# Patient Record
Sex: Female | Born: 1965
Health system: Southern US, Community
[De-identification: ages and names within clinical notes are randomized; demographics above are authoritative.]

## PROBLEM LIST (undated history)

## (undated) DIAGNOSIS — I10 Essential (primary) hypertension: Secondary | ICD-10-CM

## (undated) DIAGNOSIS — R011 Cardiac murmur, unspecified: Secondary | ICD-10-CM

## (undated) DIAGNOSIS — I499 Cardiac arrhythmia, unspecified: Secondary | ICD-10-CM

## (undated) HISTORY — PX: FACIAL RECONSTRUCTION SURGERY: SHX631

## (undated) HISTORY — DX: Essential (primary) hypertension: I10

## (undated) HISTORY — DX: Cardiac arrhythmia, unspecified: I49.9

## (undated) HISTORY — PX: CHOLECYSTECTOMY: SHX55

## (undated) HISTORY — PX: APPENDECTOMY: SHX54

## (undated) HISTORY — DX: Cardiac murmur, unspecified: R01.1

## (undated) HISTORY — PX: TONSILLECTOMY: SUR1361

---

## 1998-07-19 ENCOUNTER — Ambulatory Visit (HOSPITAL_BASED_OUTPATIENT_CLINIC_OR_DEPARTMENT_OTHER): Admission: RE | Admit: 1998-07-19 | Discharge: 1998-07-19 | Payer: Self-pay | Admitting: Otolaryngology

## 1998-11-04 ENCOUNTER — Emergency Department (HOSPITAL_COMMUNITY): Admission: EM | Admit: 1998-11-04 | Discharge: 1998-11-04 | Payer: Self-pay | Admitting: Emergency Medicine

## 1998-11-24 ENCOUNTER — Ambulatory Visit (HOSPITAL_COMMUNITY): Admission: RE | Admit: 1998-11-24 | Discharge: 1998-11-24 | Payer: Self-pay | Admitting: Internal Medicine

## 1998-11-24 ENCOUNTER — Encounter: Payer: Self-pay | Admitting: Internal Medicine

## 1999-01-12 ENCOUNTER — Ambulatory Visit (HOSPITAL_BASED_OUTPATIENT_CLINIC_OR_DEPARTMENT_OTHER): Admission: RE | Admit: 1999-01-12 | Discharge: 1999-01-12 | Payer: Self-pay | Admitting: Otolaryngology

## 2001-01-16 ENCOUNTER — Encounter: Payer: Self-pay | Admitting: Emergency Medicine

## 2001-01-16 ENCOUNTER — Emergency Department (HOSPITAL_COMMUNITY): Admission: EM | Admit: 2001-01-16 | Discharge: 2001-01-17 | Payer: Self-pay | Admitting: Emergency Medicine

## 2002-11-17 ENCOUNTER — Other Ambulatory Visit: Admission: RE | Admit: 2002-11-17 | Discharge: 2002-11-17 | Payer: Self-pay | Admitting: Internal Medicine

## 2009-10-29 ENCOUNTER — Encounter: Payer: Self-pay | Admitting: Internal Medicine

## 2009-10-29 LAB — CONVERTED CEMR LAB: Pap Smear: NORMAL

## 2010-02-08 LAB — HM PAP SMEAR: HM Pap smear: NORMAL

## 2010-12-19 ENCOUNTER — Other Ambulatory Visit: Payer: Self-pay | Admitting: Internal Medicine

## 2010-12-19 ENCOUNTER — Encounter: Payer: Self-pay | Admitting: Internal Medicine

## 2010-12-19 ENCOUNTER — Ambulatory Visit (INDEPENDENT_AMBULATORY_CARE_PROVIDER_SITE_OTHER)
Admission: RE | Admit: 2010-12-19 | Discharge: 2010-12-19 | Disposition: A | Discharge status: Home / Self Care | Payer: BC Managed Care – PPO | Source: Ambulatory Visit | Attending: Internal Medicine | Admitting: Internal Medicine

## 2010-12-19 ENCOUNTER — Ambulatory Visit (INDEPENDENT_AMBULATORY_CARE_PROVIDER_SITE_OTHER): Payer: BC Managed Care – PPO | Admitting: Internal Medicine

## 2010-12-19 ENCOUNTER — Other Ambulatory Visit: Payer: BC Managed Care – PPO

## 2010-12-19 ENCOUNTER — Encounter (INDEPENDENT_AMBULATORY_CARE_PROVIDER_SITE_OTHER): Payer: Self-pay | Admitting: *Deleted

## 2010-12-19 DIAGNOSIS — R0609 Other forms of dyspnea: Secondary | ICD-10-CM | POA: Insufficient documentation

## 2010-12-19 DIAGNOSIS — J309 Allergic rhinitis, unspecified: Secondary | ICD-10-CM

## 2010-12-19 DIAGNOSIS — G4730 Sleep apnea, unspecified: Secondary | ICD-10-CM

## 2010-12-19 DIAGNOSIS — R0989 Other specified symptoms and signs involving the circulatory and respiratory systems: Secondary | ICD-10-CM

## 2010-12-19 DIAGNOSIS — R740 Nonspecific elevation of levels of transaminase and lactic acid dehydrogenase [LDH]: Secondary | ICD-10-CM

## 2010-12-19 DIAGNOSIS — I10 Essential (primary) hypertension: Secondary | ICD-10-CM

## 2010-12-19 LAB — CBC WITH DIFFERENTIAL/PLATELET
Basophils Absolute: 0.1 10*3/uL (ref 0.0–0.1)
Basophils Relative: 1.1 % (ref 0.0–3.0)
Eosinophils Absolute: 0.2 10*3/uL (ref 0.0–0.7)
Eosinophils Relative: 2.5 % (ref 0.0–5.0)
HCT: 41.7 % (ref 36.0–46.0)
Hemoglobin: 14.4 g/dL (ref 12.0–15.0)
Lymphocytes Relative: 27.1 % (ref 12.0–46.0)
Lymphs Abs: 2.1 10*3/uL (ref 0.7–4.0)
MCHC: 34.4 g/dL (ref 30.0–36.0)
MCV: 86.3 fl (ref 78.0–100.0)
Monocytes Absolute: 0.5 10*3/uL (ref 0.1–1.0)
Monocytes Relative: 7 % (ref 3.0–12.0)
Neutro Abs: 4.7 10*3/uL (ref 1.4–7.7)
Neutrophils Relative %: 62.3 % (ref 43.0–77.0)
Platelets: 411 10*3/uL — ABNORMAL HIGH (ref 150.0–400.0)
RBC: 4.83 Mil/uL (ref 3.87–5.11)
RDW: 13.5 % (ref 11.5–14.6)
WBC: 7.6 10*3/uL (ref 4.5–10.5)

## 2010-12-19 LAB — BRAIN NATRIURETIC PEPTIDE: Pro B Natriuretic peptide (BNP): 17.2 pg/mL (ref 0.0–100.0)

## 2010-12-19 LAB — URINALYSIS, ROUTINE W REFLEX MICROSCOPIC
Ketones, ur: NEGATIVE
Leukocytes, UA: NEGATIVE
Nitrite: NEGATIVE
Specific Gravity, Urine: <=1.005 (ref 1.000–1.030)
pH: 7 (ref 5.0–8.0)

## 2010-12-19 LAB — CARDIAC PANEL
CK-MB: 1.5 ng/mL (ref 0.3–4.0)
Total CK: 73 U/L (ref 7–177)

## 2010-12-19 LAB — BASIC METABOLIC PANEL
BUN: 7 mg/dL (ref 6–23)
CO2: 26 mEq/L (ref 19–32)
Calcium: 9 mg/dL (ref 8.4–10.5)
Chloride: 107 mEq/L (ref 96–112)
Creatinine, Ser: 0.8 mg/dL (ref 0.4–1.2)
GFR: 87.15 mL/min (ref >60.00)
Glucose, Bld: 102 mg/dL — ABNORMAL HIGH (ref 70–99)
Potassium: 4 mEq/L (ref 3.5–5.1)
Sodium: 140 mEq/L (ref 135–145)

## 2010-12-19 LAB — HEPATIC FUNCTION PANEL
ALT: 15 U/L (ref 0–35)
AST: 19 U/L (ref 0–37)
Albumin: 3.7 g/dL (ref 3.5–5.2)
Alkaline Phosphatase: 82 U/L (ref 39–117)
Bilirubin, Direct: 0.2 mg/dL (ref 0.0–0.3)
Total Bilirubin: 0.4 mg/dL (ref 0.3–1.2)
Total Protein: 6.8 g/dL (ref 6.0–8.3)

## 2010-12-19 LAB — TSH: TSH: 5.53 u[IU]/mL — ABNORMAL HIGH (ref 0.35–5.50)

## 2010-12-19 LAB — LIPID PANEL
Cholesterol: 171 mg/dL (ref 0–200)
HDL: 40.6 mg/dL (ref >39.00)
LDL Cholesterol: 101 mg/dL — ABNORMAL HIGH (ref 0–99)
Total CHOL/HDL Ratio: 4
Triglycerides: 147 mg/dL (ref 0.0–149.0)
VLDL: 29.4 mg/dL (ref 0.0–40.0)

## 2010-12-20 ENCOUNTER — Encounter: Payer: Self-pay | Admitting: Internal Medicine

## 2010-12-26 NOTE — Letter (Signed)
Summary: Lipid Letter  [facility]  [address] [place], [place] [address]   Phone: [phone]  Fax: [phone]    12/20/2010  Sarah Watts [address] [place], [place]  [address]  Dear Sarah Watts:  We have carefully reviewed your last lipid profile from 12/19/2010 and the results are noted below with a summary of recommendations for lipid management.    Cholesterol:       171     Goal: <200   HDL "good" Cholesterol:   57.84     Goal: >50   LDL "bad" Cholesterol:   101     Goal: <130   Triglycerides:       147.0     Goal: <150    your thyroid level is slightly low, your platelet count is a little high but the other labs look good. the chest xray was normal. please follow-up soon.    TLC Diet (Therapeutic Lifestyle Change): Saturated Fats & Transfatty acids should be kept < 7% of total calories ***Reduce Saturated Fats Polyunstaurated Fat can be up to 10% of total calories Monounsaturated Fat Fat can be up to 20% of total calories Total Fat should be no greater than 25-35% of total calories Carbohydrates should be 50-60% of total calories Protein should be approximately 15% of total calories Fiber should be at least 20-30 grams a day ***Increased fiber may help lower LDL Total Cholesterol should be < 200mg /day Consider adding plant stanol/sterols to diet (example: Benacol spread) ***A higher intake of unsaturated fat may reduce Triglycerides and Increase HDL    Adjunctive Measures (may lower LIPIDS and reduce risk of Heart Attack) include: Aerobic Exercise (20-30 minutes 3-4 times a week) Limit Alcohol Consumption Weight Reduction Aspirin 75-81 mg a day by mouth (if not allergic or contraindicated) Dietary Fiber 20-30 grams a day by mouth     Current Medications: 1)    Flonase 50 Mcg/act Susp (Fluticasone propionate) .... 2 squirts to each nostril once daily 2)    Claritin 10 Mg Tabs (Loratadine) .... Take 1 tab once daily 3)    Tribenzor  40-5-12.5 Mg Tabs (Olmesartan-amlodipine-hctz) .... One by mouth once daily for high blood pressure  If you have any questions, please call. We appreciate being able to work with you.   Sincerely,    [facility]

## 2010-12-26 NOTE — Assessment & Plan Note (Signed)
Summary: New   Vital Signs:  Patient profile:   45 year old female Menstrual status:  regular LMP:     12/16/2010 Height:      59 inches (149.86 cm) Weight:      168.50 pounds (76.59 kg) BMI:     34.16 O2 Sat:      97 % on Room air Temp:     98.1 degrees F (36.72 degrees C) oral Pulse rate:   87 / minute Pulse rhythm:   regular Resp:     16 per minute BP supine:   180 / 92  (right arm) BP sitting:   178 / 98  (left arm) Cuff size:   regular  Vitals Entered By: Rock Nephew CMA (December 19, 2010 9:15 AM)  Nutrition Counseling: Patient's BMI is greater than 25 and therefore counseled on weight management options.  O2 Flow:  Room air CC: NP..SOB w/exhertion..Needs labs failed to get after gallbladder Sx/sls, cma, Hypertension Management, Preventive Care Is Patient Diabetic? No LMP (date): 12/16/2010     Menstrual Status regular Enter LMP: 12/16/2010 Last PAP Result normal   Primary Care Provider:  Yetta Barre  CC:  NP..SOB w/exhertion..Needs labs failed to get after gallbladder Sx/sls, cma, Hypertension Management, and Preventive Care.  History of Present Illness: New to me she tells me that she has had some DOE for more than one year.  Her BP has not been well controlled.  She was told that she had abnormal liver enzymes one year ago - no old records are available today.  Hypertension History:      She complains of dyspnea with exertion and orthopnea, but denies headache, chest pain, palpitations, PND, peripheral edema, visual symptoms, neurologic problems, syncope, and side effects from treatment.  She notes no problems with any antihypertensive medication side effects.        Positive major cardiovascular risk factors include hypertension.  Negative major cardiovascular risk factors include female age less than 54 years old, no history of diabetes or hyperlipidemia, negative family history for ischemic heart disease, and non-tobacco-user status.        Further assessment  for target organ damage reveals no history of ASHD, cardiac end-organ damage (CHF/LVH), stroke/TIA, peripheral vascular disease, renal insufficiency, or hypertensive retinopathy.     Preventive Screening-Counseling & Management  Alcohol-Tobacco     Alcohol drinks/day: 0     Alcohol Counseling: not indicated; patient does not drink     Smoking Status: quit     Smoking Cessation Counseling: yes     Smoke Cessation Stage: quit     Packs/Day: 0.5     Year Started: 1982     Year Quit: 2011     Pack years: 15     Tobacco Counseling: to remain off tobacco products  Caffeine-Diet-Exercise     Does Patient Exercise: no  Hep-HIV-STD-Contraception     Hepatitis Risk: no risk noted     HIV Risk: no risk noted     STD Risk: no risk noted      Sexual History:  currently monogamous and same sex encounters.        Drug Use:  no.        Blood Transfusions:  no.    Clinical Review Panels:  Prevention   Last Mammogram:  normal (07/29/2010)   Last Pap Smear:  normal (10/29/2009)   Medications Prior to Update: 1)  None  Current Medications (verified): 1)  Flonase 50 Mcg/act Susp (Fluticasone Propionate) .... 2 Squirts To  Each Nostril Once Daily 2)  Claritin 10 Mg Tabs (Loratadine) .... Take 1 Tab Once Daily 3)  Tribenzor 40-5-12.5 Mg Tabs (Olmesartan-Amlodipine-Hctz) .... One By Mouth Once Daily For High Blood Pressure  Allergies (verified): 1)  ! 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  The patient denies anorexia, fever, weight loss, chest pain, syncope, peripheral edema, prolonged cough, headaches, hemoptysis, abdominal pain, melena, hematochezia, severe indigestion/heartburn, hematuria, muscle weakness, suspicious skin lesions, difficulty walking, depression, unusual weight change, abnormal bleeding, and enlarged lymph nodes.   Resp:  Complains of excessive snoring, hypersomnolence, morning headaches, and shortness of breath; denies chest discomfort, chest pain with inspiration, cough, coughing up blood, pleuritic, sputum productive, and wheezing. GI:  Denies abdominal pain, bloody stools, change in bowel habits, constipation, diarrhea, indigestion, loss of appetite, nausea, vomiting, vomiting blood, and yellowish skin color.  Physical Exam  General:  alert, well-developed, well-nourished, well-hydrated, appropriate dress, normal appearance, healthy-appearing, cooperative to examination, good hygiene, and overweight-appearing.   Head:  normocephalic, atraumatic, no abnormalities observed, and no abnormalities palpated.   Eyes:  vision grossly intact, pupils equal, pupils round, and a-v nicking.   Ears:  R ear normal and L ear normal.   Nose:  External nasal examination shows no deformity or inflammation. Nasal mucosa are pink and moist without lesions or exudates. Mouth:  Oral mucosa and oropharynx without lesions or exudates.  Teeth in good repair. Neck:  supple, full ROM, no masses, no thyromegaly, no thyroid nodules or tenderness, no JVD, normal carotid upstroke, no carotid bruits, no cervical lymphadenopathy, and no neck tenderness.   Lungs:  normal respiratory  effort, no intercostal retractions, no accessory muscle use, normal breath sounds, no dullness, no fremitus, no crackles, and no wheezes.   Heart:  normal rate, regular rhythm, no murmur, no gallop, no rub, and no JVD.   Abdomen:  soft, non-tender, normal bowel sounds, no distention, no masses, no guarding, no rigidity, no rebound tenderness, no abdominal hernia, no inguinal hernia, no hepatomegaly, no splenomegaly, and abdominal scar(s).   Msk:  No deformity or scoliosis noted of thoracic or lumbar spine.   Pulses:  R and L carotid,radial,femoral,dorsalis pedis and posterior tibial pulses are full and equal bilaterally Extremities:  No clubbing, cyanosis, edema, or deformity noted with normal full range of motion of all joints.   Neurologic:  No cranial nerve deficits noted. Station and gait are normal. Plantar reflexes are down-going bilaterally. DTRs are symmetrical throughout. Sensory, motor and coordinative functions appear intact. Skin:  Intact without suspicious lesions or rashes Cervical Nodes:  No lymphadenopathy noted Axillary Nodes:  No palpable lymphadenopathy Inguinal Nodes:  No significant adenopathy Psych:  Cognition and judgment appear intact. Alert and cooperative with normal attention span and concentration.  No apparent delusions, illusions, hallucinations Additional Exam:  Her EKG shows mild LAE and some PAC's but no Q waves, LVH, or abnormal ST/T waves.   Impression & Recommendations:  Problem # 1:  SLEEP APNEA (ICD-780.57) Assessment New  Orders: Sleep Disorder Referral (Sleep Disorder) EKG w/ Interpretation (93000)  Problem # 2:  HYPERTENSION (ICD-401.9) Assessment: Deteriorated  The following medications were removed from the medication list:    Amlodipine Besylate 10 Mg Tabs (Amlodipine besylate) .Marland Kitchen... Take 1 tab once daily Her updated medication list for this problem includes:    Tribenzor 40-5-12.5 Mg Tabs (Olmesartan-amlodipine-hctz) ..... One by mouth once  daily for high blood pressure  Orders: Venipuncture (78295) TLB-Lipid Panel (80061-LIPID) TLB-BMP (Basic Metabolic Panel-BMET) (80048-METABOL) TLB-CBC Platelet - w/Differential (85025-CBCD) TLB-Hepatic/Liver Function Pnl (80076-HEPATIC) TLB-TSH (Thyroid Stimulating Hormone) (84443-TSH) TLB-BNP (B-Natriuretic Peptide) (83880-BNPR) TLB-Cardiac Panel (62130_86578-IONG) TLB-Udip w/ Micro (81001-URINE) EKG w/ Interpretation (93000)  BP today: 178/98  10 Yr Risk Heart Disease: Not enough information  Problem # 3:  DYSPNEA ON EXERTION (ICD-786.09) Assessment: New will look for lung pathology, organic disease, CHF, etc Her updated medication list for this problem includes:    Tribenzor 40-5-12.5 Mg Tabs (Olmesartan-amlodipine-hctz) ..... One by mouth once daily for high blood pressure  Orders: Venipuncture (29528) TLB-Lipid Panel (80061-LIPID) TLB-BMP (Basic Metabolic Panel-BMET) (80048-METABOL) TLB-CBC Platelet - w/Differential (85025-CBCD) TLB-Hepatic/Liver Function Pnl (80076-HEPATIC) TLB-TSH (Thyroid Stimulating Hormone) (84443-TSH) TLB-BNP (B-Natriuretic Peptide) (83880-BNPR) TLB-Cardiac Panel (41324_40102-VOZD) TLB-Udip w/ Micro (81001-URINE) T-2 View CXR (71020TC) EKG w/ Interpretation (93000)  Problem # 4:  TRANSAMINASES, SERUM, ELEVATED (ICD-790.4) Assessment: Unchanged  Orders: Venipuncture (66440) TLB-Lipid Panel (80061-LIPID) TLB-BMP (Basic Metabolic Panel-BMET) (80048-METABOL) TLB-CBC Platelet - w/Differential (85025-CBCD) TLB-Hepatic/Liver Function Pnl (80076-HEPATIC) TLB-TSH (Thyroid Stimulating Hormone) (84443-TSH) TLB-BNP (B-Natriuretic Peptide) (83880-BNPR) TLB-Cardiac Panel (34742_59563-OVFI) TLB-Udip w/ Micro (81001-URINE) T-Hepatitis C Antibody (43329-51884)  Complete Medication List: 1)  Flonase 50 Mcg/act Susp (Fluticasone propionate) .... 2 squirts to each nostril once daily 2)  Claritin 10 Mg Tabs (Loratadine) .... Take 1 tab once daily 3)   Tribenzor 40-5-12.5 Mg Tabs (Olmesartan-amlodipine-hctz) .... One by mouth once daily for high blood pressure  Hypertension Assessment/Plan:      The patient's hypertensive risk group is category A: No risk factors and no target organ damage.  Today's blood pressure is 178/98.  Her blood pressure goal is < 140/90.  PAP Screening:    Last PAP smear:  10/29/2009    Reviewed PAP smear recommendations:  patient refuses understanding risks of delayed diagnosis  Mammogram Screening:    Last Mammogram:  07/29/2010  Osteoporosis Risk Assessment:  Risk Factors for Fracture or Low Bone Density:   Race (White or Asian):     yes   Smoking status:       quit  Patient Instructions: 1)  Please schedule a follow-up appointment in 1 month. 2)  It is important that you exercise regularly at least 20 minutes 5 times a week. If you develop chest pain, have severe difficulty breathing, or feel very tired , stop exercising immediately and seek medical attention. 3)  You need to lose weight. Consider a lower calorie diet and regular exercise.  4)  Check your Blood Pressure regularly. If it is above 140/90: you should make an appointment. Prescriptions: TRIBENZOR 40-5-12.5 MG TABS (OLMESARTAN-AMLODIPINE-HCTZ) One by mouth once daily for high blood pressure  #56 x 0   Entered and Authorized by:   Etta Grandchild MD   Signed by:   Etta Grandchild MD on 12/19/2010   Method  used:   Samples Given   RxID:   [id number]    Orders Added: 1)  Venipuncture [36415] 2)  TLB-Lipid Panel [80061-LIPID] 3)  TLB-BMP (Basic Metabolic Panel-BMET) [80048-METABOL] 4)  TLB-CBC Platelet - w/Differential [85025-CBCD] 5)  TLB-Hepatic/Liver Function Pnl [80076-HEPATIC] 6)  TLB-TSH (Thyroid Stimulating Hormone) [84443-TSH] 7)  TLB-BNP (B-Natriuretic Peptide) [83880-BNPR] 8)  TLB-Cardiac Panel [82550_82553-CARD] 9)  TLB-Udip w/ Micro [81001-URINE] 10)  T-Hepatitis C Antibody [86578-46962] 11)  Sleep Disorder Referral  [Sleep Disorder] 12)  T-2 View CXR [71020TC] 13)  EKG w/ Interpretation [93000] 14)  New Patient Level V [99205]     Preventive Care Screening  Mammogram:    Date:  07/29/2010    Results:  normal   Pap Smear:    Date:  10/29/2009    Results:  normal

## 2010-12-26 NOTE — Letter (Signed)
Summary: [facility]  [address] [place], [place] [address]   Phone: [phone]  Fax: [phone]    Today's Date: December 19, 2010  Name of Patient: Sarah Watts  The above named patient had a medical visit today at:   9 am .  Please take this into consideration when reviewing the time away from work/school.    Special Instructions:  [ X ] None  [  ] To be off the remainder of today, returning to the normal work / school schedule tomorrow.  [  ] To be off until the next scheduled appointment on ______________________.  [  ] Other ________________________________________________________________ ________________________________________________________________________   Sincerely yours,   Sanda Linger MD

## 2011-01-09 ENCOUNTER — Institutional Professional Consult (permissible substitution) (INDEPENDENT_AMBULATORY_CARE_PROVIDER_SITE_OTHER): Payer: BC Managed Care – PPO | Admitting: Pulmonary Disease

## 2011-01-09 ENCOUNTER — Encounter: Payer: Self-pay | Admitting: Pulmonary Disease

## 2011-01-09 DIAGNOSIS — G4733 Obstructive sleep apnea (adult) (pediatric): Secondary | ICD-10-CM | POA: Insufficient documentation

## 2011-01-18 ENCOUNTER — Encounter: Payer: Self-pay | Admitting: Internal Medicine

## 2011-01-18 ENCOUNTER — Other Ambulatory Visit: Payer: Self-pay | Admitting: *Deleted

## 2011-01-18 ENCOUNTER — Other Ambulatory Visit (INDEPENDENT_AMBULATORY_CARE_PROVIDER_SITE_OTHER): Payer: BC Managed Care – PPO

## 2011-01-18 ENCOUNTER — Ambulatory Visit (INDEPENDENT_AMBULATORY_CARE_PROVIDER_SITE_OTHER): Payer: BC Managed Care – PPO | Admitting: Internal Medicine

## 2011-01-18 VITALS — BP 148/90 | HR 96 | Temp 98.0°F | Resp 14 | Ht 59.0 in | Wt 166.0 lb

## 2011-01-18 DIAGNOSIS — J209 Acute bronchitis, unspecified: Secondary | ICD-10-CM | POA: Insufficient documentation

## 2011-01-18 DIAGNOSIS — E039 Hypothyroidism, unspecified: Secondary | ICD-10-CM

## 2011-01-18 DIAGNOSIS — I10 Essential (primary) hypertension: Secondary | ICD-10-CM

## 2011-01-18 LAB — TSH: TSH: 4.17 u[IU]/mL (ref 0.35–5.50)

## 2011-01-18 MED ORDER — MOXIFLOXACIN HCL 400 MG PO TABS: 400.0000 mg | ORAL_TABLET | Freq: Every day | ORAL | Status: AC

## 2011-01-18 MED ORDER — HYDROCOD POLST-CPM POLST ER 10-8 MG PO CP12: 1.0000 | ORAL_CAPSULE | Freq: Two times a day (BID) | ORAL | Status: DC | PRN

## 2011-01-18 MED ORDER — PROMETHAZINE-DM 6.25-15 MG/5ML PO SYRP: 5.0000 mL | ORAL_SOLUTION | Freq: Four times a day (QID) | ORAL | Status: DC | PRN

## 2011-01-18 NOTE — Telephone Encounter (Signed)
She is allergic to codeine

## 2011-01-18 NOTE — Patient Instructions (Signed)
Bronchitis Bronchitis is the body's way of reacting to injury and/or infection (inflammation) of the bronchi. Bronchi are the air tubes that extend from the windpipe into the lungs. If the inflammation becomes severe, it may cause shortness of breath.  CAUSES Inflammation may be caused by:  A virus.   Germs (bacteria).   Dust.   Allergens.   Pollutants and many other irritants.  The cells lining the bronchial tree are covered with tiny hairs (cilia). These constantly beat upward, away from the lungs, toward the mouth. This keeps the lungs free of pollutants. When these cells become too irritated and are unable to do their job, mucus begins to develop. This causes the characteristic cough of bronchitis. The cough clears the lungs when the cilia are unable to do their job. Without either of these protective mechanisms, the mucus would settle in the lungs. Then you would develop pneumonia. Smoking is a common cause of bronchitis and can contribute to pneumonia. Stopping this habit is the single most important thing you can do to help yourself. TREATMENT  Your caregiver may prescribe an antibiotic if the cough is caused by bacteria. Also, medicines that open up your airways make it easier to breathe. Your caregiver may also recommend or prescribe an expectorant. It will loosen the mucus to be coughed up. Only take over-the-counter or prescription medicines for pain, discomfort, or fever as directed by your caregiver.   Removing whatever causes the problem (smoking, for example) is critical to preventing the problem from getting worse.   Cough suppressants may be prescribed for relief of cough symptoms.   Inhaled medicines may be prescribed to help with symptoms now and to help prevent problems from returning.   For those with recurrent (chronic) bronchitis, there may be a need for steroid medicines.  SEEK IMMEDIATE MEDICAL CARE IF:  During treatment, you develop more pus-like mucus  (purulent sputum).   You or your child has an oral temperature above 100.5, not controlled by medicine.   Your baby is older than 3 months with a rectal temperature of 102 F (38.9 C) or higher.   Your baby is 3 months old or younger with a rectal temperature of 100.4 F (38 C) or higher.   You become progressively more ill.   You have increased difficulty breathing, wheezing, or shortness of breath.  It is necessary to seek immediate medical care if you are elderly or sick from any other disease. MAKE SURE YOU:  Understand these instructions.   Will watch your condition.   Will get help right away if you are not doing well or get worse.  Document Released: 10/15/2005 Document Re-Released: 01/09/2010 [employer] Patient Information 2011 [employer]. 

## 2011-01-18 NOTE — Telephone Encounter (Signed)
Cough med given today is too expensive. Pharm suggested cheritussin AC #4oz as alt. OK?

## 2011-01-18 NOTE — Progress Notes (Deleted)
  Subjective:    Patient ID: Sarah Watts, female    DOB: 18-Jul-1966, 45 y.o.   MRN: [id number]  HPI    Review of Systems     Objective:   Physical Exam        Assessment & Plan:

## 2011-01-18 NOTE — Progress Notes (Signed)
Subjective:     Sarah Watts is a 45 y.o. female who presents for evaluation of symptoms of a URI. Symptoms include congestion, cough described as productive, no  fever, post nasal drip, productive cough with  yellow colored sputum and purulent nasal discharge. Onset of symptoms was 7 days ago, and has been unchanged since that time. Treatment to date: decongestants.  The following portions of the patient's history were reviewed and updated as appropriate: allergies, current medications, past family history, past medical history, past social history, past surgical history and problem list.  Review of Systems Constitutional: negative for anorexia, chills, fatigue, fevers, malaise, night sweats, sweats and weight loss Respiratory: negative for dyspnea on exertion, hemoptysis, pleurisy/chest pain, stridor and wheezing  Past Medical History  Diagnosis Date  . Allergic rhinitis   . HTN (hypertension)   . Heart murmur   . Irregular heartbeat    Past Surgical History  Procedure Date  . Appendectomy   . Cholecystectomy   . Tonsillectomy   . Cesarean section   . Facial reconstruction surgery     Left, due to birth defect    does not have a smoking history on file. She does not have any smokeless tobacco history on file. She reports that she does not drink alcohol or use illicit drugs. family history includes Diabetes in her other; Hypertension in her other; and Prostate cancer in her other. Allergies  Allergen Reactions  . Codeine     REACTION: tachycardia  . Shellfish Allergy    Objective:    BP 148/90  Pulse 96  Temp(Src) 98 F (36.7 C) (Oral)  Resp 14  Ht 4\' 11"  (1.499 m)  Wt 166 lb (75.297 kg)  BMI 33.53 kg/m2  SpO2 98%  General Appearance:    Alert, cooperative, no distress, appears stated age  Head:    Normocephalic, without obvious abnormality, atraumatic  Eyes:    PERRL, conjunctiva/corneas clear, EOM's intact, fundi    benign, both eyes  Ears:    Normal TM's and  external ear canals, both ears  Nose:   Nares normal, septum midline, mucosa normal, no drainage    or sinus tenderness  Throat:   Lips, mucosa, and tongue normal; teeth and gums normal  Neck:   Supple, symmetrical, trachea midline, no adenopathy;    thyroid:  no enlargement/tenderness/nodules; no carotid   bruit or JVD  Back:     Symmetric, no curvature, ROM normal, no CVA tenderness  Lungs:     Clear to auscultation bilaterally, respirations unlabored  Chest Wall:    No tenderness or deformity   Heart:    Regular rate and rhythm, S1 and S2 normal, no murmur, rub   or gallop  Breast Exam:    No tenderness, masses, or nipple abnormality  Abdomen:     Soft, non-tender, bowel sounds active all four quadrants,    no masses, no organomegaly        Extremities:   Extremities normal, atraumatic, no cyanosis or edema  Pulses:   2+ and symmetric all extremities  Skin:   Skin color, texture, turgor normal, no rashes or lesions  Lymph nodes:   Cervical, supraclavicular, and axillary nodes normal  Neurologic:   CNII-XII intact, normal strength, sensation and reflexes    throughout    Lab Results  Component Value Date   TSH 5.53* 12/19/2010    Assessment:    bronchitis and viral upper respiratory illness  Her recent labs showed a slightly elevated TSH  so I will repeat that today  Plan:    Discussed diagnosis and treatment of URI. Suggested symptomatic OTC remedies. Nasal saline spray for congestion. Follow up as needed.  Started on avelox and tussicaps

## 2011-01-25 NOTE — Assessment & Plan Note (Signed)
Summary: consult for possible osa    Visit Type:  Initial Consult Copy to:  Sanda Linger Primary Provider/Referring Provider:  Sanda Linger  CC:  Sleep Consult.  Pt c/o snoring while sleeping and difficulty staying asleep. Marland Kitchen  History of Present Illness: The pt is a 44y/o female who I have been asked to see for possible osa.  Her history is significant for the following: +loud snoring, abnormal breathing pattern during sleep, restless sleep, nonrestorative sleep +sleep pressure while at work on breaks or working at [employer].  dozes with tv/movies. +sleepiness with long distance driving. weight up 20 pounds last 2 yrs, epworth score 7.   sleeps  1am to 10am  Medications Prior to Update: 1)  Flonase 50 Mcg/act Susp (Fluticasone Propionate) .... 2 Squirts To Each Nostril Once Daily 2)  Claritin 10 Mg Tabs (Loratadine) .... Take 1 Tab Once Daily 3)  Tribenzor 40-5-12.5 Mg Tabs (Olmesartan-Amlodipine-Hctz) .... One By Mouth Once Daily For High Blood Pressure  Allergies (verified): 1)  ! Codeine 2)  ! * Shellfish  Past History:  Past Medical History: Reviewed history from 12/19/2010 and no changes required. Allergic rhinitis Hypertension Heart murmur + irregular heartbeat  Past Surgical History: Appendectomy  1985 Cholecystectomy Feb 2011 Tonsillectomy  2003 Caesarean section  1985 left facial surgery for birth defect  2003  Family History: Reviewed history from 12/19/2010 and no changes required. Family History Diabetes 1st degree relative Family History Hypertension Family History of Prostate CA 1st degree relative <50 allergies: mother, father, sister asthma: sister  Social History: Reviewed history from 12/19/2010 and no changes required. Occupation: Arboriculturist at [employer] pt is single. pt has children. Domestic Partner - female  Elnita Maxwell pt is a former smoker.  started at age 33. 1/2 ppd.  quit Jan 2011. Alcohol use-no Drug use-no Regular exercise-no  Review  of Systems       The patient complains of shortness of breath with activity, irregular heartbeats, weight change, tooth/dental problems, nasal congestion/difficulty breathing through nose, and joint stiffness or pain.  The patient denies shortness of breath at rest, productive cough, non-productive cough, coughing up blood, chest pain, acid heartburn, indigestion, loss of appetite, abdominal pain, difficulty swallowing, sore throat, headaches, sneezing, itching, ear ache, anxiety, depression, hand/feet swelling, rash, change in color of mucus, and fever.    Vital Signs:  Patient profile:   45 year old female Menstrual status:  regular Height:      59 inches Weight:      168 pounds BMI:     34.05 O2 Sat:      98 % on Room air Temp:     98.3 degrees F oral Pulse rate:   82 / minute BP sitting:   122 / 68  (left arm) Cuff size:   regular  Vitals Entered By: Arman Filter LPN (January 09, 2011 10:46 AM)  O2 Flow:  Room air CC: Sleep Consult.  Pt c/o snoring while sleeping and difficulty staying asleep.  Comments Medications reviewed with patient Arman Filter LPN  January 09, 2011 10:50 AM    Physical Exam  General:  ow female in nad  Eyes:  PERRLA and EOMI.   Nose:  mild septal deviation to left with mild narrowing Mouth:  normal palate and uvula, no exudates Neck:  no jvd, tmg, LN Lungs:  clear to auscultation  Heart:  rrr, no mrg  Abdomen:  soft and nontender, bs+ Extremities:  no edema or cyanosis  pulses intact distally  Neurologic:  alert and oriented, moves all 4    Impression & Recommendations:  Problem # 1:  OBSTRUCTIVE SLEEP APNEA (ICD-327.23) the pt's history is very suggestive of clinically significant osa.  I have recommended a sleep study for evaluation, and she is agreeable.  Will set up for home sleep testing, and arrange f/u when results are available.    Other Orders: Consultation Level IV (29528) Misc. Referral (Misc. Ref)  Patient Instructions: 1)  will  schedule for home sleep testing to evaluate for sleep apnea 2)  work on weight loss 3)  will arrange followup once results are available.

## 2011-02-07 ENCOUNTER — Ambulatory Visit (HOSPITAL_BASED_OUTPATIENT_CLINIC_OR_DEPARTMENT_OTHER): Payer: BC Managed Care – PPO | Attending: Pulmonary Disease

## 2011-02-07 DIAGNOSIS — G4710 Hypersomnia, unspecified: Secondary | ICD-10-CM | POA: Insufficient documentation

## 2011-02-07 DIAGNOSIS — G4730 Sleep apnea, unspecified: Secondary | ICD-10-CM | POA: Insufficient documentation

## 2011-02-07 DIAGNOSIS — R0609 Other forms of dyspnea: Secondary | ICD-10-CM | POA: Insufficient documentation

## 2011-02-07 DIAGNOSIS — R0989 Other specified symptoms and signs involving the circulatory and respiratory systems: Secondary | ICD-10-CM | POA: Insufficient documentation

## 2011-02-12 ENCOUNTER — Other Ambulatory Visit: Payer: Self-pay | Admitting: *Deleted

## 2011-02-12 MED ORDER — OLMESARTAN-AMLODIPINE-HCTZ 40-5-12.5 MG PO TABS: 1.0000 | ORAL_TABLET | Freq: Every day | ORAL | Status: DC

## 2011-02-13 DIAGNOSIS — G4710 Hypersomnia, unspecified: Secondary | ICD-10-CM

## 2011-02-13 DIAGNOSIS — R0989 Other specified symptoms and signs involving the circulatory and respiratory systems: Secondary | ICD-10-CM

## 2011-02-13 DIAGNOSIS — R0609 Other forms of dyspnea: Secondary | ICD-10-CM

## 2011-02-13 DIAGNOSIS — G4730 Sleep apnea, unspecified: Secondary | ICD-10-CM

## 2011-02-14 ENCOUNTER — Telehealth: Payer: Self-pay | Admitting: Pulmonary Disease

## 2011-02-14 ENCOUNTER — Encounter: Payer: Self-pay | Admitting: Pulmonary Disease

## 2011-02-14 NOTE — Telephone Encounter (Signed)
[id number] 

## 2011-02-14 NOTE — Procedures (Signed)
[name], Sarah Watts              ACCOUNT NO.:  [id number]  MEDICAL RECORD NO.:  [id number]          PATIENT TYPE:  OUT  LOCATION:  SLEEP CENTER                 FACILITY:  [facility]  PHYSICIAN:  Barbaraann Share, MD,FCCPDATE OF BIRTH:  23-Oct-1965  DATE OF STUDY:  02/07/2011                           NOCTURNAL POLYSOMNOGRAM  REFERRING PHYSICIAN:  Gloria Lambertson M Cyle Kenyon  INDICATION FOR STUDY:  Hypersomnia with sleep apnea.  EPWORTH SLEEPINESS SCORE:  5.  MEDICATIONS:  SLEEP ARCHITECTURE:  The patient had a total sleep time of 198 minutes with very little slow wave sleep and only 32 minutes of REM.  Sleep onset latency was prolonged at 106 minutes, and REM onset was prolonged at 199 minutes.  Sleep efficiency was poor at 50%.  RESPIRATORY DATA:  The patient was found to have 9 apneas and no obstructive hypopneas for an apnea-hypopnea index of only 3 events per hour.  However, she was found to have large numbers of respiratory effort related arousals which resulted in significant disruption of sleep.  This gave her a respiratory disturbance index of 40 events per hour.  The events were worse in the supine position and there was moderate to loud snoring noted throughout.  OXYGEN DATA:  There was O2 desaturation as low as 93% with the patient's obstructive events.  CARDIAC DATA:  No clinically significant arrhythmias were noted.  MOVEMENT-PARASOMNIA:  The patient had no significant leg jerks or other abnormal behaviors seen.  IMPRESSIONS-RECOMMENDATIONS:  Small numbers of classic obstructive events which do not meet the AHI criteria for the obstructive sleep apnea syndrome.  However, the patient had large numbers of respiratory effort related arousals with significant sleep disruption and a respiratory disturbance index of 40 events per hour.  There was O2 desaturation as low as 93%.  Treatment for this degree of sleep disordered breathing can include a trial of weight loss alone,  upper airway surgery, dental appliance, and also CPAP.  Clinical correlation is suggested.     Barbaraann Share, MD,FCCP Diplomate, American Board of Sleep Medicine Electronically Signed    [facility]/MED[facility]  D:  02/13/2011 20:35:07  T:  02/14/2011 06:31:20  Job:  [id number]

## 2011-02-15 ENCOUNTER — Encounter: Payer: Self-pay | Admitting: Pulmonary Disease

## 2011-02-15 ENCOUNTER — Ambulatory Visit (INDEPENDENT_AMBULATORY_CARE_PROVIDER_SITE_OTHER): Payer: BC Managed Care – PPO | Admitting: Pulmonary Disease

## 2011-02-15 VITALS — BP 138/72 | HR 77 | Temp 97.9°F | Ht 60.0 in | Wt 168.0 lb

## 2011-02-15 DIAGNOSIS — G4733 Obstructive sleep apnea (adult) (pediatric): Secondary | ICD-10-CM

## 2011-02-15 NOTE — Assessment & Plan Note (Signed)
The pt has definite sleep disordered breathing by her sleep study that represents the upper airway resistance syndrome.  She is symptomatic, and would benefit from treatment to help with daytime symptoms.  I have discussed treatment options with her, including a trial of weight loss alone vs a trial of cpap or dental appliance while working on weight loss. She would like to try cpap if insurance approves.

## 2011-02-15 NOTE — Progress Notes (Signed)
  Subjective:    Patient ID: Sarah Watts, female    DOB: [birth year], 45 y.o.   MRN: [id number]  HPI The pt comes in today for f/u of her recent sleep study.  She was found to have an AHI 3/hr, but very large numbers of RERA's with definite sleep disruption for RDI 40/hr.  I have reviewed the study with her in detail, and answered all of her questions.    Review of Systems  Constitutional: Negative for fever and unexpected weight change.  HENT: Positive for congestion. Negative for ear pain, nosebleeds, sore throat, rhinorrhea, sneezing, trouble swallowing, dental problem, postnasal drip and sinus pressure.   Eyes: Negative for redness and itching.  Respiratory: Negative for cough, chest tightness, shortness of breath and wheezing.   Cardiovascular: Negative for palpitations and leg swelling.  Gastrointestinal: Negative for nausea and vomiting.  Genitourinary: Negative for dysuria.  Musculoskeletal: Negative for joint swelling.  Skin: Negative for rash.  Neurological: Negative for headaches.  Hematological: Does not bruise/bleed easily.  Psychiatric/Behavioral: Negative for dysphoric mood. The patient is not nervous/anxious.        Objective:   Physical Exam Obese female in nad LE without edema, no cyanosis  Appears mildly sleepy, oriented, moves all 4        Assessment & Plan:

## 2011-02-15 NOTE — Patient Instructions (Signed)
Will see if insurance will approve cpap trial for your sleep disordered breathing Work on weight loss If you go on the cpap, I need to see you 5 weeks after starting.  You will need to call and make apptm once getting cpap

## 2011-02-15 NOTE — Telephone Encounter (Signed)
Pt was seen today to discuss sleep study results.

## 2011-02-23 ENCOUNTER — Encounter: Payer: Self-pay | Admitting: Pulmonary Disease

## 2011-02-26 ENCOUNTER — Encounter: Payer: Self-pay | Admitting: Pulmonary Disease

## 2011-03-12 ENCOUNTER — Encounter: Payer: Self-pay | Admitting: Pulmonary Disease

## 2011-05-14 ENCOUNTER — Encounter: Payer: Self-pay | Admitting: Internal Medicine

## 2011-05-14 DIAGNOSIS — Z0000 Encounter for general adult medical examination without abnormal findings: Secondary | ICD-10-CM | POA: Insufficient documentation

## 2011-05-15 ENCOUNTER — Ambulatory Visit (INDEPENDENT_AMBULATORY_CARE_PROVIDER_SITE_OTHER): Payer: BC Managed Care – PPO | Admitting: Internal Medicine

## 2011-05-15 ENCOUNTER — Encounter: Payer: Self-pay | Admitting: Internal Medicine

## 2011-05-15 VITALS — BP 150/82 | HR 88 | Temp 98.5°F | Ht 60.0 in | Wt 168.4 lb

## 2011-05-15 DIAGNOSIS — J309 Allergic rhinitis, unspecified: Secondary | ICD-10-CM

## 2011-05-15 DIAGNOSIS — L03211 Cellulitis of face: Secondary | ICD-10-CM

## 2011-05-15 DIAGNOSIS — I10 Essential (primary) hypertension: Secondary | ICD-10-CM

## 2011-05-15 DIAGNOSIS — L0201 Cutaneous abscess of face: Secondary | ICD-10-CM

## 2011-05-15 MED ORDER — FLUTICASONE PROPIONATE 50 MCG/ACT NA SUSP: 2.0000 | Freq: Every day | NASAL | Status: DC

## 2011-05-15 MED ORDER — DOXYCYCLINE HYCLATE 100 MG PO TABS: 100.0000 mg | ORAL_TABLET | Freq: Two times a day (BID) | ORAL | Status: AC

## 2011-05-15 NOTE — Progress Notes (Signed)
Subjective:    Patient ID: Sarah Watts, female    DOB: Nov 27, 1965, 45 y.o.   MRN: [id number]  HPI  Here with acute onset 2 days left facial pain, swelling and tender with slight pus like d/c this am and some discomfort at the left angle of the jaw but no swelling;  ? Some low grade temp, but no high fever, chills, ST, cough, HA and Pt denies chest pain, increased sob or doe, wheezing, orthopnea, PND, increased LE swelling, palpitations, dizziness or syncope.  Pt denies new neurological symptoms such as new headache, or facial or extremity weakness or numbness   Pt denies polydipsia, polyuria.  Does have several wks ongoing nasal allergy symptoms with clear congestion, itch and sneeze, without fever, pain, ST, cough or wheezing, and imrpoved on current meds.  Overall good compliance with treatment, and good medicine tolerability. Past Medical History  Diagnosis Date  . Allergic rhinitis   . HTN (hypertension)   . Heart murmur   . Irregular heartbeat    Past Surgical History  Procedure Date  . Appendectomy   . Cholecystectomy   . Tonsillectomy   . Cesarean section   . Facial reconstruction surgery     Left, due to birth defect    reports that she quit smoking about 15 months ago. She does not have any smokeless tobacco history on file. She reports that she does not drink alcohol or use illicit drugs. family history includes Allergies in her father, mother, and sister; Asthma in her sister; Diabetes in her other; Hypertension in her other; and Prostate cancer in her other. Allergies  Allergen Reactions  . Codeine     REACTION: tachycardia  . Shellfish Allergy    Current Outpatient Prescriptions on File Prior to Visit  Medication Sig Dispense Refill  . fluticasone (FLONASE) 50 MCG/ACT nasal spray 2 sprays by Nasal route daily.        Marland Kitchen loratadine (CLARITIN) 10 MG tablet Take 10 mg by mouth daily.        . Olmesartan-Amlodipine-HCTZ (TRIBENZOR) 40-5-12.5 MG TABS Take 1 tablet by  mouth daily.  90 tablet  1  . promethazine-dextromethorphan (PROMETHAZINE-DM) 6.25-15 MG/5ML syrup Take 5 mLs by mouth 4 (four) times daily as needed for cough.  118 mL  1    Review of Systems Review of Systems  Constitutional: Negative for diaphoresis and unexpected weight change.  HENT: Negative for drooling and tinnitus.   Eyes: Negative for photophobia and visual disturbance.  Respiratory: Negative for choking and stridor.   Gastrointestinal: Negative for vomiting and blood in stool.      Objective:   Physical Exam BP 150/82  Pulse 88  Temp(Src) 98.5 F (36.9 C) (Oral)  Ht 5' (1.524 m)  Wt 168 lb 6 oz (76.374 kg)  BMI 32.88 kg/m2  SpO2 98%  LMP 04/30/2011 Physical Exam  VS noted, nontoxic, not ill appearing Constitutional: Pt appears well-developed and well-nourished.  HENT: Head: Normocephalic.  Right Ear: External ear normal.  Left Ear: External ear normal.  Bilat tm's clear, pharynx benign, no gum swelling or tender, sinus nontender Left facial parotid area with 1/2 cm area mild tender, swelling without drainage, red streaks No significant submandib LA or at angle of jaw Eyes: Conjunctivae and EOM are normal. Pupils are equal, round, and reactive to light.  Neck: Normal range of motion. Neck supple.  Cardiovascular: Normal rate and regular rhythm.   Pulmonary/Chest: Effort normal and breath sounds normal.  Neurological: Pt is alert. No  cranial nerve deficit.  Skin: Skin is warm. No erythema.  Psychiatric: Pt behavior is normal. Thought content normal.         Assessment & Plan:

## 2011-05-15 NOTE — Patient Instructions (Signed)
Take all new medications as prescribed Continue all other medications as before  

## 2011-05-15 NOTE — Assessment & Plan Note (Signed)
Left parotid area, mild without further drainage or fluctuance, small - for doxy course,  to f/u any worsening symptoms or concerns

## 2011-05-15 NOTE — Assessment & Plan Note (Signed)
stable overall by hx and exam, most recent data reviewed with pt per pt request, and pt to continue medical treatment as before  Lab Results  Component Value Date   WBC 7.6 12/19/2010   HGB 14.4 12/19/2010   HCT 41.7 12/19/2010   PLT 411.0* 12/19/2010   CHOL 171 12/19/2010   TRIG 147.0 12/19/2010   HDL 40.60 12/19/2010   ALT 15 12/19/2010   AST 19 12/19/2010   NA 140 12/19/2010   K 4.0 12/19/2010   CL 107 12/19/2010   CREATININE 0.8 12/19/2010   BUN 7 12/19/2010   CO2 26 12/19/2010   TSH 4.17 01/18/2011

## 2011-05-15 NOTE — Assessment & Plan Note (Signed)
Mild incr overall likely due to current illness,  to f/u any worsening symptoms or concerns, Continue all other medications as before  BP Readings from Last 3 Encounters:  05/15/11 150/82  02/15/11 138/72  01/18/11 148/90

## 2011-06-21 ENCOUNTER — Other Ambulatory Visit: Payer: Self-pay | Admitting: Internal Medicine

## 2012-01-30 ENCOUNTER — Encounter: Payer: Self-pay | Admitting: Internal Medicine

## 2012-01-30 ENCOUNTER — Ambulatory Visit (INDEPENDENT_AMBULATORY_CARE_PROVIDER_SITE_OTHER): Payer: BC Managed Care – PPO | Admitting: Internal Medicine

## 2012-01-30 ENCOUNTER — Ambulatory Visit (INDEPENDENT_AMBULATORY_CARE_PROVIDER_SITE_OTHER)
Admission: RE | Admit: 2012-01-30 | Discharge: 2012-01-30 | Disposition: A | Discharge status: Home / Self Care | Payer: BC Managed Care – PPO | Source: Ambulatory Visit | Attending: Internal Medicine | Admitting: Internal Medicine

## 2012-01-30 VITALS — BP 138/78 | HR 80 | Temp 98.0°F | Resp 16 | Wt 175.5 lb

## 2012-01-30 DIAGNOSIS — M25569 Pain in unspecified knee: Secondary | ICD-10-CM

## 2012-01-30 DIAGNOSIS — M1710 Unilateral primary osteoarthritis, unspecified knee: Secondary | ICD-10-CM

## 2012-01-30 DIAGNOSIS — M25561 Pain in right knee: Secondary | ICD-10-CM

## 2012-01-30 MED ORDER — NAPROXEN-ESOMEPRAZOLE 500-20 MG PO TBEC: 1.0000 | DELAYED_RELEASE_TABLET | Freq: Two times a day (BID) | ORAL | Status: DC

## 2012-01-30 NOTE — Assessment & Plan Note (Signed)
She got an injection today, I have asked her to start PT, xray today to look for spurs/djd, etc try nsaids as well

## 2012-01-30 NOTE — Patient Instructions (Signed)
Degenerative Arthritis  You have osteoarthritis. This is the wear and tear arthritis that comes with aging. It is also called degenerative arthritis. This is common in people past middle age. It is caused by stress on the joints. The large weight bearing joints of the lower extremities are most often affected. The knees, hips, back, neck, and hands can become painful, swollen, and stiff. This is the most common type of arthritis. It comes on with age, carrying too much weight, or from an injury.  Treatment includes resting the sore joint until the pain and swelling improve. Crutches or a walker may be needed for severe flares. Only take over-the-counter or prescription medicines for pain, discomfort, or fever as directed by your caregiver. Local heat therapy may improve motion. Cortisone shots into the joint are sometimes used to reduce pain and swelling during flares.  Osteoarthritis is usually not crippling and progresses slowly. There are things you can do to decrease pain:  · Avoid high impact activities.  · Exercise regularly.  · Low impact exercises such as walking, biking and swimming help to keep the muscles strong and keep normal joint function.  · Stretching helps to keep your range of motion.  · Lose weight if you are overweight. This reduces joint stress.  In severe cases when you have pain at rest or increasing disability, joint surgery may be helpful. See your caregiver for follow-up treatment as recommended.   SEEK IMMEDIATE MEDICAL CARE IF:   · You have severe joint pain.  · Marked swelling and redness in your joint develops.  · You develop a high fever.  Document Released: 10/15/2005 Document Revised: 10/04/2011 Document Reviewed: 03/17/2007  Exit[employer]® Patient Information ©2012 [employer].

## 2012-01-30 NOTE — Assessment & Plan Note (Signed)
I think she has a small effusion from early DJD, today I have asked her to get an xray done and will start her on some nsaids

## 2012-01-30 NOTE — Progress Notes (Signed)
  Subjective:    Patient ID: Sarah Watts, female    DOB: 1966/09/01, 46 y.o.   MRN: [id number]  Arthritis Presents for initial visit. The disease course has been worsening. The condition has lasted for 3 weeks. She complains of pain, stiffness and joint swelling. She reports no joint warmth. Affected locations include the right knee. Her pain is at a severity of 4/10. Associated symptoms include pain while resting. Pertinent negatives include no diarrhea, dry eyes, dry mouth, dysuria, fatigue, fever, pain at night, rash, Raynaud's syndrome, uveitis or weight loss. Her past medical history is significant for osteoarthritis. Her family medical history includes family history of osteoarthritis. Past treatments include nothing. Factors aggravating her arthritis include activity, climbing stairs and descending stairs.      Review of Systems  Constitutional: Negative.  Negative for fever, weight loss and fatigue.  HENT: Negative.   Eyes: Negative.   Respiratory: Negative.   Cardiovascular: Negative.   Gastrointestinal: Negative.  Negative for diarrhea.  Genitourinary: Negative.  Negative for dysuria.  Musculoskeletal: Positive for joint swelling, arthralgias (right knee), arthritis and stiffness. Negative for myalgias, back pain and gait problem.  Skin: Negative for rash.  Neurological: Negative.   Hematological: Negative.  Negative for adenopathy. Does not bruise/bleed easily.  Psychiatric/Behavioral: Negative.        Objective:   Physical Exam  Musculoskeletal:       Right knee: She exhibits swelling and effusion. She exhibits normal range of motion, no ecchymosis, no deformity, no laceration, no erythema, normal alignment, no LCL laxity, normal patellar mobility, no bony tenderness and no MCL laxity. tenderness found. Medial joint line tenderness noted. No lateral joint line, no MCL, no LCL and no patellar tendon tenderness noted.       Left knee: Normal. She exhibits normal range of  motion, no swelling, no effusion, no ecchymosis, no deformity, no laceration, no erythema, normal alignment, no LCL laxity, normal patellar mobility, no bony tenderness, normal meniscus and no MCL laxity. no tenderness found. No lateral joint line, no MCL, no LCL and no patellar tendon tenderness noted.       Right knee was prepped and draped in sterile fashion, local anesthesia was obtained with 2% lido with epi - icc was used, then using a 25 gauge 1.5 inch needle the joint space was injected with 1/2 cc of depomedrol (40mg ) and 1/2 cc of plain 2% lidocaine, she tolerated the injection well with no complications.      Lab Results  Component Value Date   WBC 7.6 12/19/2010   HGB 14.4 12/19/2010   HCT 41.7 12/19/2010   PLT 411.0* 12/19/2010   GLUCOSE 102* 12/19/2010   CHOL 171 12/19/2010   TRIG 147.0 12/19/2010   HDL 40.60 12/19/2010   LDLCALC 101* 12/19/2010   ALT 15 12/19/2010   AST 19 12/19/2010   NA 140 12/19/2010   K 4.0 12/19/2010   CL 107 12/19/2010   CREATININE 0.8 12/19/2010   BUN 7 12/19/2010   CO2 26 12/19/2010   TSH 4.17 01/18/2011      Assessment & Plan:

## 2012-03-03 ENCOUNTER — Ambulatory Visit (INDEPENDENT_AMBULATORY_CARE_PROVIDER_SITE_OTHER): Payer: BC Managed Care – PPO | Admitting: Internal Medicine

## 2012-03-03 ENCOUNTER — Encounter: Payer: Self-pay | Admitting: Internal Medicine

## 2012-03-03 VITALS — BP 142/78 | HR 84 | Temp 98.3°F | Resp 16 | Wt 173.5 lb

## 2012-03-03 DIAGNOSIS — I10 Essential (primary) hypertension: Secondary | ICD-10-CM

## 2012-03-03 DIAGNOSIS — M25561 Pain in right knee: Secondary | ICD-10-CM

## 2012-03-03 DIAGNOSIS — M25569 Pain in unspecified knee: Secondary | ICD-10-CM

## 2012-03-03 NOTE — Patient Instructions (Signed)
Degenerative Arthritis  You have osteoarthritis. This is the wear and tear arthritis that comes with aging. It is also called degenerative arthritis. This is common in people past middle age. It is caused by stress on the joints. The large weight bearing joints of the lower extremities are most often affected. The knees, hips, back, neck, and hands can become painful, swollen, and stiff. This is the most common type of arthritis. It comes on with age, carrying too much weight, or from an injury.  Treatment includes resting the sore joint until the pain and swelling improve. Crutches or a walker may be needed for severe flares. Only take over-the-counter or prescription medicines for pain, discomfort, or fever as directed by your caregiver. Local heat therapy may improve motion. Cortisone shots into the joint are sometimes used to reduce pain and swelling during flares.  Osteoarthritis is usually not crippling and progresses slowly. There are things you can do to decrease pain:  · Avoid high impact activities.  · Exercise regularly.  · Low impact exercises such as walking, biking and swimming help to keep the muscles strong and keep normal joint function.  · Stretching helps to keep your range of motion.  · Lose weight if you are overweight. This reduces joint stress.  In severe cases when you have pain at rest or increasing disability, joint surgery may be helpful. See your caregiver for follow-up treatment as recommended.   SEEK IMMEDIATE MEDICAL CARE IF:   · You have severe joint pain.  · Marked swelling and redness in your joint develops.  · You develop a high fever.  Document Released: 10/15/2005 Document Revised: 10/04/2011 Document Reviewed: 03/17/2007  Exit[employer]® Patient Information ©2012 [employer].

## 2012-03-04 ENCOUNTER — Encounter: Payer: Self-pay | Admitting: Internal Medicine

## 2012-03-05 NOTE — Assessment & Plan Note (Signed)
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

## 2012-03-05 NOTE — Progress Notes (Signed)
  Subjective:    Patient ID: Sarah Watts, female    DOB: Apr 24, 1966, 46 y.o.   MRN: [id number]  Knee Pain  The incident occurred more than 1 week ago. The incident occurred at work. There was no injury mechanism. The pain is present in the right knee. The quality of the pain is described as aching. The pain is at a severity of 1/10. The pain is mild. The pain has been intermittent since onset. Pertinent negatives include no inability to bear weight, loss of motion, loss of sensation, muscle weakness, numbness or tingling. The symptoms are aggravated by weight bearing. She has tried nothing for the symptoms.      Review of Systems  Constitutional: Negative.   HENT: Negative.   Eyes: Negative.   Respiratory: Negative.   Cardiovascular: Negative.   Gastrointestinal: Negative.   Genitourinary: Negative.   Musculoskeletal: Positive for arthralgias (right knee).  Skin: Negative.   Neurological: Negative.  Negative for tingling and numbness.  Hematological: Negative.   Psychiatric/Behavioral: Negative.        Objective:   Physical Exam  Musculoskeletal:       Right knee: She exhibits normal range of motion, no swelling, no effusion, no ecchymosis, no deformity, no laceration, no erythema, normal alignment, no LCL laxity, normal patellar mobility, no bony tenderness, normal meniscus and no MCL laxity. tenderness found. Medial joint line tenderness noted. No lateral joint line, no MCL, no LCL and no patellar tendon tenderness noted.      Dg Knee Complete 4 Views Right  01/30/2012  *RADIOLOGY REPORT*  Clinical Data: Pain and swelling  RIGHT KNEE - COMPLETE 4+ VIEW  Comparison: None.  Findings: There is a knee joint effusion.  No joint space narrowing.  No osteophytes.  No fracture or other focal lesion.  IMPRESSION: Joint effusion.  No acute or focal finding.  Original Report Authenticated By: Thomasenia Sales, M.D.     Assessment & Plan:

## 2012-03-05 NOTE — Assessment & Plan Note (Signed)
Her BP is well controlled 

## 2012-03-10 DIAGNOSIS — Z0279 Encounter for issue of other medical certificate: Secondary | ICD-10-CM

## 2012-03-28 ENCOUNTER — Encounter: Payer: Self-pay | Admitting: Internal Medicine

## 2012-06-23 ENCOUNTER — Other Ambulatory Visit: Payer: Self-pay | Admitting: Internal Medicine

## 2012-08-30 ENCOUNTER — Other Ambulatory Visit: Payer: Self-pay | Admitting: Emergency Medicine

## 2012-08-31 NOTE — Telephone Encounter (Signed)
Patients chart is at the nurses station in the pa pool pile.  [facility] [id number]

## 2012-10-15 ENCOUNTER — Other Ambulatory Visit: Payer: Self-pay | Admitting: Emergency Medicine

## 2012-10-15 NOTE — Telephone Encounter (Signed)
Please pull paper chart.  

## 2012-10-16 ENCOUNTER — Other Ambulatory Visit: Payer: Self-pay

## 2012-10-16 MED ORDER — FLUTICASONE PROPIONATE 50 MCG/ACT NA SUSP: 2.0000 | Freq: Every day | NASAL | Status: DC

## 2012-10-16 NOTE — Telephone Encounter (Signed)
DOS [id number] is at nurse's station in [place] provider pool.

## 2012-10-26 ENCOUNTER — Other Ambulatory Visit: Payer: Self-pay | Admitting: Radiology

## 2013-01-26 ENCOUNTER — Other Ambulatory Visit: Payer: Self-pay | Admitting: Internal Medicine

## 2013-01-29 ENCOUNTER — Other Ambulatory Visit: Payer: Self-pay | Admitting: Internal Medicine

## 2013-01-30 ENCOUNTER — Other Ambulatory Visit: Payer: Self-pay | Admitting: Internal Medicine

## 2013-01-30 ENCOUNTER — Telehealth: Payer: Self-pay | Admitting: Internal Medicine

## 2013-01-30 NOTE — Telephone Encounter (Signed)
Patient is completely out of Tribenzor.  She is leaving for [facility] tonight.  She would like something called in to [facility] on [address] in [place] today.  Please give her a call when it has been done.

## 2013-01-30 NOTE — Telephone Encounter (Signed)
Left message for patient to call back and schedule appointment.

## 2013-01-30 NOTE — Telephone Encounter (Signed)
Per 01/29/13 note in epic, MD declined refills stating pt needs appt.

## 2013-02-05 NOTE — Telephone Encounter (Signed)
Patient scheduled 02/13/13 with Dr. Yetta Barre

## 2013-02-13 ENCOUNTER — Other Ambulatory Visit (INDEPENDENT_AMBULATORY_CARE_PROVIDER_SITE_OTHER): Payer: BC Managed Care – PPO

## 2013-02-13 ENCOUNTER — Ambulatory Visit (INDEPENDENT_AMBULATORY_CARE_PROVIDER_SITE_OTHER)
Admission: RE | Admit: 2013-02-13 | Discharge: 2013-02-13 | Disposition: A | Discharge status: Home / Self Care | Payer: BC Managed Care – PPO | Source: Ambulatory Visit | Attending: Internal Medicine | Admitting: Internal Medicine

## 2013-02-13 ENCOUNTER — Ambulatory Visit (INDEPENDENT_AMBULATORY_CARE_PROVIDER_SITE_OTHER): Payer: BC Managed Care – PPO | Admitting: Internal Medicine

## 2013-02-13 ENCOUNTER — Encounter: Payer: Self-pay | Admitting: Internal Medicine

## 2013-02-13 VITALS — BP 132/90 | HR 76 | Temp 98.1°F | Resp 16 | Ht 60.0 in | Wt 167.1 lb

## 2013-02-13 DIAGNOSIS — Z0000 Encounter for general adult medical examination without abnormal findings: Secondary | ICD-10-CM

## 2013-02-13 DIAGNOSIS — M542 Cervicalgia: Secondary | ICD-10-CM

## 2013-02-13 DIAGNOSIS — M4802 Spinal stenosis, cervical region: Secondary | ICD-10-CM | POA: Insufficient documentation

## 2013-02-13 DIAGNOSIS — Z1231 Encounter for screening mammogram for malignant neoplasm of breast: Secondary | ICD-10-CM | POA: Insufficient documentation

## 2013-02-13 DIAGNOSIS — E039 Hypothyroidism, unspecified: Secondary | ICD-10-CM

## 2013-02-13 DIAGNOSIS — I10 Essential (primary) hypertension: Secondary | ICD-10-CM

## 2013-02-13 DIAGNOSIS — Z23 Encounter for immunization: Secondary | ICD-10-CM

## 2013-02-13 LAB — URINALYSIS, ROUTINE W REFLEX MICROSCOPIC
Ketones, ur: NEGATIVE
Specific Gravity, Urine: 1.01 (ref 1.000–1.030)
Total Protein, Urine: NEGATIVE
Urine Glucose: NEGATIVE
Urobilinogen, UA: 0.2 (ref 0.0–1.0)

## 2013-02-13 LAB — COMPREHENSIVE METABOLIC PANEL
ALT: 18 U/L (ref 0–35)
AST: 20 U/L (ref 0–37)
Albumin: 3.9 g/dL (ref 3.5–5.2)
Alkaline Phosphatase: 71 U/L (ref 39–117)
BUN: 12 mg/dL (ref 6–23)
Calcium: 9.1 mg/dL (ref 8.4–10.5)
Chloride: 101 mEq/L (ref 96–112)
Potassium: 3.6 mEq/L (ref 3.5–5.1)
Sodium: 137 mEq/L (ref 135–145)

## 2013-02-13 LAB — CBC WITH DIFFERENTIAL/PLATELET
Basophils Absolute: 0 10*3/uL (ref 0.0–0.1)
Eosinophils Absolute: 0.3 10*3/uL (ref 0.0–0.7)
Lymphocytes Relative: 15.8 % (ref 12.0–46.0)
MCHC: 34.3 g/dL (ref 30.0–36.0)
MCV: 85.2 fl (ref 78.0–100.0)
Monocytes Absolute: 0.5 10*3/uL (ref 0.1–1.0)
Neutrophils Relative %: 77.1 % — ABNORMAL HIGH (ref 43.0–77.0)
Platelets: 381 10*3/uL (ref 150.0–400.0)
RBC: 4.67 Mil/uL (ref 3.87–5.11)
RDW: 13.4 % (ref 11.5–14.6)

## 2013-02-13 LAB — LIPID PANEL
Cholesterol: 178 mg/dL (ref 0–200)
HDL: 36.6 mg/dL — ABNORMAL LOW (ref >39.00)
LDL Cholesterol: 104 mg/dL — ABNORMAL HIGH (ref 0–99)
Triglycerides: 189 mg/dL — ABNORMAL HIGH (ref 0.0–149.0)
VLDL: 37.8 mg/dL (ref 0.0–40.0)

## 2013-02-13 MED ORDER — OLMESARTAN-AMLODIPINE-HCTZ 40-5-12.5 MG PO TABS: 1.0000 | ORAL_TABLET | Freq: Every day | ORAL | Status: DC

## 2013-02-13 MED ORDER — TETANUS-DIPHTH-ACELL PERTUSSIS 5-2.5-18.5 LF-MCG/0.5 IM SUSP: 0.5000 mL | Freq: Once | INTRAMUSCULAR | Status: DC

## 2013-02-13 NOTE — Patient Instructions (Signed)
Preventive Care for Adults, Female A healthy lifestyle and preventive care can promote health and wellness. Preventive health guidelines for women include the following key practices.  A routine yearly physical is a good way to check with your caregiver about your health and preventive screening. It is a chance to share any concerns and updates on your health, and to receive a thorough exam.  Visit your dentist for a routine exam and preventive care every 6 months. Brush your teeth twice a day and floss once a day. Good oral hygiene prevents tooth decay and gum disease.  The frequency of eye exams is based on your age, health, family medical history, use of contact lenses, and other factors. Follow your caregiver's recommendations for frequency of eye exams.  Eat a healthy diet. Foods like vegetables, fruits, whole grains, low-fat dairy products, and lean protein foods contain the nutrients you need without too many calories. Decrease your intake of foods high in solid fats, added sugars, and salt. Eat the right amount of calories for you.Get information about a proper diet from your caregiver, if necessary.  Regular physical exercise is one of the most important things you can do for your health. Most adults should get at least 150 minutes of moderate-intensity exercise (any activity that increases your heart rate and causes you to sweat) each week. In addition, most adults need muscle-strengthening exercises on 2 or more days a week.  Maintain a healthy weight. The body mass index (BMI) is a screening tool to identify possible weight problems. It provides an estimate of body fat based on height and weight. Your caregiver can help determine your BMI, and can help you achieve or maintain a healthy weight.For adults 20 years and older:  A BMI below 18.5 is considered underweight.  A BMI of 18.5 to 24.9 is normal.  A BMI of 25 to 29.9 is considered overweight.  A BMI of 30 and above is  considered obese.  Maintain normal blood lipids and cholesterol levels by exercising and minimizing your intake of saturated fat. Eat a balanced diet with plenty of fruit and vegetables. Blood tests for lipids and cholesterol should begin at age 20 and be repeated every 5 years. If your lipid or cholesterol levels are high, you are over 50, or you are at high risk for heart disease, you may need your cholesterol levels checked more frequently.Ongoing high lipid and cholesterol levels should be treated with medicines if diet and exercise are not effective.  If you smoke, find out from your caregiver how to quit. If you do not use tobacco, do not start.  If you are pregnant, do not drink alcohol. If you are breastfeeding, be very cautious about drinking alcohol. If you are not pregnant and choose to drink alcohol, do not exceed 1 drink per day. One drink is considered to be 12 ounces (355 mL) of beer, 5 ounces (148 mL) of wine, or 1.5 ounces (44 mL) of liquor.  Avoid use of street drugs. Do not share needles with anyone. Ask for help if you need support or instructions about stopping the use of drugs.  High blood pressure causes heart disease and increases the risk of stroke. Your blood pressure should be checked at least every 1 to 2 years. Ongoing high blood pressure should be treated with medicines if weight loss and exercise are not effective.  If you are 55 to 48 years old, ask your caregiver if you should take aspirin to prevent strokes.  Diabetes   screening involves taking a blood sample to check your fasting blood sugar level. This should be done once every 3 years, after age 45, if you are within normal weight and without risk factors for diabetes. Testing should be considered at a younger age or be carried out more frequently if you are overweight and have at least 1 risk factor for diabetes.  Breast cancer screening is essential preventive care for women. You should practice "breast  self-awareness." This means understanding the normal appearance and feel of your breasts and may include breast self-examination. Any changes detected, no matter how small, should be reported to a caregiver. Women in their 20s and 30s should have a clinical breast exam (CBE) by a caregiver as part of a regular health exam every 1 to 3 years. After age 40, women should have a CBE every year. Starting at age 40, women should consider having a mammography (breast X-ray test) every year. Women who have a family history of breast cancer should talk to their caregiver about genetic screening. Women at a high risk of breast cancer should talk to their caregivers about having magnetic resonance imaging (MRI) and a mammography every year.  The Pap test is a screening test for cervical cancer. A Pap test can show cell changes on the cervix that might become cervical cancer if left untreated. A Pap test is a procedure in which cells are obtained and examined from the lower end of the uterus (cervix).  Women should have a Pap test starting at age 21.  Between ages 21 and 29, Pap tests should be repeated every 2 years.  Beginning at age 30, you should have a Pap test every 3 years as long as the past 3 Pap tests have been normal.  Some women have medical problems that increase the chance of getting cervical cancer. Talk to your caregiver about these problems. It is especially important to talk to your caregiver if a new problem develops soon after your last Pap test. In these cases, your caregiver may recommend more frequent screening and Pap tests.  The above recommendations are the same for women who have or have not gotten the vaccine for human papillomavirus (HPV).  If you had a hysterectomy for a problem that was not cancer or a condition that could lead to cancer, then you no longer need Pap tests. Even if you no longer need a Pap test, a regular exam is a good idea to make sure no other problems are  starting.  If you are between ages 65 and 70, and you have had normal Pap tests going back 10 years, you no longer need Pap tests. Even if you no longer need a Pap test, a regular exam is a good idea to make sure no other problems are starting.  If you have had past treatment for cervical cancer or a condition that could lead to cancer, you need Pap tests and screening for cancer for at least 20 years after your treatment.  If Pap tests have been discontinued, risk factors (such as a new sexual partner) need to be reassessed to determine if screening should be resumed.  The HPV test is an additional test that may be used for cervical cancer screening. The HPV test looks for the virus that can cause the cell changes on the cervix. The cells collected during the Pap test can be tested for HPV. The HPV test could be used to screen women aged 30 years and older, and should   be used in women of any age who have unclear Pap test results. After the age of 30, women should have HPV testing at the same frequency as a Pap test.  Colorectal cancer can be detected and often prevented. Most routine colorectal cancer screening begins at the age of 50 and continues through age 75. However, your caregiver may recommend screening at an earlier age if you have risk factors for colon cancer. On a yearly basis, your caregiver may provide home test kits to check for hidden blood in the stool. Use of a small camera at the end of a tube, to directly examine the colon (sigmoidoscopy or colonoscopy), can detect the earliest forms of colorectal cancer. Talk to your caregiver about this at age 50, when routine screening begins. Direct examination of the colon should be repeated every 5 to 10 years through age 75, unless early forms of pre-cancerous polyps or small growths are found.  Hepatitis C blood testing is recommended for all people born from 1945 through 1965 and any individual with known risks for hepatitis C.  Practice  safe sex. Use condoms and avoid high-risk sexual practices to reduce the spread of sexually transmitted infections (STIs). STIs include gonorrhea, chlamydia, syphilis, trichomonas, herpes, HPV, and human immunodeficiency virus (HIV). Herpes, HIV, and HPV are viral illnesses that have no cure. They can result in disability, cancer, and death. Sexually active women aged 25 and younger should be checked for chlamydia. Older women with new or multiple partners should also be tested for chlamydia. Testing for other STIs is recommended if you are sexually active and at increased risk.  Osteoporosis is a disease in which the bones lose minerals and strength with aging. This can result in serious bone fractures. The risk of osteoporosis can be identified using a bone density scan. Women ages 65 and over and women at risk for fractures or osteoporosis should discuss screening with their caregivers. Ask your caregiver whether you should take a calcium supplement or vitamin D to reduce the rate of osteoporosis.  Menopause can be associated with physical symptoms and risks. Hormone replacement therapy is available to decrease symptoms and risks. You should talk to your caregiver about whether hormone replacement therapy is right for you.  Use sunscreen with sun protection factor (SPF) of 30 or more. Apply sunscreen liberally and repeatedly throughout the day. You should seek shade when your shadow is shorter than you. Protect yourself by wearing long sleeves, pants, a wide-brimmed hat, and sunglasses year round, whenever you are outdoors.  Once a month, do a whole body skin exam, using a mirror to look at the skin on your back. Notify your caregiver of new moles, moles that have irregular borders, moles that are larger than a pencil eraser, or moles that have changed in shape or color.  Stay current with required immunizations.  Influenza. You need a dose every fall (or winter). The composition of the flu vaccine  changes each year, so being vaccinated once is not enough.  Pneumococcal polysaccharide. You need 1 to 2 doses if you smoke cigarettes or if you have certain chronic medical conditions. You need 1 dose at age 65 (or older) if you have never been vaccinated.  Tetanus, diphtheria, pertussis (Tdap, Td). Get 1 dose of Tdap vaccine if you are younger than age 65, are over 65 and have contact with an infant, are a healthcare worker, are pregnant, or simply want to be protected from whooping cough. After that, you need a Td   booster dose every 10 years. Consult your caregiver if you have not had at least 3 tetanus and diphtheria-containing shots sometime in your life or have a deep or dirty wound.  HPV. You need this vaccine if you are a woman age 26 or younger. The vaccine is given in 3 doses over 6 months.  Measles, mumps, rubella (MMR). You need at least 1 dose of MMR if you were born in 1957 or later. You may also need a second dose.  Meningococcal. If you are age 19 to 21 and a first-year college student living in a residence hall, or have one of several medical conditions, you need to get vaccinated against meningococcal disease. You may also need additional booster doses.  Zoster (shingles). If you are age 60 or older, you should get this vaccine.  Varicella (chickenpox). If you have never had chickenpox or you were vaccinated but received only 1 dose, talk to your caregiver to find out if you need this vaccine.  Hepatitis A. You need this vaccine if you have a specific risk factor for hepatitis A virus infection or you simply wish to be protected from this disease. The vaccine is usually given as 2 doses, 6 to 18 months apart.  Hepatitis B. You need this vaccine if you have a specific risk factor for hepatitis B virus infection or you simply wish to be protected from this disease. The vaccine is given in 3 doses, usually over 6 months. Preventive Services / Frequency Ages 19 to 39  Blood  pressure check.** / Every 1 to 2 years.  Lipid and cholesterol check.** / Every 5 years beginning at age 20.  Clinical breast exam.** / Every 3 years for women in their 20s and 30s.  Pap test.** / Every 2 years from ages 21 through 29. Every 3 years starting at age 30 through age 65 or 70 with a history of 3 consecutive normal Pap tests.  HPV screening.** / Every 3 years from ages 30 through ages 65 to 70 with a history of 3 consecutive normal Pap tests.  Hepatitis C blood test.** / For any individual with known risks for hepatitis C.  Skin self-exam. / Monthly.  Influenza immunization.** / Every year.  Pneumococcal polysaccharide immunization.** / 1 to 2 doses if you smoke cigarettes or if you have certain chronic medical conditions.  Tetanus, diphtheria, pertussis (Tdap, Td) immunization. / A one-time dose of Tdap vaccine. After that, you need a Td booster dose every 10 years.  HPV immunization. / 3 doses over 6 months, if you are 26 and younger.  Measles, mumps, rubella (MMR) immunization. / You need at least 1 dose of MMR if you were born in 1957 or later. You may also need a second dose.  Meningococcal immunization. / 1 dose if you are age 19 to 21 and a first-year college student living in a residence hall, or have one of several medical conditions, you need to get vaccinated against meningococcal disease. You may also need additional booster doses.  Varicella immunization.** / Consult your caregiver.  Hepatitis A immunization.** / Consult your caregiver. 2 doses, 6 to 18 months apart.  Hepatitis B immunization.** / Consult your caregiver. 3 doses usually over 6 months. Ages 40 to 64  Blood pressure check.** / Every 1 to 2 years.  Lipid and cholesterol check.** / Every 5 years beginning at age 20.  Clinical breast exam.** / Every year after age 40.  Mammogram.** / Every year beginning at age 40   and continuing for as long as you are in good health. Consult with your  caregiver.  Pap test.** / Every 3 years starting at age 30 through age 65 or 70 with a history of 3 consecutive normal Pap tests.  HPV screening.** / Every 3 years from ages 30 through ages 65 to 70 with a history of 3 consecutive normal Pap tests.  Fecal occult blood test (FOBT) of stool. / Every year beginning at age 50 and continuing until age 75. You may not need to do this test if you get a colonoscopy every 10 years.  Flexible sigmoidoscopy or colonoscopy.** / Every 5 years for a flexible sigmoidoscopy or every 10 years for a colonoscopy beginning at age 50 and continuing until age 75.  Hepatitis C blood test.** / For all people born from 1945 through 1965 and any individual with known risks for hepatitis C.  Skin self-exam. / Monthly.  Influenza immunization.** / Every year.  Pneumococcal polysaccharide immunization.** / 1 to 2 doses if you smoke cigarettes or if you have certain chronic medical conditions.  Tetanus, diphtheria, pertussis (Tdap, Td) immunization.** / A one-time dose of Tdap vaccine. After that, you need a Td booster dose every 10 years.  Measles, mumps, rubella (MMR) immunization. / You need at least 1 dose of MMR if you were born in 1957 or later. You may also need a second dose.  Varicella immunization.** / Consult your caregiver.  Meningococcal immunization.** / Consult your caregiver.  Hepatitis A immunization.** / Consult your caregiver. 2 doses, 6 to 18 months apart.  Hepatitis B immunization.** / Consult your caregiver. 3 doses, usually over 6 months. Ages 65 and over  Blood pressure check.** / Every 1 to 2 years.  Lipid and cholesterol check.** / Every 5 years beginning at age 20.  Clinical breast exam.** / Every year after age 40.  Mammogram.** / Every year beginning at age 40 and continuing for as long as you are in good health. Consult with your caregiver.  Pap test.** / Every 3 years starting at age 30 through age 65 or 70 with a 3  consecutive normal Pap tests. Testing can be stopped between 65 and 70 with 3 consecutive normal Pap tests and no abnormal Pap or HPV tests in the past 10 years.  HPV screening.** / Every 3 years from ages 30 through ages 65 or 70 with a history of 3 consecutive normal Pap tests. Testing can be stopped between 65 and 70 with 3 consecutive normal Pap tests and no abnormal Pap or HPV tests in the past 10 years.  Fecal occult blood test (FOBT) of stool. / Every year beginning at age 50 and continuing until age 75. You may not need to do this test if you get a colonoscopy every 10 years.  Flexible sigmoidoscopy or colonoscopy.** / Every 5 years for a flexible sigmoidoscopy or every 10 years for a colonoscopy beginning at age 50 and continuing until age 75.  Hepatitis C blood test.** / For all people born from 1945 through 1965 and any individual with known risks for hepatitis C.  Osteoporosis screening.** / A one-time screening for women ages 65 and over and women at risk for fractures or osteoporosis.  Skin self-exam. / Monthly.  Influenza immunization.** / Every year.  Pneumococcal polysaccharide immunization.** / 1 dose at age 65 (or older) if you have never been vaccinated.  Tetanus, diphtheria, pertussis (Tdap, Td) immunization. / A one-time dose of Tdap vaccine if you are over   65 and have contact with an infant, are a healthcare worker, or simply want to be protected from whooping cough. After that, you need a Td booster dose every 10 years.  Varicella immunization.** / Consult your caregiver.  Meningococcal immunization.** / Consult your caregiver.  Hepatitis A immunization.** / Consult your caregiver. 2 doses, 6 to 18 months apart.  Hepatitis B immunization.** / Check with your caregiver. 3 doses, usually over 6 months. ** Family history and personal history of risk and conditions may change your caregiver's recommendations. Document Released: 12/11/2001 Document Revised: 01/07/2012  Document Reviewed: 03/12/2011 [employer] Patient Information 2013 [employer].  

## 2013-02-13 NOTE — Progress Notes (Signed)
Subjective:    Patient ID: Sarah Watts, female    DOB: 19-Sep-1966, 47 y.o.   MRN: [id number]  Neck Pain  This is a recurrent problem. The current episode started more than 1 month ago. The problem occurs intermittently. The problem has been unchanged. The pain is associated with nothing. The pain is present in the left side. The quality of the pain is described as aching. The pain is at a severity of 2/10. The pain is mild. The symptoms are aggravated by position. The pain is same all the time. Pertinent negatives include no chest pain, fever, headaches, leg pain, numbness, pain with swallowing, paresis, photophobia, syncope, tingling, trouble swallowing, visual change, weakness or weight loss. She has tried NSAIDs for the symptoms. The treatment provided moderate relief.      Review of Systems  Constitutional: Negative.  Negative for fever, chills, weight loss, diaphoresis, activity change, appetite change, fatigue and unexpected weight change.  HENT: Positive for neck pain. Negative for trouble swallowing.   Eyes: Negative.  Negative for photophobia.  Respiratory: Negative.  Negative for apnea, cough, choking, chest tightness, shortness of breath, wheezing and stridor.   Cardiovascular: Negative.  Negative for chest pain, leg swelling and syncope.  Gastrointestinal: Negative.  Negative for nausea, vomiting, abdominal pain, diarrhea and constipation.  Genitourinary: Negative.   Musculoskeletal: Negative for myalgias, back pain, joint swelling and gait problem.  Skin: Negative for color change, pallor, rash and wound.  Allergic/Immunologic: Negative.   Neurological: Negative.  Negative for dizziness, tingling, weakness, light-headedness, numbness and headaches.  Hematological: Negative.  Negative for adenopathy. Does not bruise/bleed easily.  Psychiatric/Behavioral: Negative.        Objective:   Physical Exam  Vitals reviewed. Constitutional: She is oriented to person, place, and  time. She appears well-developed and well-nourished. No distress.  HENT:  Head: Normocephalic and atraumatic.  Mouth/Throat: Oropharynx is clear and moist. No oropharyngeal exudate.  Eyes: Conjunctivae are normal. Right eye exhibits no discharge. Left eye exhibits no discharge. No scleral icterus.  Neck: Trachea normal, normal range of motion and full passive range of motion without pain. Neck supple. No JVD present. No spinous process tenderness and no muscular tenderness present. No rigidity. No tracheal deviation, no edema, no erythema and normal range of motion present. No mass and no thyromegaly present.  Cardiovascular: Normal rate, regular rhythm, normal heart sounds and intact distal pulses.  Exam reveals no gallop and no friction rub.   No murmur heard. Pulmonary/Chest: Effort normal and breath sounds normal. No stridor. No respiratory distress. She has no wheezes. She has no rales. Chest wall is not dull to percussion. She exhibits no mass, no tenderness, no bony tenderness, no laceration, no crepitus, no edema, no deformity, no swelling and no retraction. Right breast exhibits no inverted nipple, no mass, no nipple discharge, no skin change and no tenderness. Left breast exhibits no inverted nipple, no mass, no nipple discharge, no skin change and no tenderness. Breasts are symmetrical.  Abdominal: Soft. Bowel sounds are normal. She exhibits no distension and no mass. There is no tenderness. There is no rebound and no guarding.  Genitourinary:  Deferred due to currently menstruating  Musculoskeletal: Normal range of motion. She exhibits no edema and no tenderness.       Cervical back: Normal. She exhibits normal range of motion, no tenderness, no bony tenderness, no swelling, no edema, no deformity, no laceration, no pain, no spasm and normal pulse.  Lymphadenopathy:    She has no  cervical adenopathy.  Neurological: She is alert and oriented to person, place, and time. She has normal  reflexes. She displays normal reflexes. No cranial nerve deficit. She exhibits normal muscle tone. Coordination normal.  Skin: Skin is warm and dry. No rash noted. She is not diaphoretic. No erythema. No pallor.  Psychiatric: She has a normal mood and affect. Her behavior is normal. Judgment and thought content normal.     Lab Results  Component Value Date   WBC 11.4* 02/13/2013   HGB 13.6 02/13/2013   HCT 39.8 02/13/2013   PLT 381.0 02/13/2013   GLUCOSE 113* 02/13/2013   CHOL 178 02/13/2013   TRIG 189.0* 02/13/2013   HDL 36.60* 02/13/2013   LDLCALC 104* 02/13/2013   ALT 18 02/13/2013   AST 20 02/13/2013   NA 137 02/13/2013   K 3.6 02/13/2013   CL 101 02/13/2013   CREATININE 0.8 02/13/2013   BUN 12 02/13/2013   CO2 28 02/13/2013   TSH 3.87 02/13/2013       Assessment & Plan:

## 2013-02-15 NOTE — Assessment & Plan Note (Signed)
Her plain film is abnormal so I have ordered an MRI

## 2013-02-15 NOTE — Assessment & Plan Note (Signed)
MRI has been ordered

## 2013-02-15 NOTE — Assessment & Plan Note (Signed)
Her BP is up a little as she has been out of tribenzor for about one week She will restart I will check her lytes and renal function today

## 2013-02-15 NOTE — Assessment & Plan Note (Signed)
I will recheck her TSH today 

## 2013-02-15 NOTE — Assessment & Plan Note (Signed)
She will RTC soon for a PAP Other exams were done today Vaccines were reviewed and updated Labs ordered She was referred for a mammogram

## 2013-02-18 ENCOUNTER — Ambulatory Visit (HOSPITAL_COMMUNITY)
Admission: RE | Admit: 2013-02-18 | Discharge: 2013-02-18 | Disposition: A | Discharge status: Home / Self Care | Payer: BC Managed Care – PPO | Source: Ambulatory Visit | Attending: Internal Medicine | Admitting: Internal Medicine

## 2013-02-18 DIAGNOSIS — Z1231 Encounter for screening mammogram for malignant neoplasm of breast: Secondary | ICD-10-CM | POA: Insufficient documentation

## 2013-02-19 ENCOUNTER — Inpatient Hospital Stay: Admission: RE | Admit: 2013-02-19 | Payer: BC Managed Care – PPO | Source: Ambulatory Visit

## 2013-02-19 LAB — HM MAMMOGRAPHY: HM Mammogram: NORMAL

## 2013-03-03 ENCOUNTER — Encounter: Payer: Self-pay | Admitting: Internal Medicine

## 2013-03-03 ENCOUNTER — Ambulatory Visit (INDEPENDENT_AMBULATORY_CARE_PROVIDER_SITE_OTHER): Payer: BC Managed Care – PPO | Admitting: Internal Medicine

## 2013-03-03 ENCOUNTER — Other Ambulatory Visit (INDEPENDENT_AMBULATORY_CARE_PROVIDER_SITE_OTHER): Payer: BC Managed Care – PPO

## 2013-03-03 VITALS — BP 130/88 | HR 78 | Temp 97.8°F | Resp 16 | Ht 60.0 in | Wt 166.8 lb

## 2013-03-03 DIAGNOSIS — R7309 Other abnormal glucose: Secondary | ICD-10-CM

## 2013-03-03 DIAGNOSIS — R7303 Prediabetes: Secondary | ICD-10-CM | POA: Insufficient documentation

## 2013-03-03 DIAGNOSIS — I10 Essential (primary) hypertension: Secondary | ICD-10-CM

## 2013-03-03 DIAGNOSIS — M4802 Spinal stenosis, cervical region: Secondary | ICD-10-CM

## 2013-03-03 LAB — BASIC METABOLIC PANEL
CO2: 27 mEq/L (ref 19–32)
Calcium: 9 mg/dL (ref 8.4–10.5)
Creatinine, Ser: 0.7 mg/dL (ref 0.4–1.2)
GFR: 91.88 mL/min (ref >60.00)
Sodium: 136 mEq/L (ref 135–145)

## 2013-03-03 LAB — HEMOGLOBIN A1C: Hgb A1c MFr Bld: 6 % (ref 4.6–6.5)

## 2013-03-03 MED ORDER — OLMESARTAN-AMLODIPINE-HCTZ 40-5-12.5 MG PO TABS: 1.0000 | ORAL_TABLET | Freq: Every day | ORAL | Status: DC

## 2013-03-03 NOTE — Assessment & Plan Note (Signed)
She can't afford to get the MRI done

## 2013-03-03 NOTE — Progress Notes (Signed)
  Subjective:    Patient ID: Sarah Watts, female    DOB: 1966-05-30, 47 y.o.   MRN: [id number]  HPI  She returns for f/up on recent labs that showed mild fasting hyperglycemia.  Review of Systems  Constitutional: Negative.  Negative for fever, chills, diaphoresis, activity change, appetite change, fatigue and unexpected weight change.  HENT: Negative.   Eyes: Negative.   Respiratory: Negative.  Negative for apnea, cough, choking, shortness of breath, wheezing and stridor.   Cardiovascular: Negative.  Negative for chest pain, palpitations and leg swelling.  Gastrointestinal: Negative.  Negative for nausea, vomiting, diarrhea, constipation and anal bleeding.  Endocrine: Positive for polydipsia and polyuria. Negative for cold intolerance, heat intolerance and polyphagia.  Musculoskeletal: Negative.  Negative for myalgias, back pain, joint swelling and gait problem.  Skin: Negative.   Allergic/Immunologic: Negative.   Neurological: Negative.  Negative for dizziness, tremors, weakness and light-headedness.  Hematological: Negative.  Negative for adenopathy. Does not bruise/bleed easily.  Psychiatric/Behavioral: Negative.        Objective:   Physical Exam  Vitals reviewed. Constitutional: She is oriented to person, place, and time. She appears well-developed and well-nourished. No distress.  HENT:  Head: Normocephalic and atraumatic.  Mouth/Throat: Oropharynx is clear and moist. No oropharyngeal exudate.  Eyes: Conjunctivae are normal. Right eye exhibits no discharge. Left eye exhibits no discharge. No scleral icterus.  Neck: Normal range of motion. Neck supple. No JVD present. No tracheal deviation present. No thyromegaly present.  Cardiovascular: Normal rate, regular rhythm, normal heart sounds and intact distal pulses.  Exam reveals no gallop and no friction rub.   No murmur heard. Pulmonary/Chest: Effort normal and breath sounds normal. No stridor. No respiratory distress. She  has no wheezes. She has no rales. She exhibits no tenderness.  Abdominal: Soft. Bowel sounds are normal. She exhibits no distension and no mass. There is no tenderness. There is no rebound and no guarding.  Musculoskeletal: Normal range of motion. She exhibits no edema and no tenderness.  Lymphadenopathy:    She has no cervical adenopathy.  Neurological: She is oriented to person, place, and time.  Skin: Skin is warm and dry. No rash noted. She is not diaphoretic. No erythema. No pallor.  Psychiatric: She has a normal mood and affect. Her behavior is normal. Judgment and thought content normal.     Lab Results  Component Value Date   WBC 11.4* 02/13/2013   HGB 13.6 02/13/2013   HCT 39.8 02/13/2013   PLT 381.0 02/13/2013   GLUCOSE 113* 02/13/2013   CHOL 178 02/13/2013   TRIG 189.0* 02/13/2013   HDL 36.60* 02/13/2013   LDLCALC 104* 02/13/2013   ALT 18 02/13/2013   AST 20 02/13/2013   NA 137 02/13/2013   K 3.6 02/13/2013   CL 101 02/13/2013   CREATININE 0.8 02/13/2013   BUN 12 02/13/2013   CO2 28 02/13/2013   TSH 3.87 02/13/2013        Assessment & Plan:

## 2013-03-03 NOTE — Assessment & Plan Note (Signed)
Her BP is well controlled 

## 2013-03-03 NOTE — Assessment & Plan Note (Signed)
I will check her A1C to see if she has developed DM2 

## 2013-05-12 ENCOUNTER — Ambulatory Visit (INDEPENDENT_AMBULATORY_CARE_PROVIDER_SITE_OTHER): Payer: BC Managed Care – PPO | Admitting: Internal Medicine

## 2013-05-12 ENCOUNTER — Other Ambulatory Visit: Payer: BC Managed Care – PPO

## 2013-05-12 ENCOUNTER — Other Ambulatory Visit (HOSPITAL_COMMUNITY)
Admission: RE | Admit: 2013-05-12 | Discharge: 2013-05-12 | Disposition: A | Discharge status: Home / Self Care | Payer: BC Managed Care – PPO | Source: Ambulatory Visit | Attending: Internal Medicine | Admitting: Internal Medicine

## 2013-05-12 ENCOUNTER — Encounter: Payer: Self-pay | Admitting: Internal Medicine

## 2013-05-12 VITALS — BP 138/86 | HR 77 | Temp 97.6°F | Resp 16 | Wt 166.0 lb

## 2013-05-12 DIAGNOSIS — Z113 Encounter for screening for infections with a predominantly sexual mode of transmission: Secondary | ICD-10-CM | POA: Insufficient documentation

## 2013-05-12 DIAGNOSIS — N898 Other specified noninflammatory disorders of vagina: Secondary | ICD-10-CM

## 2013-05-12 DIAGNOSIS — Z01419 Encounter for gynecological examination (general) (routine) without abnormal findings: Secondary | ICD-10-CM | POA: Insufficient documentation

## 2013-05-12 DIAGNOSIS — I10 Essential (primary) hypertension: Secondary | ICD-10-CM

## 2013-05-12 LAB — WET PREP, GENITAL

## 2013-05-12 MED ORDER — METRONIDAZOLE 250 MG PO TABS: 250.0000 mg | ORAL_TABLET | Freq: Three times a day (TID) | ORAL | Status: DC

## 2013-05-12 NOTE — Assessment & Plan Note (Signed)
Her BP is well controlled 

## 2013-05-12 NOTE — Progress Notes (Signed)
Subjective:    Patient ID: Sarah Watts, female    DOB: 23-Sep-1966, 47 y.o.   MRN: [id number]  Vaginal Discharge The patient's primary symptoms include a genital odor and a vaginal discharge. The patient's pertinent negatives include no genital itching, genital lesions, genital rash, missed menses, pelvic pain or vaginal bleeding. This is a recurrent (for 2 months) problem. The current episode started more than 1 month ago. The problem has been unchanged. The patient is experiencing no pain. The problem affects both sides. She is not pregnant. Pertinent negatives include no abdominal pain, anorexia, back pain, chills, constipation, diarrhea, discolored urine, dysuria, fever, flank pain, frequency, headaches, hematuria, joint pain, joint swelling, nausea, painful intercourse, rash, sore throat, urgency or vomiting. The vaginal discharge was malodorous, scant, thin and white. There has been no bleeding. She has not been passing clots. She has not been passing tissue. Nothing aggravates the symptoms. She has tried nothing for the symptoms. The treatment provided no relief. She is sexually active. No, her partner does not have an STD. She uses nothing for contraception. Her menstrual history has been regular.      Review of Systems  Constitutional: Negative.  Negative for fever, chills, diaphoresis, activity change, appetite change, fatigue and unexpected weight change.  HENT: Negative for sore throat and facial swelling.   Eyes: Negative.   Respiratory: Negative.  Negative for cough, chest tightness, shortness of breath, wheezing and stridor.   Cardiovascular: Negative.  Negative for chest pain, palpitations and leg swelling.  Gastrointestinal: Negative.  Negative for nausea, vomiting, abdominal pain, diarrhea, constipation and anorexia.  Endocrine: Negative.   Genitourinary: Positive for vaginal discharge. Negative for dysuria, urgency, frequency, hematuria, flank pain, decreased urine volume,  vaginal bleeding, enuresis, difficulty urinating, genital sores, vaginal pain, menstrual problem, pelvic pain, dyspareunia and missed menses.  Musculoskeletal: Negative.  Negative for back pain and joint pain.  Skin: Negative.  Negative for rash.  Allergic/Immunologic: Negative.   Neurological: Negative.  Negative for dizziness and headaches.  Hematological: Negative.   Psychiatric/Behavioral: Negative.        Objective:   Physical Exam  Vitals reviewed. Constitutional: She is oriented to person, place, and time. She appears well-developed and well-nourished. No distress.  HENT:  Head: Normocephalic and atraumatic.  Mouth/Throat: Oropharynx is clear and moist. No oropharyngeal exudate.  Eyes: Conjunctivae are normal. Right eye exhibits no discharge. Left eye exhibits no discharge. No scleral icterus.  Neck: Normal range of motion. Neck supple. No JVD present. No tracheal deviation present. No thyromegaly present.  Cardiovascular: Normal rate, regular rhythm, normal heart sounds and intact distal pulses.  Exam reveals no gallop and no friction rub.   No murmur heard. Pulmonary/Chest: Effort normal and breath sounds normal. No stridor. No respiratory distress. She has no wheezes. She has no rales. She exhibits no tenderness.  Abdominal: Soft. Bowel sounds are normal. She exhibits no distension and no mass. There is no tenderness. There is no rebound and no guarding. Hernia confirmed negative in the right inguinal area and confirmed negative in the left inguinal area.  Genitourinary: No breast swelling, tenderness, discharge or bleeding. Pelvic exam was performed with patient supine. No labial fusion. There is no rash, tenderness, lesion or injury on the right labia. There is no rash, tenderness, lesion or injury on the left labia. No erythema, tenderness or bleeding around the vagina. No foreign body around the vagina. No signs of injury around the vagina. No vaginal discharge found.  There is  a faint  foul odor but no bleeding or discharge  Musculoskeletal: Normal range of motion. She exhibits no edema and no tenderness.  Lymphadenopathy:    She has no cervical adenopathy.       Right: No inguinal adenopathy present.       Left: No inguinal adenopathy present.  Neurological: She is oriented to person, place, and time.  Skin: Skin is warm and dry. No rash noted. She is not diaphoretic. No erythema. No pallor.  Psychiatric: She has a normal mood and affect. Her behavior is normal. Judgment and thought content normal.     Lab Results  Component Value Date   WBC 11.4* 02/13/2013   HGB 13.6 02/13/2013   HCT 39.8 02/13/2013   PLT 381.0 02/13/2013   GLUCOSE 111* 03/03/2013   CHOL 178 02/13/2013   TRIG 189.0* 02/13/2013   HDL 36.60* 02/13/2013   LDLCALC 104* 02/13/2013   ALT 18 02/13/2013   AST 20 02/13/2013   NA 136 03/03/2013   K 3.5 03/03/2013   CL 102 03/03/2013   CREATININE 0.7 03/03/2013   BUN 10 03/03/2013   CO2 27 03/03/2013   TSH 3.87 02/13/2013   HGBA1C 6.0 03/03/2013       Assessment & Plan:

## 2013-05-12 NOTE — Assessment & Plan Note (Signed)
I think she has BV so I have prescribed flagyl Will also check her PAP, wet prep, GC/chlamydia

## 2013-05-12 NOTE — Patient Instructions (Signed)
Bacterial Vaginosis Bacterial vaginosis (BV) is a vaginal infection where the normal balance of bacteria in the vagina is disrupted. The normal balance is then replaced by an overgrowth of certain bacteria. There are several different kinds of bacteria that can cause BV. BV is the most common vaginal infection in women of childbearing age. CAUSES   The cause of BV is not fully understood. BV develops when there is an increase or imbalance of harmful bacteria.  Some activities or behaviors can upset the normal balance of bacteria in the vagina and put women at increased risk including:  Having a new sex partner or multiple sex partners.  Douching.  Using an intrauterine device (IUD) for contraception.  It is not clear what role sexual activity plays in the development of BV. However, women that have never had sexual intercourse are rarely infected with BV. Women do not get BV from toilet seats, bedding, swimming pools or from touching objects around them.  SYMPTOMS   Grey vaginal discharge.  A fish-like odor with discharge, especially after sexual intercourse.  Itching or burning of the vagina and vulva.  Burning or pain with urination.  Some women have no signs or symptoms at all. DIAGNOSIS  Your caregiver must examine the vagina for signs of BV. Your caregiver will perform lab tests and look at the sample of vaginal fluid through a microscope. They will look for bacteria and abnormal cells (clue cells), a pH test higher than 4.5, and a positive amine test all associated with BV.  RISKS AND COMPLICATIONS   Pelvic inflammatory disease (PID).  Infections following gynecology surgery.  Developing HIV.  Developing herpes virus. TREATMENT  Sometimes BV will clear up without treatment. However, all women with symptoms of BV should be treated to avoid complications, especially if gynecology surgery is planned. Female partners generally do not need to be treated. However, BV may spread  between female sex partners so treatment is helpful in preventing a recurrence of BV.   BV may be treated with antibiotics. The antibiotics come in either pill or vaginal cream forms. Either can be used with nonpregnant or pregnant women, but the recommended dosages differ. These antibiotics are not harmful to the baby.  BV can recur after treatment. If this happens, a second round of antibiotics will often be prescribed.  Treatment is important for pregnant women. If not treated, BV can cause a premature delivery, especially for a pregnant woman who had a premature birth in the past. All pregnant women who have symptoms of BV should be checked and treated.  For chronic reoccurrence of BV, treatment with a type of prescribed gel vaginally twice a week is helpful. HOME CARE INSTRUCTIONS   Finish all medication as directed by your caregiver.  Do not have sex until treatment is completed.  Tell your sexual partner that you have a vaginal infection. They should see their caregiver and be treated if they have problems, such as a mild rash or itching.  Practice safe sex. Use condoms. Only have 1 sex partner. PREVENTION  Basic prevention steps can help reduce the risk of upsetting the natural balance of bacteria in the vagina and developing BV:  Do not have sexual intercourse (be abstinent).  Do not douche.  Use all of the medicine prescribed for treatment of BV, even if the signs and symptoms go away.  Tell your sex partner if you have BV. That way, they can be treated, if needed, to prevent reoccurrence. SEEK MEDICAL CARE IF:     Your symptoms are not improving after 3 days of treatment.  You have increased discharge, pain, or fever. MAKE SURE YOU:   Understand these instructions.  Will watch your condition.  Will get help right away if you are not doing well or get worse. FOR MORE INFORMATION  Division of STD Prevention (DSTDP), Centers for Disease Control and Prevention:  www.cdc.gov/std American [employer] Association (ASHA): www.ashastd.org  Document Released: 10/15/2005 Document Revised: 01/07/2012 Document Reviewed: 04/07/2009 [employer] Patient Information 2014 [employer].  

## 2013-05-15 ENCOUNTER — Encounter: Payer: Self-pay | Admitting: Internal Medicine

## 2013-05-15 LAB — HM PAP SMEAR: HM Pap smear: NORMAL

## 2013-06-17 ENCOUNTER — Encounter: Payer: Self-pay | Admitting: Internal Medicine

## 2013-06-17 ENCOUNTER — Ambulatory Visit (INDEPENDENT_AMBULATORY_CARE_PROVIDER_SITE_OTHER)
Admission: RE | Admit: 2013-06-17 | Discharge: 2013-06-17 | Disposition: A | Discharge status: Home / Self Care | Payer: BC Managed Care – PPO | Source: Ambulatory Visit | Attending: Internal Medicine | Admitting: Internal Medicine

## 2013-06-17 ENCOUNTER — Ambulatory Visit (INDEPENDENT_AMBULATORY_CARE_PROVIDER_SITE_OTHER): Payer: BC Managed Care – PPO | Admitting: Internal Medicine

## 2013-06-17 VITALS — BP 142/88 | HR 76 | Temp 97.0°F | Resp 16 | Ht 60.0 in | Wt 169.4 lb

## 2013-06-17 DIAGNOSIS — Z23 Encounter for immunization: Secondary | ICD-10-CM

## 2013-06-17 DIAGNOSIS — J209 Acute bronchitis, unspecified: Secondary | ICD-10-CM

## 2013-06-17 DIAGNOSIS — R051 Acute cough: Secondary | ICD-10-CM | POA: Insufficient documentation

## 2013-06-17 DIAGNOSIS — R05 Cough: Secondary | ICD-10-CM

## 2013-06-17 MED ORDER — AZITHROMYCIN 500 MG PO TABS: 500.0000 mg | ORAL_TABLET | Freq: Every day | ORAL | Status: DC

## 2013-06-17 MED ORDER — PROMETHAZINE-DM 6.25-15 MG/5ML PO SYRP: 5.0000 mL | ORAL_SOLUTION | Freq: Four times a day (QID) | ORAL | Status: DC | PRN

## 2013-06-17 NOTE — Progress Notes (Signed)
  Subjective:    Patient ID: Sarah Watts, female    DOB: 07-07-1966, 47 y.o.   MRN: [id number]  Cough This is a recurrent problem. The current episode started in the past 7 days. The problem has been gradually worsening. The problem occurs every few hours. The cough is productive of sputum. Associated symptoms include chills, a fever, shortness of breath and wheezing. Pertinent negatives include no chest pain, ear congestion, ear pain, headaches, heartburn, hemoptysis, myalgias, nasal congestion, postnasal drip, rash, rhinorrhea, sore throat, sweats or weight loss. Nothing aggravates the symptoms. Risk factors for lung disease include smoking/tobacco exposure. She has tried nothing for the symptoms. The treatment provided no relief. Her past medical history is significant for COPD.      Review of Systems  Constitutional: Positive for fever and chills. Negative for weight loss, diaphoresis, activity change, appetite change, fatigue and unexpected weight change.  HENT: Negative.  Negative for ear pain, sore throat, rhinorrhea and postnasal drip.   Eyes: Negative.   Respiratory: Positive for cough, shortness of breath and wheezing. Negative for apnea, hemoptysis, choking, chest tightness and stridor.   Cardiovascular: Negative.  Negative for chest pain, palpitations and leg swelling.  Gastrointestinal: Negative.  Negative for heartburn.  Endocrine: Negative.   Genitourinary: Negative.   Musculoskeletal: Negative.  Negative for myalgias.  Skin: Negative.  Negative for rash.  Allergic/Immunologic: Negative.   Neurological: Negative.  Negative for dizziness and headaches.  Hematological: Negative.  Negative for adenopathy. Does not bruise/bleed easily.  Psychiatric/Behavioral: Negative.        Objective:   Physical Exam  Vitals reviewed. Constitutional: She appears well-developed and well-nourished.  Non-toxic appearance. She does not have a sickly appearance. She does not appear ill. No  distress.  HENT:  Head: Normocephalic and atraumatic.  Mouth/Throat: Oropharynx is clear and moist. No oropharyngeal exudate.  Eyes: Conjunctivae are normal. Right eye exhibits no discharge. Left eye exhibits no discharge. No scleral icterus.  Neck: Normal range of motion. Neck supple. No JVD present. No tracheal deviation present. No thyromegaly present.  Cardiovascular: Normal rate, regular rhythm, normal heart sounds and intact distal pulses.  Exam reveals no gallop and no friction rub.   No murmur heard. Pulmonary/Chest: Effort normal and breath sounds normal. No accessory muscle usage or stridor. Not tachypneic. No respiratory distress. She has no decreased breath sounds. She has no wheezes. She has no rhonchi. She has no rales. She exhibits no tenderness.  Abdominal: Soft. Bowel sounds are normal. She exhibits no distension and no mass. There is no tenderness. There is no rebound and no guarding.  Musculoskeletal: Normal range of motion. She exhibits no edema and no tenderness.  Lymphadenopathy:    She has no cervical adenopathy.  Skin: Skin is warm and dry. No rash noted. She is not diaphoretic. No erythema. No pallor.  Psychiatric: She has a normal mood and affect. Her behavior is normal. Judgment and thought content normal.          Assessment & Plan:

## 2013-06-17 NOTE — Assessment & Plan Note (Signed)
Will treat the infection with Zpak and will control the cough with phenergan-dm 

## 2013-06-17 NOTE — Patient Instructions (Signed)

## 2013-06-17 NOTE — Assessment & Plan Note (Signed)
I will check her CXR today I have asked her to see pulm to consider having PFT's done to see if she has developed smoking related lung disease

## 2013-07-10 ENCOUNTER — Other Ambulatory Visit: Payer: Self-pay

## 2013-07-10 DIAGNOSIS — I10 Essential (primary) hypertension: Secondary | ICD-10-CM

## 2013-07-10 MED ORDER — OLMESARTAN-AMLODIPINE-HCTZ 40-5-12.5 MG PO TABS: 1.0000 | ORAL_TABLET | Freq: Every day | ORAL | Status: DC

## 2013-07-13 ENCOUNTER — Ambulatory Visit (INDEPENDENT_AMBULATORY_CARE_PROVIDER_SITE_OTHER): Payer: BC Managed Care – PPO | Admitting: Internal Medicine

## 2013-07-13 ENCOUNTER — Encounter: Payer: Self-pay | Admitting: Internal Medicine

## 2013-07-13 VITALS — BP 160/100 | HR 84 | Ht 59.5 in | Wt 173.8 lb

## 2013-07-13 DIAGNOSIS — R05 Cough: Secondary | ICD-10-CM

## 2013-07-13 DIAGNOSIS — R0602 Shortness of breath: Secondary | ICD-10-CM | POA: Insufficient documentation

## 2013-07-13 NOTE — Patient Instructions (Addendum)
#  CLINICAL DATA: Smoker with cough, shortness of breath and chest  pain.  EXAM:  CHEST 2 VIEW  COMPARISON: 12/19/2010 radiographs.  FINDINGS:  The heart size and mediastinal contours are normal. The lungs are  clear. There is no pleural effusion or pneumothorax. No acute  osseous findings are identified. Mild prominence of the retrosternal  clear space on the lateral view is unchanged.  IMPRESSION:  Stable examination. No acute cardiopulmonary process.  Electronically Signed  By: Roxy Horseman  On: 06/17/2013 11:05

## 2013-07-13 NOTE — Assessment & Plan Note (Signed)
Etiology unclear but we will work this in tandem with chronic cough evaluation. We'll do pulmonary function test and then decide about further imaging versus cardio pulmonary stress test

## 2013-07-13 NOTE — Progress Notes (Signed)
Subjective:    Patient ID: Sarah Watts, female    DOB: [birth year], 47 y.o.   MRN: [id number] IOV 07/13/2013   HPI IOV 07/13/2013  47 year old female referred for chronic cough and shortness of breath  Cough: Insidious onset. Present for few to several years. Progressive for the last year. Severity is moderate to severe. Mostly dry cough but occasionally has white phlegm with it. No clear cut aggravating or relieving factors for cough. Cough is associated with sinus drainage, and she takes a nasal steroid for this. She denies acid reflux disease. Cough is also associated with constant clearing of the throat, feeling of tickle in the throat and gagging. There is a laryngeal quality to the cough. RSI cough score is 36 and refelcts LPR Cough. Kouffman cough differentiator: 4 of GERD and 5 for neurogeniuc/airway cough  Dyspnea: Insidious onset. Present for the last 1 year. Moderate in severity. Stable since onset. 2 years ago she could walk to floor at [facility] without any problems but currently she gets dyspneic for the same activity. This is relieved by rest and is associated dry cough. She's not had any medications for the same.  Is no associated orthopnea, paroxysmal nocturnal dyspnea or edema or hemoptysis  Dyspnea and cough relevant hx   General   Body mass index is 34.53 kg/(m^2).: She has gained several pounds in the last few years but she does not know much :   Exposure  - Smokes on and off cigarettes for the last 7 years   GI history  - Denies acid reflux disease or medication for the same  Hypertension  - Not on ACE inhibitor  Pulmonaryh hx  - denies any known pulmonary diagnosis  LABS  Walk test 185 feet x 3 laps on ROOM AIR: DID NOT DESATURATE  CXR 06/17/13 IMPRESSION:  Stable examination. No acute cardiopulmonary process.       Dr Gretta Cool Reflux Symptom Index (> 13-15 suggestive of LPR cough) 07/13/2013   Hoarseness of problem with voice 2  Clearing  Of  Throat 5  Excess throat mucus or feeling of post nasal drip 5  Difficulty swallowing food, liquid or tablets 3  Cough after eating or lying down 3  Breathing difficulties or choking episodes 5  Troublesome or annoying cough 5  Sensation of something sticking in throat or lump in throat 5  Heartburn, chest pain, indigestion, or stomach acid coming up 3  TOTAL 36     Kouffman Reflux v Neurogenic Cough Differentiator Reflux 07/13/2013   Do you awaken from a sound sleep coughing violently?                            With trouble breathing? Yes  Do you have choking episodes when you cannot  Get enough air, gasping for air ?              Yes  Do you usually cough when you lie down into  The bed, or when you just lie down to rest ?                          Yes  Do you usually cough after meals or eating?         Yes  Do you cough when (or after) you bend over?    no  GERD SCORE  4  Kouffman Reflux v Neurogenic Cough Differentiator Neurogenic  Do you  more-or-less cough all day long? y  Does change of temperature make you cough? y  Does laughing or chuckling cause you to cough? y  Do fumes (perfume, automobile fumes, burned  Toast, etc.,) cause you to cough ?      y  Does speaking, singing, or talking on the phone cause you to cough   ?               y  Neurogenic/Airway score 5     Past Medical History  Diagnosis Date  . Allergic rhinitis   . HTN (hypertension)   . Heart murmur   . Irregular heartbeat      Family History  Problem Relation Age of Onset  . Diabetes Other     1st degree relative  . Hypertension Other   . Prostate cancer Other     1st degree relative <50  . Allergies Mother   . Breast cancer Mother   . Allergies Father   . Allergies Sister   . Asthma Sister   . Colon cancer Neg Hx   . Heart disease Father      History   Social History  . Marital Status: Divorced    Spouse Name: N/A    Number of Children: Y  . Years of Education: N/A    Occupational History  . Produce clerk [employer]   Social History Main Topics  . Smoking status: Former Smoker -- 0.50 packs/day for 7 years    Types: Cigarettes    Quit date: 06/29/2013  . Smokeless tobacco: Never Used  . Alcohol Use: No  . Drug Use: No  . Sexual Activity: Yes     Comment: female partner   Other Topics Concern  . 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 Codeine     REACTION: tachycardia  . Shellfish Allergy      Outpatient Prescriptions Prior to Visit  Medication Sig Dispense Refill  . fluticasone (FLONASE) 50 MCG/ACT nasal spray Place 2 sprays into the nose daily.  16 g  5  . loratadine (CLARITIN) 10 MG tablet Take 10 mg by mouth daily.        . Olmesartan-Amlodipine-HCTZ (TRIBENZOR) 40-5-12.5 MG TABS Take 1 tablet by mouth daily.  90 tablet  3  . azithromycin (ZITHROMAX) 500 MG tablet Take 1 tablet (500 mg total) by mouth daily.  3 tablet  0  . promethazine-dextromethorphan (PROMETHAZINE-DM) 6.25-15 MG/5ML syrup Take 5 mL by mouth 4 (four) times daily as needed for cough.  118 mL  0   No facility-administered medications prior to visit.        Review of Systems  Constitutional: Negative for fever and unexpected weight change.  HENT: Positive for congestion. Negative for ear pain, nosebleeds, sore throat, rhinorrhea, sneezing, trouble swallowing, dental problem, postnasal drip and sinus pressure.   Eyes: Negative for redness and itching.  Respiratory: Positive for cough and shortness of breath. Negative for chest tightness and wheezing.   Cardiovascular: Negative for palpitations and leg swelling.  Gastrointestinal: Negative for nausea and vomiting.  Genitourinary: Negative for dysuria.  Musculoskeletal: Negative for joint swelling.  Skin: Negative for rash.  Neurological: Negative for headaches.  Hematological: Does not bruise/bleed easily.   Psychiatric/Behavioral: Negative for dysphoric mood. The patient is not nervous/anxious.        Objective:   Physical Exam  Vitals reviewed. Constitutional: She is oriented to person, place, and time. She appears well-developed and well-nourished. No distress.  HENT:  Head: Normocephalic and atraumatic.  Right Ear: External ear normal.  Left Ear: External ear normal.  Mouth/Throat: Oropharynx is clear and moist. No oropharyngeal exudate.  Eyes: Conjunctivae and EOM are normal. Pupils are equal, round, and reactive to light. Right eye exhibits no discharge. Left eye exhibits no discharge. No scleral icterus.  Neck: Normal range of motion. Neck supple. No JVD present. No tracheal deviation present. No thyromegaly present.  Cardiovascular: Normal rate, regular rhythm, normal heart sounds and intact distal pulses.  Exam reveals no gallop and no friction rub.   No murmur heard. Pulmonary/Chest: Effort normal and breath sounds normal. No respiratory distress. She has no wheezes. She has no rales. She exhibits no tenderness.  Abdominal: Soft. Bowel sounds are normal. She exhibits no distension and no mass. There is no tenderness. There is no rebound and no guarding.  Musculoskeletal: Normal range of motion. She exhibits no edema and no tenderness.  Lymphadenopathy:    She has no cervical adenopathy.  Neurological: She is alert and oriented to person, place, and time. She has normal reflexes. No cranial nerve deficit. She exhibits normal muscle tone. Coordination normal.  Skin: Skin is warm and dry. No rash noted. She is not diaphoretic. No erythema. No pallor.  Psychiatric: She has a normal mood and affect. Her behavior is normal. Judgment and thought content normal.          Assessment & Plan:

## 2013-07-13 NOTE — Assessment & Plan Note (Signed)
She is also features to suggest that her chronic cough is from irritable larynx syndrome otherwise called laryngopharyngeal reflux syndrome. Driving this are mainly sinus, possible silent acid reflux, possible cough variant asthma  And we'll first address the sinus issue and optimize her treatment start empiric treatment for acid reflux disease with proton pump inhibitor See how the above work out over the next month Return for followup in one month with pulmonary function test

## 2013-08-10 ENCOUNTER — Other Ambulatory Visit: Payer: Self-pay

## 2013-08-10 MED ORDER — FLUTICASONE PROPIONATE 50 MCG/ACT NA SUSP: 2.0000 | Freq: Every day | NASAL | Status: DC

## 2013-08-19 ENCOUNTER — Ambulatory Visit (INDEPENDENT_AMBULATORY_CARE_PROVIDER_SITE_OTHER): Payer: BC Managed Care – PPO | Admitting: Adult Health

## 2013-08-19 ENCOUNTER — Encounter: Payer: Self-pay | Admitting: Adult Health

## 2013-08-19 ENCOUNTER — Ambulatory Visit (INDEPENDENT_AMBULATORY_CARE_PROVIDER_SITE_OTHER): Payer: BC Managed Care – PPO | Admitting: Internal Medicine

## 2013-08-19 VITALS — BP 144/88 | HR 90 | Temp 98.8°F | Ht 60.0 in | Wt 176.0 lb

## 2013-08-19 DIAGNOSIS — R05 Cough: Secondary | ICD-10-CM

## 2013-08-19 DIAGNOSIS — R0602 Shortness of breath: Secondary | ICD-10-CM

## 2013-08-19 LAB — PULMONARY FUNCTION TEST

## 2013-08-19 NOTE — Patient Instructions (Signed)
Great Job on stopping smoking , we are very proud of you.  Continue on Nexium daily before meal  GERD diet  Saline nasal rinses As needed   Follow up Dr. Marchelle Gearing in 6-8 weeks and As needed

## 2013-08-19 NOTE — Progress Notes (Signed)
PFT done today. 

## 2013-08-19 NOTE — Assessment & Plan Note (Signed)
Improved with trigger control -GERD prevention /PPI PFT normal   Plan  Great Job on stopping smoking , we are very proud of you.  Continue on Nexium daily before meal  GERD diet  Saline nasal rinses As needed   Follow up Dr. Marchelle Gearing in 6-8 weeks and As needed

## 2013-08-19 NOTE — Progress Notes (Signed)
Subjective:    Patient ID: Sarah Watts, female    DOB: 03-25-1966, 47 y.o.   MRN: [id number] IOV 07/13/2013   HPI IOV 07/13/2013  47 year old female referred for chronic cough and shortness of breath  Cough: Insidious onset. Present for few to several years. Progressive for the last year. Severity is moderate to severe. Mostly dry cough but occasionally has white phlegm with it. No clear cut aggravating or relieving factors for cough. Cough is associated with sinus drainage, and she takes a nasal steroid for this. She denies acid reflux disease. Cough is also associated with constant clearing of the throat, feeling of tickle in the throat and gagging. There is a laryngeal quality to the cough. RSI cough score is 36 and refelcts LPR Cough. Kouffman cough differentiator: 4 of GERD and 5 for neurogeniuc/airway cough  Dyspnea: Insidious onset. Present for the last 1 year. Moderate in severity. Stable since onset. 2 years ago she could walk to floor at [facility] without any problems but currently she gets dyspneic for the same activity. This is relieved by rest and is associated dry cough. She's not had any medications for the same.  Is no associated orthopnea, paroxysmal nocturnal dyspnea or edema or hemoptysis  Dyspnea and cough relevant hx   General   Body mass index is 34.53 kg/(m^2).: She has gained several pounds in the last few years but she does not know much :   Exposure  - Smokes on and off cigarettes for the last 7 years   GI history  - Denies acid reflux disease or medication for the same  Hypertension  - Not on ACE inhibitor  Pulmonaryh hx  - denies any known pulmonary diagnosis  LABS  Walk test 185 feet x 3 laps on ROOM AIR: DID NOT DESATURATE  CXR 06/17/13 IMPRESSION:  Stable examination. No acute cardiopulmonary process.      08/19/2013 Follow up OV -Cough /PFT  Pt returns for a follow up for recent pulmonary consult for cough.  She had PFT today that  showed FEV1 2.09 L (84%) , ratio 82, no sign change w/ BD. Mid flows did show some reversibility with BD. DLCO 104%.  Since last ov she feels her  breathing is doing well since last visit.  no new complaints. Cough has almost stopped completely . No wheezing or chest pain.  She was started on PPI , sinus rinses.  She has stopped smoking .  Feels much better.    Review of Systems  Constitutional: Negative for fever and unexpected weight change.  HENT Negative for ear pain, nosebleeds, sore throat, rhinorrhea, sneezing, trouble swallowing, dental problem, postnasal drip and sinus pressure.   Eyes: Negative for redness and itching.  Respiratory:   Negative for chest tightness and wheezing.   Cardiovascular: Negative for palpitations and leg swelling.  Gastrointestinal: Negative for nausea and vomiting.  Genitourinary: Negative for dysuria.  Musculoskeletal: Negative for joint swelling.  Skin: Negative for rash.  Neurological: Negative for headaches.  Hematological: Does not bruise/bleed easily.  Psychiatric/Behavioral: Negative for dysphoric mood. The patient is not nervous/anxious.        Objective:   GEN: A/Ox3; pleasant , NAD, well nourished   HEENT:  [place]/AT,  EACs-clear, TMs-wnl, NOSE-clear, THROAT-clear, no lesions, no postnasal drip or exudate noted.   NECK:  Supple w/ fair ROM; no JVD; normal carotid impulses w/o bruits; no thyromegaly or nodules palpated; no lymphadenopathy.  RESP  Clear  P & A; w/o, wheezes/ rales/ or rhonchi.no  accessory muscle use, no dullness to percussion  CARD:  RRR, no m/r/g  , no peripheral edema, pulses intact, no cyanosis or clubbing.  GI:   Soft & nt; nml bowel sounds; no organomegaly or masses detected.  Musco: Warm bil, no deformities or joint swelling noted.   Neuro: alert, no focal deficits noted.    Skin: Warm, no lesions or rashes       Assessment & Plan:

## 2013-08-19 NOTE — Assessment & Plan Note (Addendum)
Dyspnea improved with smoking cessation and cough control  No airflow obstruction on PFT  Diffusing capacity normal   Plan  Great Job on stopping smoking , we are very proud of you.  Continue on Nexium daily before meal  GERD diet  Saline nasal rinses As needed   Follow up Dr. Marchelle Gearing in 6-8 weeks and As needed

## 2013-10-06 ENCOUNTER — Ambulatory Visit: Payer: BC Managed Care – PPO | Admitting: Internal Medicine

## 2013-10-09 ENCOUNTER — Encounter: Payer: Self-pay | Admitting: Internal Medicine

## 2014-07-26 ENCOUNTER — Other Ambulatory Visit: Payer: Self-pay | Admitting: Internal Medicine

## 2014-07-27 ENCOUNTER — Other Ambulatory Visit: Payer: Self-pay | Admitting: Internal Medicine

## 2014-08-14 ENCOUNTER — Other Ambulatory Visit: Payer: Self-pay | Admitting: Internal Medicine

## 2014-10-29 ENCOUNTER — Other Ambulatory Visit: Payer: Self-pay | Admitting: Internal Medicine

## 2014-11-04 ENCOUNTER — Other Ambulatory Visit: Payer: Self-pay | Admitting: Internal Medicine

## 2014-11-12 ENCOUNTER — Encounter: Payer: Self-pay | Admitting: Internal Medicine

## 2014-11-12 ENCOUNTER — Other Ambulatory Visit (INDEPENDENT_AMBULATORY_CARE_PROVIDER_SITE_OTHER): Payer: BLUE CROSS/BLUE SHIELD

## 2014-11-12 ENCOUNTER — Ambulatory Visit (INDEPENDENT_AMBULATORY_CARE_PROVIDER_SITE_OTHER): Payer: BLUE CROSS/BLUE SHIELD | Admitting: Internal Medicine

## 2014-11-12 VITALS — BP 156/96 | HR 82 | Temp 98.3°F | Resp 16 | Ht 60.0 in | Wt 188.0 lb

## 2014-11-12 DIAGNOSIS — R221 Localized swelling, mass and lump, neck: Secondary | ICD-10-CM

## 2014-11-12 DIAGNOSIS — M5430 Sciatica, unspecified side: Secondary | ICD-10-CM | POA: Insufficient documentation

## 2014-11-12 DIAGNOSIS — Z23 Encounter for immunization: Secondary | ICD-10-CM

## 2014-11-12 DIAGNOSIS — Z0000 Encounter for general adult medical examination without abnormal findings: Secondary | ICD-10-CM

## 2014-11-12 DIAGNOSIS — E038 Other specified hypothyroidism: Secondary | ICD-10-CM

## 2014-11-12 DIAGNOSIS — R739 Hyperglycemia, unspecified: Secondary | ICD-10-CM

## 2014-11-12 DIAGNOSIS — R05 Cough: Secondary | ICD-10-CM

## 2014-11-12 DIAGNOSIS — R053 Chronic cough: Secondary | ICD-10-CM

## 2014-11-12 DIAGNOSIS — M5432 Sciatica, left side: Secondary | ICD-10-CM

## 2014-11-12 DIAGNOSIS — Z1231 Encounter for screening mammogram for malignant neoplasm of breast: Secondary | ICD-10-CM

## 2014-11-12 DIAGNOSIS — I10 Essential (primary) hypertension: Secondary | ICD-10-CM

## 2014-11-12 LAB — LIPID PANEL
CHOL/HDL RATIO: 5
Cholesterol: 188 mg/dL (ref 0–200)
HDL: 37 mg/dL — ABNORMAL LOW (ref >39.00)
LDL CALC: 115 mg/dL — AB (ref 0–99)
NONHDL: 151
Triglycerides: 179 mg/dL — ABNORMAL HIGH (ref 0.0–149.0)
VLDL: 35.8 mg/dL (ref 0.0–40.0)

## 2014-11-12 LAB — CBC WITH DIFFERENTIAL/PLATELET
Basophils Absolute: 0.1 10*3/uL (ref 0.0–0.1)
Basophils Relative: 0.9 % (ref 0.0–3.0)
Eosinophils Absolute: 0.2 10*3/uL (ref 0.0–0.7)
Eosinophils Relative: 2.1 % (ref 0.0–5.0)
HCT: 42.3 % (ref 36.0–46.0)
Hemoglobin: 13.9 g/dL (ref 12.0–15.0)
LYMPHS ABS: 2.3 10*3/uL (ref 0.7–4.0)
LYMPHS PCT: 22.7 % (ref 12.0–46.0)
MCHC: 33 g/dL (ref 30.0–36.0)
MCV: 85.8 fl (ref 78.0–100.0)
MONO ABS: 0.4 10*3/uL (ref 0.1–1.0)
Monocytes Relative: 3.7 % (ref 3.0–12.0)
NEUTROS ABS: 7 10*3/uL (ref 1.4–7.7)
NEUTROS PCT: 70.6 % (ref 43.0–77.0)
Platelets: 243 10*3/uL (ref 150.0–400.0)
RBC: 4.92 Mil/uL (ref 3.87–5.11)
RDW: 13.8 % (ref 11.5–15.5)
WBC: 9.9 10*3/uL (ref 4.0–10.5)

## 2014-11-12 LAB — COMPREHENSIVE METABOLIC PANEL
ALBUMIN: 4.2 g/dL (ref 3.5–5.2)
ALT: 20 U/L (ref 0–35)
AST: 18 U/L (ref 0–37)
Alkaline Phosphatase: 81 U/L (ref 39–117)
BUN: 8 mg/dL (ref 6–23)
CHLORIDE: 103 meq/L (ref 96–112)
CO2: 29 mEq/L (ref 19–32)
Calcium: 9.5 mg/dL (ref 8.4–10.5)
Creatinine, Ser: 0.75 mg/dL (ref 0.40–1.20)
GFR: 87.04 mL/min (ref >60.00)
Glucose, Bld: 110 mg/dL — ABNORMAL HIGH (ref 70–99)
POTASSIUM: 4 meq/L (ref 3.5–5.1)
SODIUM: 138 meq/L (ref 135–145)
Total Bilirubin: 0.6 mg/dL (ref 0.2–1.2)
Total Protein: 7.4 g/dL (ref 6.0–8.3)

## 2014-11-12 LAB — TSH: TSH: 17.39 u[IU]/mL — ABNORMAL HIGH (ref 0.35–4.50)

## 2014-11-12 LAB — HEMOGLOBIN A1C: HEMOGLOBIN A1C: 6.3 % (ref 4.6–6.5)

## 2014-11-12 LAB — HCG, QUANTITATIVE, PREGNANCY: QUANTITATIVE HCG: 0.76 m[IU]/mL

## 2014-11-12 MED ORDER — OLMESARTAN-AMLODIPINE-HCTZ 40-5-12.5 MG PO TABS: 1.0000 | ORAL_TABLET | Freq: Every day | ORAL | Status: DC

## 2014-11-12 MED ORDER — OLMESARTAN-AMLODIPINE-HCTZ 20-5-12.5 MG PO TABS: 1.0000 | ORAL_TABLET | Freq: Every day | ORAL | Status: DC

## 2014-11-12 NOTE — Progress Notes (Signed)
Pre visit review using our clinic review tool, if applicable. No additional management support is needed unless otherwise documented below in the visit note. 

## 2014-11-12 NOTE — Assessment & Plan Note (Signed)
Her BP is not well controlled Will restart treatment with tribenzor Will check her labs to screen for end organ damage and secondary causes of HTN

## 2014-11-12 NOTE — Assessment & Plan Note (Signed)
Vaccines were reviewed and updated PAP is UTD She was referred for a mammo Exam done Labs ordered Pt ed material was given

## 2014-11-12 NOTE — Assessment & Plan Note (Signed)
I am concerned about a recurrent thyroglossal cyst, cancer (+ positive hx of tob abuse), LAD, infection I have ordered a CT with contrast to gather more info

## 2014-11-12 NOTE — Progress Notes (Signed)
Subjective:    Patient ID: Sarah Watts, female    DOB: 1966/02/14, 49 y.o.   MRN: [id number]  Hypertension This is a chronic problem. The current episode started more than 1 year ago. The problem has been gradually worsening since onset. The problem is uncontrolled. Pertinent negatives include no anxiety, blurred vision, chest pain, headaches, malaise/fatigue, neck pain, orthopnea, palpitations, peripheral edema, PND, shortness of breath or sweats. There are no associated agents to hypertension. The current treatment provides no improvement. Compliance problems include psychosocial issues.       Review of Systems  Constitutional: Negative.  Negative for fever, chills, malaise/fatigue, diaphoresis, appetite change and fatigue.  HENT: Negative.  Negative for congestion, sinus pressure, trouble swallowing and voice change.        For the last few months she has had worsening pain and swelling over her left jaw/ear/neck area. She had surgery in this area years ago to remove "infection and a birth defect".  Eyes: Negative.  Negative for blurred vision.  Respiratory: Negative.  Negative for cough, choking, chest tightness, shortness of breath and stridor.   Cardiovascular: Negative.  Negative for chest pain, palpitations, orthopnea, leg swelling and PND.  Gastrointestinal: Negative.  Negative for nausea, vomiting, abdominal pain, diarrhea, constipation and blood in stool.  Endocrine: Negative.   Genitourinary: Negative.   Musculoskeletal: Negative.  Negative for myalgias, back pain, arthralgias and neck pain.       She has had left lower back and hip pain for several months  Skin: Negative.  Negative for rash.  Allergic/Immunologic: Negative.   Neurological: Negative.  Negative for headaches.  Hematological: Negative.  Negative for adenopathy. Does not bruise/bleed easily.  Psychiatric/Behavioral: Negative.        Objective:   Physical Exam  Constitutional: She is oriented to person,  place, and time. She appears well-developed and well-nourished.  Non-toxic appearance. She does not have a sickly appearance. She does not appear ill. No distress.  HENT:  Head: Normocephalic and atraumatic.  Mouth/Throat: No oropharyngeal exudate.  Eyes: Conjunctivae are normal. Right eye exhibits no discharge. Left eye exhibits no discharge. No scleral icterus.  Neck: Normal range of motion. Neck supple. No JVD present. No tracheal deviation present. No thyroid mass and no thyromegaly present.    Cardiovascular: Normal rate, regular rhythm, normal heart sounds and intact distal pulses.  Exam reveals no gallop and no friction rub.   No murmur heard. Pulmonary/Chest: Effort normal and breath sounds normal. No accessory muscle usage or stridor. No respiratory distress. She has no wheezes. She has no rhonchi. She has no rales. She exhibits no tenderness and no deformity. Right breast exhibits no inverted nipple, no mass, no nipple discharge, no skin change and no tenderness. Left breast exhibits no inverted nipple, no mass, no nipple discharge, no skin change and no tenderness. Breasts are symmetrical.  Abdominal: Soft. Bowel sounds are normal. She exhibits no distension and no mass. There is no tenderness. There is no rebound and no guarding.  Musculoskeletal: She exhibits no edema or tenderness.       Lumbar back: Normal. She exhibits normal range of motion, no tenderness, no bony tenderness, no swelling, no edema, no deformity and no laceration.  Lymphadenopathy:       Head (right side): No submental, no submandibular, no tonsillar, no preauricular, no posterior auricular and no occipital adenopathy present.       Head (left side): No submental, no submandibular, no tonsillar, no preauricular, no posterior auricular and  no occipital adenopathy present.    She has cervical adenopathy.       Right cervical: No superficial cervical, no deep cervical and no posterior cervical adenopathy present.       Left cervical: Superficial cervical adenopathy present. No deep cervical and no posterior cervical adenopathy present.    She has no axillary adenopathy.       Right: No inguinal, no supraclavicular and no epitrochlear adenopathy present.       Left: No inguinal, no supraclavicular and no epitrochlear adenopathy present.  Neurological: She is oriented to person, place, and time.  Skin: Skin is warm and dry. No rash noted. She is not diaphoretic. No erythema. No pallor.  Psychiatric: She has a normal mood and affect. Her behavior is normal. Judgment and thought content normal.  Vitals reviewed.    Lab Results  Component Value Date   WBC 11.4* 02/13/2013   HGB 13.6 02/13/2013   HCT 39.8 02/13/2013   PLT 381.0 02/13/2013   GLUCOSE 111* 03/03/2013   CHOL 178 02/13/2013   TRIG 189.0* 02/13/2013   HDL 36.60* 02/13/2013   LDLCALC 104* 02/13/2013   ALT 18 02/13/2013   AST 20 02/13/2013   NA 136 03/03/2013   K 3.5 03/03/2013   CL 102 03/03/2013   CREATININE 0.7 03/03/2013   BUN 10 03/03/2013   CO2 27 03/03/2013   TSH 3.87 02/13/2013   HGBA1C 6.0 03/03/2013       Assessment & Plan:

## 2014-11-12 NOTE — Assessment & Plan Note (Signed)
I think she would benefit from a manipulation so I have referred her to sports medicine

## 2014-11-12 NOTE — Assessment & Plan Note (Signed)
Will recheck her TSH today and will treat if needed

## 2014-11-12 NOTE — Assessment & Plan Note (Signed)
I will check her A1C to see if she has developed DM2 

## 2014-11-12 NOTE — Patient Instructions (Signed)
Preventive Care for Adults A healthy lifestyle and preventive care can promote health and wellness. Preventive health guidelines for women include the following key practices.  A routine yearly physical is a good way to check with your health care provider about your health and preventive screening. It is a chance to share any concerns and updates on your health and to receive a thorough exam.  Visit your dentist for a routine exam and preventive care every 6 months. Brush your teeth twice a day and floss once a day. Good oral hygiene prevents tooth decay and gum disease.  The frequency of eye exams is based on your age, health, family medical history, use of contact lenses, and other factors. Follow your health care provider's recommendations for frequency of eye exams.  Eat a healthy diet. Foods like vegetables, fruits, whole grains, low-fat dairy products, and lean protein foods contain the nutrients you need without too many calories. Decrease your intake of foods high in solid fats, added sugars, and salt. Eat the right amount of calories for you.Get information about a proper diet from your health care provider, if necessary.  Regular physical exercise is one of the most important things you can do for your health. Most adults should get at least 150 minutes of moderate-intensity exercise (any activity that increases your heart rate and causes you to sweat) each week. In addition, most adults need muscle-strengthening exercises on 2 or more days a week.  Maintain a healthy weight. The body mass index (BMI) is a screening tool to identify possible weight problems. It provides an estimate of body fat based on height and weight. Your health care provider can find your BMI and can help you achieve or maintain a healthy weight.For adults 20 years and older:  A BMI below 18.5 is considered underweight.  A BMI of 18.5 to 24.9 is normal.  A BMI of 25 to 29.9 is considered overweight.  A BMI of  30 and above is considered obese.  Maintain normal blood lipids and cholesterol levels by exercising and minimizing your intake of saturated fat. Eat a balanced diet with plenty of fruit and vegetables. Blood tests for lipids and cholesterol should begin at age 76 and be repeated every 5 years. If your lipid or cholesterol levels are high, you are over 50, or you are at high risk for heart disease, you may need your cholesterol levels checked more frequently.Ongoing high lipid and cholesterol levels should be treated with medicines if diet and exercise are not working.  If you smoke, find out from your health care provider how to quit. If you do not use tobacco, do not start.  Lung cancer screening is recommended for adults aged 22-80 years who are at high risk for developing lung cancer because of a history of smoking. A yearly low-dose CT scan of the lungs is recommended for people who have at least a 30-pack-year history of smoking and are a current smoker or have quit within the past 15 years. A pack year of smoking is smoking an average of 1 pack of cigarettes a day for 1 year (for example: 1 pack a day for 30 years or 2 packs a day for 15 years). Yearly screening should continue until the smoker has stopped smoking for at least 15 years. Yearly screening should be stopped for people who develop a health problem that would prevent them from having lung cancer treatment.  If you are pregnant, do not drink alcohol. If you are breastfeeding,  be very cautious about drinking alcohol. If you are not pregnant and choose to drink alcohol, do not have more than 1 drink per day. One drink is considered to be 12 ounces (355 mL) of beer, 5 ounces (148 mL) of wine, or 1.5 ounces (44 mL) of liquor.  Avoid use of street drugs. Do not share needles with anyone. Ask for help if you need support or instructions about stopping the use of drugs.  High blood pressure causes heart disease and increases the risk of  stroke. Your blood pressure should be checked at least every 1 to 2 years. Ongoing high blood pressure should be treated with medicines if weight loss and exercise do not work.  If you are 75-52 years old, ask your health care provider if you should take aspirin to prevent strokes.  Diabetes screening involves taking a blood sample to check your fasting blood sugar level. This should be done once every 3 years, after age 15, if you are within normal weight and without risk factors for diabetes. Testing should be considered at a younger age or be carried out more frequently if you are overweight and have at least 1 risk factor for diabetes.  Breast cancer screening is essential preventive care for women. You should practice "breast self-awareness." This means understanding the normal appearance and feel of your breasts and may include breast self-examination. Any changes detected, no matter how small, should be reported to a health care provider. Women in their 58s and 30s should have a clinical breast exam (CBE) by a health care provider as part of a regular health exam every 1 to 3 years. After age 16, women should have a CBE every year. Starting at age 53, women should consider having a mammogram (breast X-ray test) every year. Women who have a family history of breast cancer should talk to their health care provider about genetic screening. Women at a high risk of breast cancer should talk to their health care providers about having an MRI and a mammogram every year.  Breast cancer gene (BRCA)-related cancer risk assessment is recommended for women who have family members with BRCA-related cancers. BRCA-related cancers include breast, ovarian, tubal, and peritoneal cancers. Having family members with these cancers may be associated with an increased risk for harmful changes (mutations) in the breast cancer genes BRCA1 and BRCA2. Results of the assessment will determine the need for genetic counseling and  BRCA1 and BRCA2 testing.  Routine pelvic exams to screen for cancer are no longer recommended for nonpregnant women who are considered low risk for cancer of the pelvic organs (ovaries, uterus, and vagina) and who do not have symptoms. Ask your health care provider if a screening pelvic exam is right for you.  If you have had past treatment for cervical cancer or a condition that could lead to cancer, you need Pap tests and screening for cancer for at least 20 years after your treatment. If Pap tests have been discontinued, your risk factors (such as having a new sexual partner) need to be reassessed to determine if screening should be resumed. Some women have medical problems that increase the chance of getting cervical cancer. In these cases, your health care provider may recommend more frequent screening and Pap tests.  The HPV test is an additional test that may be used for cervical cancer screening. The HPV test looks for the virus that can cause the cell changes on the cervix. The cells collected during the Pap test can be  tested for HPV. The HPV test could be used to screen women aged 30 years and older, and should be used in women of any age who have unclear Pap test results. After the age of 30, women should have HPV testing at the same frequency as a Pap test.  Colorectal cancer can be detected and often prevented. Most routine colorectal cancer screening begins at the age of 50 years and continues through age 75 years. However, your health care provider may recommend screening at an earlier age if you have risk factors for colon cancer. On a yearly basis, your health care provider may provide home test kits to check for hidden blood in the stool. Use of a small camera at the end of a tube, to directly examine the colon (sigmoidoscopy or colonoscopy), can detect the earliest forms of colorectal cancer. Talk to your health care provider about this at age 50, when routine screening begins. Direct  exam of the colon should be repeated every 5-10 years through age 75 years, unless early forms of pre-cancerous polyps or small growths are found.  People who are at an increased risk for hepatitis B should be screened for this virus. You are considered at high risk for hepatitis B if:  You were born in a country where hepatitis B occurs often. Talk with your health care provider about which countries are considered high risk.  Your parents were born in a high-risk country and you have not received a shot to protect against hepatitis B (hepatitis B vaccine).  You have HIV or AIDS.  You use needles to inject street drugs.  You live with, or have sex with, someone who has hepatitis B.  You get hemodialysis treatment.  You take certain medicines for conditions like cancer, organ transplantation, and autoimmune conditions.  Hepatitis C blood testing is recommended for all people born from 1945 through 1965 and any individual with known risks for hepatitis C.  Practice safe sex. Use condoms and avoid high-risk sexual practices to reduce the spread of sexually transmitted infections (STIs). STIs include gonorrhea, chlamydia, syphilis, trichomonas, herpes, HPV, and human immunodeficiency virus (HIV). Herpes, HIV, and HPV are viral illnesses that have no cure. They can result in disability, cancer, and death.  You should be screened for sexually transmitted illnesses (STIs) including gonorrhea and chlamydia if:  You are sexually active and are younger than 24 years.  You are older than 24 years and your health care provider tells you that you are at risk for this type of infection.  Your sexual activity has changed since you were last screened and you are at an increased risk for chlamydia or gonorrhea. Ask your health care provider if you are at risk.  If you are at risk of being infected with HIV, it is recommended that you take a prescription medicine daily to prevent HIV infection. This is  called preexposure prophylaxis (PrEP). You are considered at risk if:  You are a heterosexual woman, are sexually active, and are at increased risk for HIV infection.  You take drugs by injection.  You are sexually active with a partner who has HIV.  Talk with your health care provider about whether you are at high risk of being infected with HIV. If you choose to begin PrEP, you should first be tested for HIV. You should then be tested every 3 months for as long as you are taking PrEP.  Osteoporosis is a disease in which the bones lose minerals and strength   with aging. This can result in serious bone fractures or breaks. The risk of osteoporosis can be identified using a bone density scan. Women ages 65 years and over and women at risk for fractures or osteoporosis should discuss screening with their health care providers. Ask your health care provider whether you should take a calcium supplement or vitamin D to reduce the rate of osteoporosis.  Menopause can be associated with physical symptoms and risks. Hormone replacement therapy is available to decrease symptoms and risks. You should talk to your health care provider about whether hormone replacement therapy is right for you.  Use sunscreen. Apply sunscreen liberally and repeatedly throughout the day. You should seek shade when your shadow is shorter than you. Protect yourself by wearing long sleeves, pants, a wide-brimmed hat, and sunglasses year round, whenever you are outdoors.  Once a month, do a whole body skin exam, using a mirror to look at the skin on your back. Tell your health care provider of new moles, moles that have irregular borders, moles that are larger than a pencil eraser, or moles that have changed in shape or color.  Stay current with required vaccines (immunizations).  Influenza vaccine. All adults should be immunized every year.  Tetanus, diphtheria, and acellular pertussis (Td, Tdap) vaccine. Pregnant women should  receive 1 dose of Tdap vaccine during each pregnancy. The dose should be obtained regardless of the length of time since the last dose. Immunization is preferred during the 27th-36th week of gestation. An adult who has not previously received Tdap or who does not know her vaccine status should receive 1 dose of Tdap. This initial dose should be followed by tetanus and diphtheria toxoids (Td) booster doses every 10 years. Adults with an unknown or incomplete history of completing a 3-dose immunization series with Td-containing vaccines should begin or complete a primary immunization series including a Tdap dose. Adults should receive a Td booster every 10 years.  Varicella vaccine. An adult without evidence of immunity to varicella should receive 2 doses or a second dose if she has previously received 1 dose. Pregnant females who do not have evidence of immunity should receive the first dose after pregnancy. This first dose should be obtained before leaving the health care facility. The second dose should be obtained 4-8 weeks after the first dose.  Human papillomavirus (HPV) vaccine. Females aged 13-26 years who have not received the vaccine previously should obtain the 3-dose series. The vaccine is not recommended for use in pregnant females. However, pregnancy testing is not needed before receiving a dose. If a female is found to be pregnant after receiving a dose, no treatment is needed. In that case, the remaining doses should be delayed until after the pregnancy. Immunization is recommended for any person with an immunocompromised condition through the age of 26 years if she did not get any or all doses earlier. During the 3-dose series, the second dose should be obtained 4-8 weeks after the first dose. The third dose should be obtained 24 weeks after the first dose and 16 weeks after the second dose.  Zoster vaccine. One dose is recommended for adults aged 60 years or older unless certain conditions are  present.  Measles, mumps, and rubella (MMR) vaccine. Adults born before 1957 generally are considered immune to measles and mumps. Adults born in 1957 or later should have 1 or more doses of MMR vaccine unless there is a contraindication to the vaccine or there is laboratory evidence of immunity to   each of the three diseases. A routine second dose of MMR vaccine should be obtained at least 28 days after the first dose for students attending postsecondary schools, health care workers, or international travelers. People who received inactivated measles vaccine or an unknown type of measles vaccine during 1963-1967 should receive 2 doses of MMR vaccine. People who received inactivated mumps vaccine or an unknown type of mumps vaccine before 1979 and are at high risk for mumps infection should consider immunization with 2 doses of MMR vaccine. For females of childbearing age, rubella immunity should be determined. If there is no evidence of immunity, females who are not pregnant should be vaccinated. If there is no evidence of immunity, females who are pregnant should delay immunization until after pregnancy. Unvaccinated health care workers born before 1957 who lack laboratory evidence of measles, mumps, or rubella immunity or laboratory confirmation of disease should consider measles and mumps immunization with 2 doses of MMR vaccine or rubella immunization with 1 dose of MMR vaccine.  Pneumococcal 13-valent conjugate (PCV13) vaccine. When indicated, a person who is uncertain of her immunization history and has no record of immunization should receive the PCV13 vaccine. An adult aged 19 years or older who has certain medical conditions and has not been previously immunized should receive 1 dose of PCV13 vaccine. This PCV13 should be followed with a dose of pneumococcal polysaccharide (PPSV23) vaccine. The PPSV23 vaccine dose should be obtained at least 8 weeks after the dose of PCV13 vaccine. An adult aged 19  years or older who has certain medical conditions and previously received 1 or more doses of PPSV23 vaccine should receive 1 dose of PCV13. The PCV13 vaccine dose should be obtained 1 or more years after the last PPSV23 vaccine dose.  Pneumococcal polysaccharide (PPSV23) vaccine. When PCV13 is also indicated, PCV13 should be obtained first. All adults aged 65 years and older should be immunized. An adult younger than age 65 years who has certain medical conditions should be immunized. Any person who resides in a nursing home or long-term care facility should be immunized. An adult smoker should be immunized. People with an immunocompromised condition and certain other conditions should receive both PCV13 and PPSV23 vaccines. People with human immunodeficiency virus (HIV) infection should be immunized as soon as possible after diagnosis. Immunization during chemotherapy or radiation therapy should be avoided. Routine use of PPSV23 vaccine is not recommended for American Indians, [place] Natives, or people younger than 65 years unless there are medical conditions that require PPSV23 vaccine. When indicated, people who have unknown immunization and have no record of immunization should receive PPSV23 vaccine. One-time revaccination 5 years after the first dose of PPSV23 is recommended for people aged 19-64 years who have chronic kidney failure, nephrotic syndrome, asplenia, or immunocompromised conditions. People who received 1-2 doses of PPSV23 before age 65 years should receive another dose of PPSV23 vaccine at age 65 years or later if at least 5 years have passed since the previous dose. Doses of PPSV23 are not needed for people immunized with PPSV23 at or after age 65 years.  Meningococcal vaccine. Adults with asplenia or persistent complement component deficiencies should receive 2 doses of quadrivalent meningococcal conjugate (MenACWY-D) vaccine. The doses should be obtained at least 2 months apart.  Microbiologists working with certain meningococcal bacteria, military recruits, people at risk during an outbreak, and people who travel to or live in countries with a high rate of meningitis should be immunized. A first-year college student up through age   21 years who is living in a residence hall should receive a dose if she did not receive a dose on or after her 16th birthday. Adults who have certain high-risk conditions should receive one or more doses of vaccine.  Hepatitis A vaccine. Adults who wish to be protected from this disease, have certain high-risk conditions, work with hepatitis A-infected animals, work in hepatitis A research labs, or travel to or work in countries with a high rate of hepatitis A should be immunized. Adults who were previously unvaccinated and who anticipate close contact with an international adoptee during the first 60 days after arrival in the [place] from a country with a high rate of hepatitis A should be immunized.  Hepatitis B vaccine. Adults who wish to be protected from this disease, have certain high-risk conditions, may be exposed to blood or other infectious body fluids, are household contacts or sex partners of hepatitis B positive people, are clients or workers in certain care facilities, or travel to or work in countries with a high rate of hepatitis B should be immunized.  Haemophilus influenzae type b (Hib) vaccine. A previously unvaccinated person with asplenia or sickle cell disease or having a scheduled splenectomy should receive 1 dose of Hib vaccine. Regardless of previous immunization, a recipient of a hematopoietic stem cell transplant should receive a 3-dose series 6-12 months after her successful transplant. Hib vaccine is not recommended for adults with HIV infection. Preventive Services / Frequency Ages 64 to 68 years  Blood pressure check.** / Every 1 to 2 years.  Lipid and cholesterol check.** / Every 5 years beginning at age  22.  Clinical breast exam.** / Every 3 years for women in their 88s and 53s.  BRCA-related cancer risk assessment.** / For women who have family members with a BRCA-related cancer (breast, ovarian, tubal, or peritoneal cancers).  Pap test.** / Every 2 years from ages 90 through 51. Every 3 years starting at age 21 through age 56 or 3 with a history of 3 consecutive normal Pap tests.  HPV screening.** / Every 3 years from ages 24 through ages 1 to 46 with a history of 3 consecutive normal Pap tests.  Hepatitis C blood test.** / For any individual with known risks for hepatitis C.  Skin self-exam. / Monthly.  Influenza vaccine. / Every year.  Tetanus, diphtheria, and acellular pertussis (Tdap, Td) vaccine.** / Consult your health care provider. Pregnant women should receive 1 dose of Tdap vaccine during each pregnancy. 1 dose of Td every 10 years.  Varicella vaccine.** / Consult your health care provider. Pregnant females who do not have evidence of immunity should receive the first dose after pregnancy.  HPV vaccine. / 3 doses over 6 months, if 72 and younger. The vaccine is not recommended for use in pregnant females. However, pregnancy testing is not needed before receiving a dose.  Measles, mumps, rubella (MMR) vaccine.** / You need at least 1 dose of MMR if you were born in 1957 or later. You may also need a 2nd dose. For females of childbearing age, rubella immunity should be determined. If there is no evidence of immunity, females who are not pregnant should be vaccinated. If there is no evidence of immunity, females who are pregnant should delay immunization until after pregnancy.  Pneumococcal 13-valent conjugate (PCV13) vaccine.** / Consult your health care provider.  Pneumococcal polysaccharide (PPSV23) vaccine.** / 1 to 2 doses if you smoke cigarettes or if you have certain conditions.  Meningococcal vaccine.** /  1 dose if you are age 19 to 21 years and a first-year college  student living in a residence hall, or have one of several medical conditions, you need to get vaccinated against meningococcal disease. You may also need additional booster doses.  Hepatitis A vaccine.** / Consult your health care provider.  Hepatitis B vaccine.** / Consult your health care provider.  Haemophilus influenzae type b (Hib) vaccine.** / Consult your health care provider. Ages 40 to 64 years  Blood pressure check.** / Every 1 to 2 years.  Lipid and cholesterol check.** / Every 5 years beginning at age 20 years.  Lung cancer screening. / Every year if you are aged 55-80 years and have a 30-pack-year history of smoking and currently smoke or have quit within the past 15 years. Yearly screening is stopped once you have quit smoking for at least 15 years or develop a health problem that would prevent you from having lung cancer treatment.  Clinical breast exam.** / Every year after age 40 years.  BRCA-related cancer risk assessment.** / For women who have family members with a BRCA-related cancer (breast, ovarian, tubal, or peritoneal cancers).  Mammogram.** / Every year beginning at age 40 years and continuing for as long as you are in good health. Consult with your health care provider.  Pap test.** / Every 3 years starting at age 30 years through age 65 or 70 years with a history of 3 consecutive normal Pap tests.  HPV screening.** / Every 3 years from ages 30 years through ages 65 to 70 years with a history of 3 consecutive normal Pap tests.  Fecal occult blood test (FOBT) of stool. / Every year beginning at age 50 years and continuing until age 75 years. You may not need to do this test if you get a colonoscopy every 10 years.  Flexible sigmoidoscopy or colonoscopy.** / Every 5 years for a flexible sigmoidoscopy or every 10 years for a colonoscopy beginning at age 50 years and continuing until age 75 years.  Hepatitis C blood test.** / For all people born from 1945 through  1965 and any individual with known risks for hepatitis C.  Skin self-exam. / Monthly.  Influenza vaccine. / Every year.  Tetanus, diphtheria, and acellular pertussis (Tdap/Td) vaccine.** / Consult your health care provider. Pregnant women should receive 1 dose of Tdap vaccine during each pregnancy. 1 dose of Td every 10 years.  Varicella vaccine.** / Consult your health care provider. Pregnant females who do not have evidence of immunity should receive the first dose after pregnancy.  Zoster vaccine.** / 1 dose for adults aged 60 years or older.  Measles, mumps, rubella (MMR) vaccine.** / You need at least 1 dose of MMR if you were born in 1957 or later. You may also need a 2nd dose. For females of childbearing age, rubella immunity should be determined. If there is no evidence of immunity, females who are not pregnant should be vaccinated. If there is no evidence of immunity, females who are pregnant should delay immunization until after pregnancy.  Pneumococcal 13-valent conjugate (PCV13) vaccine.** / Consult your health care provider.  Pneumococcal polysaccharide (PPSV23) vaccine.** / 1 to 2 doses if you smoke cigarettes or if you have certain conditions.  Meningococcal vaccine.** / Consult your health care provider.  Hepatitis A vaccine.** / Consult your health care provider.  Hepatitis B vaccine.** / Consult your health care provider.  Haemophilus influenzae type b (Hib) vaccine.** / Consult your health care provider. Ages 65   years and over  Blood pressure check.** / Every 1 to 2 years.  Lipid and cholesterol check.** / Every 5 years beginning at age 43 years.  Lung cancer screening. / Every year if you are aged 35-80 years and have a 30-pack-year history of smoking and currently smoke or have quit within the past 15 years. Yearly screening is stopped once you have quit smoking for at least 15 years or develop a health problem that would prevent you from having lung cancer  treatment.  Clinical breast exam.** / Every year after age 46 years.  BRCA-related cancer risk assessment.** / For women who have family members with a BRCA-related cancer (breast, ovarian, tubal, or peritoneal cancers).  Mammogram.** / Every year beginning at age 11 years and continuing for as long as you are in good health. Consult with your health care provider.  Pap test.** / Every 3 years starting at age 16 years through age 46 or 64 years with 3 consecutive normal Pap tests. Testing can be stopped between 65 and 70 years with 3 consecutive normal Pap tests and no abnormal Pap or HPV tests in the past 10 years.  HPV screening.** / Every 3 years from ages 50 years through ages 87 or 38 years with a history of 3 consecutive normal Pap tests. Testing can be stopped between 65 and 70 years with 3 consecutive normal Pap tests and no abnormal Pap or HPV tests in the past 10 years.  Fecal occult blood test (FOBT) of stool. / Every year beginning at age 26 years and continuing until age 76 years. You may not need to do this test if you get a colonoscopy every 10 years.  Flexible sigmoidoscopy or colonoscopy.** / Every 5 years for a flexible sigmoidoscopy or every 10 years for a colonoscopy beginning at age 64 years and continuing until age 58 years.  Hepatitis C blood test.** / For all people born from [birth year] through 1966 and any individual with known risks for hepatitis C.  Osteoporosis screening.** / A one-time screening for women ages 25 years and over and women at risk for fractures or osteoporosis.  Skin self-exam. / Monthly.  Influenza vaccine. / Every year.  Tetanus, diphtheria, and acellular pertussis (Tdap/Td) vaccine.** / 1 dose of Td every 10 years.  Varicella vaccine.** / Consult your health care provider.  Zoster vaccine.** / 1 dose for adults aged 62 years or older.  Pneumococcal 13-valent conjugate (PCV13) vaccine.** / Consult your health care provider.  Pneumococcal  polysaccharide (PPSV23) vaccine.** / 1 dose for all adults aged 45 years and older.  Meningococcal vaccine.** / Consult your health care provider.  Hepatitis A vaccine.** / Consult your health care provider.  Hepatitis B vaccine.** / Consult your health care provider.  Haemophilus influenzae type b (Hib) vaccine.** / Consult your health care provider. ** Family history and personal history of risk and conditions may change your health care provider's recommendations. Document Released: 12/11/2001 Document Revised: 03/01/2014 Document Reviewed: 03/12/2011 [facility] Patient Information 2015 [place], [place]. This information is not intended to replace advice given to you by your health care provider. Make sure you discuss any questions you have with your health care provider. Hypertension Hypertension, commonly called high blood pressure, is when the force of blood pumping through your arteries is too strong. Your arteries are the blood vessels that carry blood from your heart throughout your body. A blood pressure reading consists of a higher number over a lower number, such as 110/72. The higher number (  systolic) is the pressure inside your arteries when your heart pumps. The lower number (diastolic) is the pressure inside your arteries when your heart relaxes. Ideally you want your blood pressure below 120/80. Hypertension forces your heart to work harder to pump blood. Your arteries may become narrow or stiff. Having hypertension puts you at risk for heart disease, stroke, and other problems.  RISK FACTORS Some risk factors for high blood pressure are controllable. Others are not.  Risk factors you cannot control include:   Race. You may be at higher risk if you are African American.  Age. Risk increases with age.  Gender. Men are at higher risk than women before age 35 years. After age 25, women are at higher risk than men. Risk factors you can control include:  Not getting enough exercise  or physical activity.  Being overweight.  Getting too much fat, sugar, calories, or salt in your diet.  Drinking too much alcohol. SIGNS AND SYMPTOMS Hypertension does not usually cause signs or symptoms. Extremely high blood pressure (hypertensive crisis) may cause headache, anxiety, shortness of breath, and nosebleed. DIAGNOSIS  To check if you have hypertension, your health care provider will measure your blood pressure while you are seated, with your arm held at the level of your heart. It should be measured at least twice using the same arm. Certain conditions can cause a difference in blood pressure between your right and left arms. A blood pressure reading that is higher than normal on one occasion does not mean that you need treatment. If one blood pressure reading is high, ask your health care provider about having it checked again. TREATMENT  Treating high blood pressure includes making lifestyle changes and possibly taking medicine. Living a healthy lifestyle can help lower high blood pressure. You may need to change some of your habits. Lifestyle changes may include:  Following the DASH diet. This diet is high in fruits, vegetables, and whole grains. It is low in salt, red meat, and added sugars.  Getting at least 2 hours of brisk physical activity every week.  Losing weight if necessary.  Not smoking.  Limiting alcoholic beverages.  Learning ways to reduce stress. If lifestyle changes are not enough to get your blood pressure under control, your health care provider may prescribe medicine. You may need to take more than one. Work closely with your health care provider to understand the risks and benefits. HOME CARE INSTRUCTIONS  Have your blood pressure rechecked as directed by your health care provider.   Take medicines only as directed by your health care provider. Follow the directions carefully. Blood pressure medicines must be taken as prescribed. The medicine does  not work as well when you skip doses. Skipping doses also puts you at risk for problems.   Do not smoke.   Monitor your blood pressure at home as directed by your health care provider. SEEK MEDICAL CARE IF:   You think you are having a reaction to medicines taken.  You have recurrent headaches or feel dizzy.  You have swelling in your ankles.  You have trouble with your vision. SEEK IMMEDIATE MEDICAL CARE IF:  You develop a severe headache or confusion.  You have unusual weakness, numbness, or feel faint.  You have severe chest or abdominal pain.  You vomit repeatedly.  You have trouble breathing. MAKE SURE YOU:   Understand these instructions.  Will watch your condition.  Will get help right away if you are not doing well or get  worse. Document Released: 10/15/2005 Document Revised: 03/01/2014 Document Reviewed: 08/07/2013 [facility] Patient Information 2015 [place], [place]. This information is not intended to replace advice given to you by your health care provider. Make sure you discuss any questions you have with your health care provider.

## 2014-12-09 ENCOUNTER — Inpatient Hospital Stay: Admission: RE | Admit: 2014-12-09 | Payer: BLUE CROSS/BLUE SHIELD | Source: Ambulatory Visit

## 2014-12-10 ENCOUNTER — Telehealth: Payer: Self-pay | Admitting: Internal Medicine

## 2014-12-10 NOTE — Telephone Encounter (Signed)
Pt was a no show for CT appt yesterday at 3pm.  She tried calling her.  She was unable to get her.  Just FYI

## 2014-12-10 NOTE — Telephone Encounter (Signed)
Pt called in said that she works from [employer] and just got message about test?  What does she need to do?

## 2014-12-13 ENCOUNTER — Encounter: Payer: Self-pay | Admitting: Internal Medicine

## 2014-12-14 NOTE — Telephone Encounter (Addendum)
Called pt back. No answer and vm is full. She needs to call [facility] at [phone] to reschedule her CT scan.

## 2014-12-14 NOTE — Telephone Encounter (Signed)
Routed to [facility] to advise on referral and new appointment. Thanks

## 2014-12-28 ENCOUNTER — Encounter: Payer: Self-pay | Admitting: Internal Medicine

## 2014-12-28 ENCOUNTER — Telehealth: Payer: Self-pay | Admitting: Internal Medicine

## 2014-12-28 ENCOUNTER — Ambulatory Visit (INDEPENDENT_AMBULATORY_CARE_PROVIDER_SITE_OTHER)
Admission: RE | Admit: 2014-12-28 | Discharge: 2014-12-28 | Disposition: A | Discharge status: Home / Self Care | Payer: BLUE CROSS/BLUE SHIELD | Source: Ambulatory Visit | Attending: Internal Medicine | Admitting: Internal Medicine

## 2014-12-28 ENCOUNTER — Other Ambulatory Visit: Payer: Self-pay | Admitting: Internal Medicine

## 2014-12-28 DIAGNOSIS — R221 Localized swelling, mass and lump, neck: Secondary | ICD-10-CM

## 2014-12-28 DIAGNOSIS — E038 Other specified hypothyroidism: Secondary | ICD-10-CM

## 2014-12-28 MED ORDER — IOHEXOL 300 MG/ML  SOLN
75.0000 mL | Freq: Once | INTRAMUSCULAR | Status: AC | PRN
  Administered 2014-12-28: 75 mL via INTRAVENOUS

## 2014-12-28 MED ORDER — LEVOTHYROXINE SODIUM 75 MCG PO TABS: 75.0000 ug | ORAL_TABLET | Freq: Every day | ORAL | Status: DC

## 2014-12-28 NOTE — Telephone Encounter (Signed)
Patient said she received two blood pressure medications and did not get a prescription for her Thyroid.

## 2014-12-28 NOTE — Telephone Encounter (Signed)
Rx was sent to her pharmacy.

## 2015-01-04 ENCOUNTER — Ambulatory Visit (INDEPENDENT_AMBULATORY_CARE_PROVIDER_SITE_OTHER): Payer: BLUE CROSS/BLUE SHIELD | Admitting: Internal Medicine

## 2015-01-04 VITALS — BP 148/88 | HR 88 | Temp 97.9°F | Resp 16 | Ht 60.0 in | Wt 188.0 lb

## 2015-01-04 DIAGNOSIS — I10 Essential (primary) hypertension: Secondary | ICD-10-CM

## 2015-01-04 DIAGNOSIS — E890 Postprocedural hypothyroidism: Secondary | ICD-10-CM

## 2015-01-04 DIAGNOSIS — R221 Localized swelling, mass and lump, neck: Secondary | ICD-10-CM

## 2015-01-04 NOTE — Progress Notes (Signed)
   Subjective:    Patient ID: Sarah Watts, female    DOB: [birth year], 49 y.o.   MRN: [id number]  Thyroid Problem Presents for follow-up visit. Symptoms include fatigue. Patient reports no anxiety, cold intolerance, constipation, depressed mood, diaphoresis, diarrhea, dry skin, hair loss, heat intolerance, hoarse voice, leg swelling, menstrual problem, nail problem, palpitations, tremors, visual change, weight gain or weight loss. The symptoms have been stable. Past treatments include levothyroxine. The treatment provided mild relief.      Review of Systems  Constitutional: Positive for fatigue. Negative for chills, weight loss, weight gain and diaphoresis.  HENT: Negative.  Negative for hoarse voice.   Eyes: Negative.   Respiratory: Negative.  Negative for cough, choking, chest tightness, shortness of breath and stridor.   Cardiovascular: Negative.  Negative for chest pain, palpitations and leg swelling.  Gastrointestinal: Negative.  Negative for abdominal pain, diarrhea and constipation.  Endocrine: Negative.  Negative for cold intolerance and heat intolerance.  Genitourinary: Negative.  Negative for menstrual problem.  Musculoskeletal: Negative.  Negative for myalgias, back pain, joint swelling and arthralgias.  Skin: Negative.   Allergic/Immunologic: Negative.   Neurological: Negative.  Negative for dizziness and tremors.  Hematological: Negative.  Negative for adenopathy. Does not bruise/bleed easily.  Psychiatric/Behavioral: Negative.  The patient is not nervous/anxious.   All other systems reviewed and are negative.      Objective:   Physical Exam  Constitutional: She is oriented to person, place, and time. She appears well-developed and well-nourished. No distress.  HENT:  Head: Normocephalic and atraumatic.  Mouth/Throat: Oropharynx is clear and moist. No oropharyngeal exudate.  Eyes: Conjunctivae are normal. Right eye exhibits no discharge. Left eye exhibits no  discharge. No scleral icterus.  Neck: Normal range of motion. Neck supple. No JVD present. No tracheal deviation present. No thyromegaly present.  Cardiovascular: Normal rate, regular rhythm, normal heart sounds and intact distal pulses.  Exam reveals no gallop and no friction rub.   No murmur heard. Pulmonary/Chest: Effort normal and breath sounds normal. No stridor. No respiratory distress. She has no wheezes. She has no rales. She exhibits no tenderness.  Abdominal: Soft. Bowel sounds are normal. She exhibits no distension and no mass. There is no tenderness. There is no rebound and no guarding.  Musculoskeletal: Normal range of motion. She exhibits no edema or tenderness.  Lymphadenopathy:    She has no cervical adenopathy.  Neurological: She is oriented to person, place, and time.  Skin: Skin is warm and dry. No rash noted. She is not diaphoretic. No erythema. No pallor.  Psychiatric: She has a normal mood and affect. Her behavior is normal. Judgment and thought content normal.      Lab Results  Component Value Date   WBC 9.9 11/12/2014   HGB 13.9 11/12/2014   HCT 42.3 11/12/2014   PLT 243.0 11/12/2014   GLUCOSE 110* 11/12/2014   CHOL 188 11/12/2014   TRIG 179.0* 11/12/2014   HDL 37.00* 11/12/2014   LDLCALC 115* 11/12/2014   ALT 20 11/12/2014   AST 18 11/12/2014   NA 138 11/12/2014   K 4.0 11/12/2014   CL 103 11/12/2014   CREATININE 0.75 11/12/2014   BUN 8 11/12/2014   CO2 29 11/12/2014   TSH 17.39* 11/12/2014   HGBA1C 6.3 11/12/2014      Assessment & Plan:

## 2015-01-04 NOTE — Progress Notes (Signed)
Pre visit review using our clinic review tool, if applicable. No additional management support is needed unless otherwise documented below in the visit note. 

## 2015-01-04 NOTE — Patient Instructions (Signed)
Hypothyroidism The thyroid is a large gland located in the lower front of your neck. The thyroid gland helps control metabolism. Metabolism is how your body handles food. It controls metabolism with the hormone thyroxine. When this gland is underactive (hypothyroid), it produces too little hormone.  CAUSES These include:   Absence or destruction of thyroid tissue.  Goiter due to iodine deficiency.  Goiter due to medications.  Congenital defects (since birth).  Problems with the pituitary. This causes a lack of TSH (thyroid stimulating hormone). This hormone tells the thyroid to turn out more hormone. SYMPTOMS  Lethargy (feeling as though you have no energy)  Cold intolerance  Weight gain (in spite of normal food intake)  Dry skin  Coarse hair  Menstrual irregularity (if severe, may lead to infertility)  Slowing of thought processes Cardiac problems are also caused by insufficient amounts of thyroid hormone. Hypothyroidism in the newborn is cretinism, and is an extreme form. It is important that this form be treated adequately and immediately or it will lead rapidly to retarded physical and mental development. DIAGNOSIS  To prove hypothyroidism, your caregiver may do blood tests and ultrasound tests. Sometimes the signs are hidden. It may be necessary for your caregiver to watch this illness with blood tests either before or after diagnosis and treatment. TREATMENT  Low levels of thyroid hormone are increased by using synthetic thyroid hormone. This is a safe, effective treatment. It usually takes about four weeks to gain the full effects of the medication. After you have the full effect of the medication, it will generally take another four weeks for problems to leave. Your caregiver may start you on low doses. If you have had heart problems the dose may be gradually increased. It is generally not an emergency to get rapidly to normal. HOME CARE INSTRUCTIONS   Take your  medications as your caregiver suggests. Let your caregiver know of any medications you are taking or start taking. Your caregiver will help you with dosage schedules.  As your condition improves, your dosage needs may increase. It will be necessary to have continuing blood tests as suggested by your caregiver.  Report all suspected medication side effects to your caregiver. SEEK MEDICAL CARE IF: Seek medical care if you develop:  Sweating.  Tremulousness (tremors).  Anxiety.  Rapid weight loss.  Heat intolerance.  Emotional swings.  Diarrhea.  Weakness. SEEK IMMEDIATE MEDICAL CARE IF:  You develop chest pain, an irregular heart beat (palpitations), or a rapid heart beat. MAKE SURE YOU:   Understand these instructions.  Will watch your condition.  Will get help right away if you are not doing well or get worse. Document Released: 10/15/2005 Document Revised: 01/07/2012 Document Reviewed: 06/04/2008 [employer] Patient Information 2015 [employer]. This information is not intended to replace advice given to you by your health care provider. Make sure you discuss any questions you have with your health care provider.  

## 2015-01-05 ENCOUNTER — Encounter: Payer: Self-pay | Admitting: Internal Medicine

## 2015-01-05 NOTE — Assessment & Plan Note (Signed)
This is benign post-op changes

## 2015-01-05 NOTE — Assessment & Plan Note (Signed)
She has not been compliant with the thyroid therapy and therefore her Sarah Watts has been high She agrees to restart her thyroid medication

## 2015-01-05 NOTE — Assessment & Plan Note (Signed)
Her BP is well controlled 

## 2015-03-14 ENCOUNTER — Ambulatory Visit: Payer: BLUE CROSS/BLUE SHIELD | Admitting: Internal Medicine

## 2015-03-17 ENCOUNTER — Encounter: Payer: Self-pay | Admitting: Internal Medicine

## 2015-03-17 ENCOUNTER — Other Ambulatory Visit (INDEPENDENT_AMBULATORY_CARE_PROVIDER_SITE_OTHER): Payer: BLUE CROSS/BLUE SHIELD

## 2015-03-17 ENCOUNTER — Ambulatory Visit (INDEPENDENT_AMBULATORY_CARE_PROVIDER_SITE_OTHER): Payer: BLUE CROSS/BLUE SHIELD | Admitting: Internal Medicine

## 2015-03-17 VITALS — BP 140/74 | HR 86 | Temp 98.2°F | Resp 16 | Ht 60.0 in | Wt 185.0 lb

## 2015-03-17 DIAGNOSIS — E038 Other specified hypothyroidism: Secondary | ICD-10-CM | POA: Diagnosis not present

## 2015-03-17 DIAGNOSIS — I10 Essential (primary) hypertension: Secondary | ICD-10-CM | POA: Diagnosis not present

## 2015-03-17 DIAGNOSIS — Z1231 Encounter for screening mammogram for malignant neoplasm of breast: Secondary | ICD-10-CM

## 2015-03-17 LAB — TSH: TSH: 1.48 u[IU]/mL (ref 0.35–4.50)

## 2015-03-17 LAB — BASIC METABOLIC PANEL
BUN: 12 mg/dL (ref 6–23)
CO2: 27 mEq/L (ref 19–32)
Calcium: 10 mg/dL (ref 8.4–10.5)
Chloride: 103 mEq/L (ref 96–112)
Creatinine, Ser: 0.77 mg/dL (ref 0.40–1.20)
GFR: 84.32 mL/min (ref >60.00)
GLUCOSE: 151 mg/dL — AB (ref 70–99)
POTASSIUM: 3.5 meq/L (ref 3.5–5.1)
Sodium: 138 mEq/L (ref 135–145)

## 2015-03-17 NOTE — Patient Instructions (Signed)
Hypothyroidism The thyroid is a large gland located in the lower front of your neck. The thyroid gland helps control metabolism. Metabolism is how your body handles food. It controls metabolism with the hormone thyroxine. When this gland is underactive (hypothyroid), it produces too little hormone.  CAUSES These include:   Absence or destruction of thyroid tissue.  Goiter due to iodine deficiency.  Goiter due to medications.  Congenital defects (since birth).  Problems with the pituitary. This causes a lack of TSH (thyroid stimulating hormone). This hormone tells the thyroid to turn out more hormone. SYMPTOMS  Lethargy (feeling as though you have no energy)  Cold intolerance  Weight gain (in spite of normal food intake)  Dry skin  Coarse hair  Menstrual irregularity (if severe, may lead to infertility)  Slowing of thought processes Cardiac problems are also caused by insufficient amounts of thyroid hormone. Hypothyroidism in the newborn is cretinism, and is an extreme form. It is important that this form be treated adequately and immediately or it will lead rapidly to retarded physical and mental development. DIAGNOSIS  To prove hypothyroidism, your caregiver may do blood tests and ultrasound tests. Sometimes the signs are hidden. It may be necessary for your caregiver to watch this illness with blood tests either before or after diagnosis and treatment. TREATMENT  Low levels of thyroid hormone are increased by using synthetic thyroid hormone. This is a safe, effective treatment. It usually takes about four weeks to gain the full effects of the medication. After you have the full effect of the medication, it will generally take another four weeks for problems to leave. Your caregiver may start you on low doses. If you have had heart problems the dose may be gradually increased. It is generally not an emergency to get rapidly to normal. HOME CARE INSTRUCTIONS   Take your  medications as your caregiver suggests. Let your caregiver know of any medications you are taking or start taking. Your caregiver will help you with dosage schedules.  As your condition improves, your dosage needs may increase. It will be necessary to have continuing blood tests as suggested by your caregiver.  Report all suspected medication side effects to your caregiver. SEEK MEDICAL CARE IF: Seek medical care if you develop:  Sweating.  Tremulousness (tremors).  Anxiety.  Rapid weight loss.  Heat intolerance.  Emotional swings.  Diarrhea.  Weakness. SEEK IMMEDIATE MEDICAL CARE IF:  You develop chest pain, an irregular heart beat (palpitations), or a rapid heart beat. MAKE SURE YOU:   Understand these instructions.  Will watch your condition.  Will get help right away if you are not doing well or get worse. Document Released: 10/15/2005 Document Revised: 01/07/2012 Document Reviewed: 06/04/2008 [employer] Patient Information 2015 [employer]. This information is not intended to replace advice given to you by your health care provider. Make sure you discuss any questions you have with your health care provider.  

## 2015-03-17 NOTE — Progress Notes (Signed)
Pre visit review using our clinic review tool, if applicable. No additional management support is needed unless otherwise documented below in the visit note. 

## 2015-03-20 MED ORDER — LEVOTHYROXINE SODIUM 75 MCG PO TABS
75.0000 ug | ORAL_TABLET | Freq: Every day | ORAL | Status: DC
Start: 2015-03-20 — End: 2015-12-22

## 2015-03-20 NOTE — Progress Notes (Signed)
Subjective:  Patient ID: Sarah Watts, female    DOB: 11-May-1966  Age: 49 y.o. MRN: [id number]  CC: Hypothyroidism   HPI Sarah Watts presents for follow up on hypothyroidism - she feels well and offers no complaints.  Outpatient Prescriptions Prior to Visit  Medication Sig Dispense Refill  . esomeprazole (NEXIUM) 40 MG capsule Take 40 mg by mouth daily before breakfast.    . fluticasone (FLONASE) 50 MCG/ACT nasal spray USE TWO SPRAY(S) IN EACH NOSTRIL ONCE DAILY 16 g 11  . levothyroxine (SYNTHROID, LEVOTHROID) 75 MCG tablet Take 1 tablet (75 mcg total) by mouth daily. 90 tablet 1  . loratadine (CLARITIN) 10 MG tablet Take 10 mg by mouth daily.      . Olmesartan-Amlodipine-HCTZ 20-5-12.5 MG TABS Take 1 tablet by mouth daily. 30 tablet 5  . sodium chloride (OCEAN) 0.65 % nasal spray Place 2 sprays into the nose every 4 (four) hours as needed for congestion.     No facility-administered medications prior to visit.    ROS Review of Systems  Constitutional: Negative.  Negative for fever, chills, diaphoresis, appetite change and fatigue.  HENT: Negative.   Eyes: Negative.   Respiratory: Negative.  Negative for cough, choking, chest tightness, shortness of breath and stridor.   Cardiovascular: Negative.  Negative for chest pain, palpitations and leg swelling.  Gastrointestinal: Negative.  Negative for nausea, vomiting, abdominal pain, diarrhea, constipation and blood in stool.  Endocrine: Negative.   Genitourinary: Negative.   Musculoskeletal: Negative.  Negative for myalgias, back pain, joint swelling and arthralgias.  Skin: Negative.  Negative for rash.  Allergic/Immunologic: Negative.   Neurological: Negative.  Negative for dizziness, syncope, speech difficulty, light-headedness and headaches.  Hematological: Negative.  Negative for adenopathy. Does not bruise/bleed easily.  Psychiatric/Behavioral: Negative.     Objective:  BP 140/74 mmHg  Pulse 86  Temp(Src) 98.2 F  (36.8 C) (Oral)  Resp 16  Ht 5' (1.524 m)  Wt 185 lb (83.915 kg)  BMI 36.13 kg/m2  SpO2 97%  BP Readings from Last 3 Encounters:  03/17/15 140/74  01/04/15 148/88  11/12/14 156/96    Wt Readings from Last 3 Encounters:  03/17/15 185 lb (83.915 kg)  01/04/15 188 lb (85.276 kg)  11/12/14 188 lb (85.276 kg)    Physical Exam  Constitutional: She is oriented to person, place, and time. She appears well-developed and well-nourished. No distress.  HENT:  Head: Normocephalic and atraumatic.  Mouth/Throat: Oropharynx is clear and moist. No oropharyngeal exudate.  Eyes: Conjunctivae are normal. Right eye exhibits no discharge. Left eye exhibits no discharge. No scleral icterus.  Neck: Normal range of motion. Neck supple. No JVD present. No tracheal deviation present. No thyromegaly present.  Cardiovascular: Normal rate, regular rhythm, normal heart sounds and intact distal pulses.  Exam reveals no gallop and no friction rub.   No murmur heard. Pulmonary/Chest: Effort normal and breath sounds normal. No stridor. No respiratory distress. She has no wheezes. She has no rales. She exhibits no tenderness.  Abdominal: Soft. Bowel sounds are normal. She exhibits no distension and no mass. There is no tenderness. There is no rebound and no guarding.  Musculoskeletal: Normal range of motion. She exhibits no edema or tenderness.  Lymphadenopathy:    She has no cervical adenopathy.  Neurological: She is oriented to person, place, and time.  Skin: Skin is warm and dry. No rash noted. She is not diaphoretic. No erythema. No pallor.  Psychiatric: She has a normal mood and affect.  Her behavior is normal. Judgment and thought content normal.  Vitals reviewed.   Lab Results  Component Value Date   WBC 9.9 11/12/2014   HGB 13.9 11/12/2014   HCT 42.3 11/12/2014   PLT 243.0 11/12/2014   GLUCOSE 151* 03/17/2015   CHOL 188 11/12/2014   TRIG 179.0* 11/12/2014   HDL 37.00* 11/12/2014   LDLCALC 115*  11/12/2014   ALT 20 11/12/2014   AST 18 11/12/2014   NA 138 03/17/2015   K 3.5 03/17/2015   CL 103 03/17/2015   CREATININE 0.77 03/17/2015   BUN 12 03/17/2015   CO2 27 03/17/2015   TSH 1.48 03/17/2015   HGBA1C 6.3 11/12/2014    Ct Soft Tissue Neck W Contrast  12/28/2014   CLINICAL DATA:  LEFT neck mass, history of LEFT side salivary gland removal 10 years ago  EXAM: CT NECK WITH CONTRAST  TECHNIQUE: Multidetector CT imaging of the neck was performed using the standard protocol following the bolus administration of intravenous contrast. Sagittal and coronal MPR images reconstructed from axial data set. BB placed at site of palpable concern at the lateral LEFT neck.  CONTRAST:  74mL OMNIPAQUE IOHEXOL 300 MG/ML  SOLN  COMPARISON:  None  FINDINGS: Visualized intracranial structures unremarkable.  Vascular structures patent.  Beam hardening artifacts from earrings and from dental amalgam.  Symmetric appearing thyroid, submandibular, and parotid glands.  Focus of skin thickening and minimal subcutaneous thickening identified immediately above the BB placed at lateral LEFT neck, located at the posterior margin of the LEFT parotid gland.  Superficial border of the LEFT parotid gland also demonstrates soft tissue thickening.  These changes likely reflect postsurgical scarring.  No discrete soft tissue mass, adenopathy or fluid collection identified at this site of palpable concern.  Scattered normal sized cervical lymph nodes bilaterally.  Unremarkable pharynx and parapharyngeal spaces.  Lung apices clear.  Mild degenerative disc disease changes of the cervical spine.  Skullbase unremarkable with visualized paranasal sinuses and mastoid air cells clear.  IMPRESSION: Skin and subcutaneous thickening at the lateral LEFT neck at the site of previous surgery and corresponding to the area of palpable concern.  No underlying mass, adenopathy or fluid collection identified.  Mild degenerative disc disease changes  cervical spine.  Remainder of exam unremarkable.   Electronically Signed   By: Lavonia Dana M.D.   On: 12/28/2014 10:38    Assessment & Plan:   Sarah Watts was seen today for hypothyroidism.  Diagnoses and all orders for this visit:  Visit for screening mammogram Orders: -     MM DIGITAL SCREENING BILATERAL; Future  Essential hypertension her BP is well controlled, lytes and renal function are stable Orders: -     Basic metabolic panel; Future  Other specified hypothyroidism her TSh is in the normal range, will remain on the current dose Orders: -     TSH; Future   I am having Sarah Watts maintain her loratadine, esomeprazole, sodium chloride, fluticasone, Olmesartan-Amlodipine-HCTZ, and levothyroxine.  No orders of the defined types were placed in this encounter.     Follow-up: Return in about 4 months (around 07/18/2015).  Scarlette Calico, MD

## 2015-09-03 ENCOUNTER — Other Ambulatory Visit: Payer: Self-pay | Admitting: Internal Medicine

## 2015-11-07 ENCOUNTER — Emergency Department (HOSPITAL_COMMUNITY): Payer: BLUE CROSS/BLUE SHIELD

## 2015-11-07 ENCOUNTER — Emergency Department (HOSPITAL_COMMUNITY)
Admission: EM | Admit: 2015-11-07 | Discharge: 2015-11-07 | Disposition: A | Discharge status: Home / Self Care | Payer: BLUE CROSS/BLUE SHIELD | Attending: Emergency Medicine | Admitting: Emergency Medicine

## 2015-11-07 ENCOUNTER — Encounter (HOSPITAL_COMMUNITY): Payer: Self-pay

## 2015-11-07 DIAGNOSIS — R059 Cough, unspecified: Secondary | ICD-10-CM

## 2015-11-07 DIAGNOSIS — I10 Essential (primary) hypertension: Secondary | ICD-10-CM | POA: Diagnosis not present

## 2015-11-07 DIAGNOSIS — Z87891 Personal history of nicotine dependence: Secondary | ICD-10-CM | POA: Diagnosis not present

## 2015-11-07 DIAGNOSIS — R011 Cardiac murmur, unspecified: Secondary | ICD-10-CM | POA: Diagnosis not present

## 2015-11-07 DIAGNOSIS — J40 Bronchitis, not specified as acute or chronic: Secondary | ICD-10-CM

## 2015-11-07 DIAGNOSIS — Z7951 Long term (current) use of inhaled steroids: Secondary | ICD-10-CM | POA: Insufficient documentation

## 2015-11-07 DIAGNOSIS — J209 Acute bronchitis, unspecified: Secondary | ICD-10-CM | POA: Diagnosis not present

## 2015-11-07 DIAGNOSIS — R05 Cough: Secondary | ICD-10-CM

## 2015-11-07 DIAGNOSIS — Z79899 Other long term (current) drug therapy: Secondary | ICD-10-CM | POA: Insufficient documentation

## 2015-11-07 MED ORDER — AZITHROMYCIN 250 MG PO TABS: ORAL_TABLET | ORAL | Status: DC

## 2015-11-07 NOTE — ED Provider Notes (Signed)
CSN: [id number]     Arrival date & time 11/07/15  [id number] History   First MD Initiated Contact with Patient 11/07/15 0740     Chief Complaint  Patient presents with  . Chest Congestion   . Cough     (Consider location/radiation/quality/duration/timing/severity/associated sxs/prior Treatment) Patient is a 50 y.o. female presenting with cough. The history is provided by the patient.  Cough Associated symptoms: fever and sore throat   Associated symptoms: no chest pain, no chills, no eye discharge, no headaches, no rash and no shortness of breath   Patient c/o productive cough, occ whitish sputum.  Symptoms present x 3 days. Cough episiodic no sob. Scratchy throat 'from coughing so much'.  No rhinorrhea. Subjective fever. No headache. No chest pain.  No leg pain or swelling. Family member w recent pna.    Past Medical History  Diagnosis Date  . Allergic rhinitis   . HTN (hypertension)   . Heart murmur   . Irregular heartbeat    Past Surgical History  Procedure Laterality Date  . Appendectomy    . Cholecystectomy    . Tonsillectomy    . Cesarean section    . Facial reconstruction surgery      Left, due to birth defect   Family History  Problem Relation Age of Onset  . Diabetes Other     1st degree relative  . Hypertension Other   . Prostate cancer Other     1st degree relative <50  . Allergies Mother   . Breast cancer Mother   . Allergies Father   . Allergies Sister   . Asthma Sister   . Colon cancer Neg Hx   . Heart disease Father    Social History  Substance Use Topics  . Smoking status: Former Smoker -- 0.50 packs/day for 7 years    Types: Cigarettes    Quit date: 06/29/2013  . Smokeless tobacco: Never Used  . Alcohol Use: No   OB History    No data available     Review of Systems  Constitutional: Positive for fever. Negative for chills.  HENT: Positive for sore throat.   Eyes: Negative for discharge and redness.  Respiratory: Positive for cough. Negative  for shortness of breath.   Cardiovascular: Negative for chest pain.  Gastrointestinal: Negative for abdominal pain.  Genitourinary: Negative for flank pain.  Musculoskeletal: Negative for back pain and neck pain.  Skin: Negative for rash.  Neurological: Negative for headaches.  Hematological: Does not bruise/bleed easily.  Psychiatric/Behavioral: Negative for confusion.      Allergies  Codeine and Shellfish allergy  Home Medications   Prior to Admission medications   Medication Sig Start Date End Date Taking? Authorizing Provider  esomeprazole (NEXIUM) 40 MG capsule Take 40 mg by mouth daily before breakfast.    Historical Provider, MD  fluticasone (FLONASE) 50 MCG/ACT nasal spray USE TWO SPRAY(S) IN EACH NOSTRIL ONCE DAILY 09/04/15   Janith Lima, MD  levothyroxine (SYNTHROID, LEVOTHROID) 75 MCG tablet Take 1 tablet (75 mcg total) by mouth daily. 03/20/15   Janith Lima, MD  loratadine (CLARITIN) 10 MG tablet Take 10 mg by mouth daily.      Historical Provider, MD  Olmesartan-Amlodipine-HCTZ 20-5-12.5 MG TABS Take 1 tablet by mouth daily. 11/12/14   Janith Lima, MD  sodium chloride (OCEAN) 0.65 % nasal spray Place 2 sprays into the nose every 4 (four) hours as needed for congestion.    Historical Provider, MD   BP 155/88  mmHg  Pulse 73  Temp(Src) 98.3 F (36.8 C) (Oral)  Resp 16  SpO2 100% Physical Exam  Constitutional: She appears well-developed and well-nourished. No distress.  Eyes: Conjunctivae are normal. No scleral icterus.  Neck: Neck supple. No tracheal deviation present.  Cardiovascular: Normal rate.   Pulmonary/Chest: Effort normal. No respiratory distress.  L few rhonchi.  Abdominal: Normal appearance. She exhibits no distension.  Musculoskeletal: She exhibits no edema.  Neurological: She is alert.  Skin: Skin is warm and dry. No rash noted.  Psychiatric: She has a normal mood and affect.  Nursing note and vitals reviewed.   ED Course  Procedures  (including critical care time)     I have personally reviewed and evaluated these images as part of my medical decision-making.   MDM   Cxr.  Reviewed nursing notes and prior charts for additional history.   No definite pna on cxr.   Pt sounds congested. Pt notes smoking hx, hx recurrent bronchitis.  Confirmed no abx allergies w pt.  rx provided.  Pt appears stable for d/c.       Lajean Saver, MD 11/07/15 [phone]

## 2015-11-07 NOTE — ED Notes (Addendum)
Pt c/o chest congestion and productive cough x 3 days.  Pt reports white mucus.  Sts she has been taking Mucinex w/o relief.  Congested cough noted.  Pt sts "I get bronchitis a lot."

## 2015-11-07 NOTE — Discharge Instructions (Signed)
It was our pleasure to provide your ER care today - we hope that you feel better.  Rest. Drink plenty of fluids.  Take antibiotic as prescribed.  Take robitussin, mucinex, or nyquil as need for symptom relief.  Follow up with primary care doctor in 1 week if symptoms fail to improve/resolve.  Return to ER if worse, difficulty breathing, other concern.       Cough, Adult Coughing is a reflex that clears your throat and your airways. Coughing helps to heal and protect your lungs. It is normal to cough occasionally, but a cough that happens with other symptoms or lasts a long time may be a sign of a condition that needs treatment. A cough may last only 2-3 weeks (acute), or it may last longer than 8 weeks (chronic). CAUSES Coughing is commonly caused by:  Breathing in substances that irritate your lungs.  A viral or bacterial respiratory infection.  Allergies.  Asthma.  Postnasal drip.  Smoking.  Acid backing up from the stomach into the esophagus (gastroesophageal reflux).  Certain medicines.  Chronic lung problems, including COPD (or rarely, lung cancer).  Other medical conditions such as heart failure. HOME CARE INSTRUCTIONS  Pay attention to any changes in your symptoms. Take these actions to help with your discomfort:  Take medicines only as told by your health care provider.  If you were prescribed an antibiotic medicine, take it as told by your health care provider. Do not stop taking the antibiotic even if you start to feel better.  Talk with your health care provider before you take a cough suppressant medicine.  Drink enough fluid to keep your urine clear or pale yellow.  If the air is dry, use a cold steam vaporizer or humidifier in your bedroom or your home to help loosen secretions.  Avoid anything that causes you to cough at work or at home.  If your cough is worse at night, try sleeping in a semi-upright position.  Avoid cigarette smoke. If you  smoke, quit smoking. If you need help quitting, ask your health care provider.  Avoid caffeine.  Avoid alcohol.  Rest as needed. SEEK MEDICAL CARE IF:   You have new symptoms.  You cough up pus.  Your cough does not get better after 2-3 weeks, or your cough gets worse.  You cannot control your cough with suppressant medicines and you are losing sleep.  You develop pain that is getting worse or pain that is not controlled with pain medicines.  You have a fever.  You have unexplained weight loss.  You have night sweats. SEEK IMMEDIATE MEDICAL CARE IF:  You cough up blood.  You have difficulty breathing.  Your heartbeat is very fast.   This information is not intended to replace advice given to you by your health care provider. Make sure you discuss any questions you have with your health care provider.   Document Released: 04/13/2011 Document Revised: 07/06/2015 Document Reviewed: 12/22/2014 Elsevier Interactive Patient Education 2016 Elsevier Inc[employer]     Acute Bronchitis Bronchitis is when the airways that extend from the windpipe into the lungs get red, puffy, and painful (inflamed). Bronchitis often causes thick spit (mucus) to develop. This leads to a cough. A cough is the most common symptom of bronchitis. In acute bronchitis, the condition usually begins suddenly and goes away over time (usually in 2 weeks). Smoking, allergies, and asthma can make bronchitis worse. Repeated episodes of bronchitis may cause more lung problems. HOME CARE  Rest.  Drink enough fluids to keep your pee (urine) clear or pale yellow (unless you need to limit fluids as told by your doctor).  Only take over-the-counter or prescription medicines as told by your doctor.  Avoid smoking and secondhand smoke. These can make bronchitis worse. If you are a smoker, think about using nicotine gum or skin patches. Quitting smoking will help your lungs heal faster.  Reduce the chance of getting  bronchitis again by:  Washing your hands often.  Avoiding people with cold symptoms.  Trying not to touch your hands to your mouth, nose, or eyes.  Follow up with your doctor as told. GET HELP IF: Your symptoms do not improve after 1 week of treatment. Symptoms include:  Cough.  Fever.  Coughing up thick spit.  Body aches.  Chest congestion.  Chills.  Shortness of breath.  Sore throat. GET HELP RIGHT AWAY IF:   You have an increased fever.  You have chills.  You have severe shortness of breath.  You have bloody thick spit (sputum).  You throw up (vomit) often.  You lose too much body fluid (dehydration).  You have a severe headache.  You faint. MAKE SURE YOU:   Understand these instructions.  Will watch your condition.  Will get help right away if you are not doing well or get worse.   This information is not intended to replace advice given to you by your health care provider. Make sure you discuss any questions you have with your health care provider.   Document Released: 04/02/2008 Document Revised: 06/17/2013 Document Reviewed: 04/07/2013 Elsevier Interactive Patient Education [employer].

## 2015-11-15 ENCOUNTER — Other Ambulatory Visit: Payer: Self-pay | Admitting: Internal Medicine

## 2015-12-01 ENCOUNTER — Ambulatory Visit (INDEPENDENT_AMBULATORY_CARE_PROVIDER_SITE_OTHER): Payer: BLUE CROSS/BLUE SHIELD | Admitting: Internal Medicine

## 2015-12-01 ENCOUNTER — Encounter: Payer: Self-pay | Admitting: Internal Medicine

## 2015-12-01 ENCOUNTER — Other Ambulatory Visit (INDEPENDENT_AMBULATORY_CARE_PROVIDER_SITE_OTHER): Payer: BLUE CROSS/BLUE SHIELD

## 2015-12-01 VITALS — BP 146/82 | HR 75 | Temp 97.6°F | Resp 16 | Ht 60.0 in | Wt 168.0 lb

## 2015-12-01 DIAGNOSIS — I10 Essential (primary) hypertension: Secondary | ICD-10-CM

## 2015-12-01 DIAGNOSIS — Z1231 Encounter for screening mammogram for malignant neoplasm of breast: Secondary | ICD-10-CM

## 2015-12-01 DIAGNOSIS — J069 Acute upper respiratory infection, unspecified: Secondary | ICD-10-CM | POA: Insufficient documentation

## 2015-12-01 DIAGNOSIS — E038 Other specified hypothyroidism: Secondary | ICD-10-CM

## 2015-12-01 DIAGNOSIS — R739 Hyperglycemia, unspecified: Secondary | ICD-10-CM

## 2015-12-01 DIAGNOSIS — E876 Hypokalemia: Secondary | ICD-10-CM | POA: Diagnosis not present

## 2015-12-01 DIAGNOSIS — B9789 Other viral agents as the cause of diseases classified elsewhere: Secondary | ICD-10-CM

## 2015-12-01 DIAGNOSIS — Z720 Tobacco use: Secondary | ICD-10-CM | POA: Insufficient documentation

## 2015-12-01 LAB — CBC WITH DIFFERENTIAL/PLATELET
BASOS PCT: 0.5 % (ref 0.0–3.0)
Basophils Absolute: 0.1 10*3/uL (ref 0.0–0.1)
EOS PCT: 2.4 % (ref 0.0–5.0)
Eosinophils Absolute: 0.3 10*3/uL (ref 0.0–0.7)
HEMATOCRIT: 44.2 % (ref 36.0–46.0)
Hemoglobin: 14.6 g/dL (ref 12.0–15.0)
LYMPHS PCT: 24.6 % (ref 12.0–46.0)
Lymphs Abs: 2.9 10*3/uL (ref 0.7–4.0)
MCHC: 33 g/dL (ref 30.0–36.0)
MCV: 87.5 fl (ref 78.0–100.0)
MONO ABS: 0.7 10*3/uL (ref 0.1–1.0)
Monocytes Relative: 6.2 % (ref 3.0–12.0)
Neutro Abs: 7.8 10*3/uL — ABNORMAL HIGH (ref 1.4–7.7)
Neutrophils Relative %: 66.3 % (ref 43.0–77.0)
Platelets: 394 10*3/uL (ref 150.0–400.0)
RBC: 5.05 Mil/uL (ref 3.87–5.11)
RDW: 13.8 % (ref 11.5–15.5)
WBC: 11.8 10*3/uL — ABNORMAL HIGH (ref 4.0–10.5)

## 2015-12-01 LAB — BASIC METABOLIC PANEL
BUN: 9 mg/dL (ref 6–23)
CHLORIDE: 103 meq/L (ref 96–112)
CO2: 30 mEq/L (ref 19–32)
Calcium: 9.8 mg/dL (ref 8.4–10.5)
Creatinine, Ser: 0.78 mg/dL (ref 0.40–1.20)
GFR: 82.83 mL/min (ref >60.00)
Glucose, Bld: 110 mg/dL — ABNORMAL HIGH (ref 70–99)
POTASSIUM: 4.2 meq/L (ref 3.5–5.1)
SODIUM: 141 meq/L (ref 135–145)

## 2015-12-01 LAB — TSH: TSH: 0.74 u[IU]/mL (ref 0.35–4.50)

## 2015-12-01 LAB — MAGNESIUM: MAGNESIUM: 2.4 mg/dL (ref 1.5–2.5)

## 2015-12-01 LAB — HEMOGLOBIN A1C: HEMOGLOBIN A1C: 5.8 % (ref 4.6–6.5)

## 2015-12-01 MED ORDER — HYDROCOD POLST-CPM POLST ER 10-8 MG/5ML PO SUER: 5.0000 mL | Freq: Two times a day (BID) | ORAL | Status: DC | PRN

## 2015-12-01 MED ORDER — VALSARTAN 160 MG PO TABS: 160.0000 mg | ORAL_TABLET | Freq: Every day | ORAL | Status: DC

## 2015-12-01 MED ORDER — TRIAMTERENE-HCTZ 37.5-25 MG PO CAPS: 1.0000 | ORAL_CAPSULE | Freq: Every day | ORAL | Status: DC

## 2015-12-01 NOTE — Patient Instructions (Signed)
Hypertension Hypertension, commonly called high blood pressure, is when the force of blood pumping through your arteries is too strong. Your arteries are the blood vessels that carry blood from your heart throughout your body. A blood pressure reading consists of a higher number over a lower number, such as 110/72. The higher number (systolic) is the pressure inside your arteries when your heart pumps. The lower number (diastolic) is the pressure inside your arteries when your heart relaxes. Ideally you want your blood pressure below 120/80. Hypertension forces your heart to work harder to pump blood. Your arteries may become narrow or stiff. Having untreated or uncontrolled hypertension can cause heart attack, stroke, kidney disease, and other problems. RISK FACTORS Some risk factors for high blood pressure are controllable. Others are not.  Risk factors you cannot control include:   Race. You may be at higher risk if you are African American.  Age. Risk increases with age.  Gender. Men are at higher risk than women before age 45 years. After age 65, women are at higher risk than men. Risk factors you can control include:  Not getting enough exercise or physical activity.  Being overweight.  Getting too much fat, sugar, calories, or salt in your diet.  Drinking too much alcohol. SIGNS AND SYMPTOMS Hypertension does not usually cause signs or symptoms. Extremely high blood pressure (hypertensive crisis) may cause headache, anxiety, shortness of breath, and nosebleed. DIAGNOSIS To check if you have hypertension, your health care provider will measure your blood pressure while you are seated, with your arm held at the level of your heart. It should be measured at least twice using the same arm. Certain conditions can cause a difference in blood pressure between your right and left arms. A blood pressure reading that is higher than normal on one occasion does not mean that you need treatment. If  it is not clear whether you have high blood pressure, you may be asked to return on a different day to have your blood pressure checked again. Or, you may be asked to monitor your blood pressure at home for 1 or more weeks. TREATMENT Treating high blood pressure includes making lifestyle changes and possibly taking medicine. Living a healthy lifestyle can help lower high blood pressure. You may need to change some of your habits. Lifestyle changes may include:  Following the DASH diet. This diet is high in fruits, vegetables, and whole grains. It is low in salt, red meat, and added sugars.  Keep your sodium intake below 2,300 mg per day.  Getting at least 30-45 minutes of aerobic exercise at least 4 times per week.  Losing weight if necessary.  Not smoking.  Limiting alcoholic beverages.  Learning ways to reduce stress. Your health care provider may prescribe medicine if lifestyle changes are not enough to get your blood pressure under control, and if one of the following is true:  You are 18-59 years of age and your systolic blood pressure is above 140.  You are 60 years of age or older, and your systolic blood pressure is above 150.  Your diastolic blood pressure is above 90.  You have diabetes, and your systolic blood pressure is over 140 or your diastolic blood pressure is over 90.  You have kidney disease and your blood pressure is above 140/90.  You have heart disease and your blood pressure is above 140/90. Your personal target blood pressure may vary depending on your medical conditions, your age, and other factors. HOME CARE INSTRUCTIONS    Have your blood pressure rechecked as directed by your health care provider.   Take medicines only as directed by your health care provider. Follow the directions carefully. Blood pressure medicines must be taken as prescribed. The medicine does not work as well when you skip doses. Skipping doses also puts you at risk for  problems.  Do not smoke.   Monitor your blood pressure at home as directed by your health care provider. SEEK MEDICAL CARE IF:   You think you are having a reaction to medicines taken.  You have recurrent headaches or feel dizzy.  You have swelling in your ankles.  You have trouble with your vision. SEEK IMMEDIATE MEDICAL CARE IF:  You develop a severe headache or confusion.  You have unusual weakness, numbness, or feel faint.  You have severe chest or abdominal pain.  You vomit repeatedly.  You have trouble breathing. MAKE SURE YOU:   Understand these instructions.  Will watch your condition.  Will get help right away if you are not doing well or get worse.   This information is not intended to replace advice given to you by your health care provider. Make sure you discuss any questions you have with your health care provider.   Document Released: 10/15/2005 Document Revised: 03/01/2015 Document Reviewed: 08/07/2013 Elsevier Interactive Patient Education 2016 Elsevier Inc.  

## 2015-12-01 NOTE — Progress Notes (Signed)
Subjective:  Patient ID: Sarah Watts, female    DOB: April 08, 1966  Age: 50 y.o. MRN: [id number]  CC: Hypertension and Cough   HPI Sarah Watts presents for follow-up after recent admission to [facility]. She tells me a few days ago she was at home and she developed chest pain and shortness of breath. She was seen in emergency room and she was told that her EKG was abnormal due to a very low potassium level. She tells me that her chest x-ray was normal. She was admitted and given potassium and tells me that she is improving. She also had a cough at the time which has persisted. The cough is productive of a white, foamy phlegm. For 2 days now she has had no chest pain or shortness of breath.  Outpatient Prescriptions Prior to Visit  Medication Sig Dispense Refill  . fluticasone (FLONASE) 50 MCG/ACT nasal spray USE TWO SPRAY(S) IN EACH NOSTRIL ONCE DAILY 16 g 11  . guaifenesin (ROBITUSSIN) 100 MG/5ML syrup Take 200 mg by mouth every 4 (four) hours as needed for cough.    . levothyroxine (SYNTHROID, LEVOTHROID) 75 MCG tablet Take 1 tablet (75 mcg total) by mouth daily. 90 tablet 1  . loratadine (CLARITIN) 10 MG tablet Take 10 mg by mouth daily.      . diphenhydramine-acetaminophen (TYLENOL PM) 25-500 MG TABS tablet Take 2 tablets by mouth at bedtime as needed (for pain/sleep).    . Olmesartan-Amlodipine-HCTZ 40-5-12.5 MG TABS TAKE ONE TABLET BY MOUTH ONCE DAILY 90 tablet 1  . TRIBENZOR 40-5-12.5 MG TABS Take 1 tablet by mouth every evening.     Marland Kitchen azithromycin (ZITHROMAX Z-PAK) 250 MG tablet Take as directed 6 tablet 0  . Phenylephrine-DM-GG (MUCINEX FAST-MAX CONGEST COUGH) 2.5-5-100 MG/5ML LIQD Take 30 mLs by mouth every 4 (four) hours as needed (for cold).     No facility-administered medications prior to visit.    ROS Review of Systems  Constitutional: Negative.  Negative for fever, chills, diaphoresis, appetite change and fatigue.  HENT: Negative.   Eyes:  Negative.   Respiratory: Positive for cough. Negative for apnea, choking, chest tightness, shortness of breath, wheezing and stridor.   Cardiovascular: Negative.  Negative for chest pain, palpitations and leg swelling.  Gastrointestinal: Negative.  Negative for nausea, vomiting, abdominal pain, diarrhea and constipation.  Endocrine: Negative.   Genitourinary: Negative.   Musculoskeletal: Negative.  Negative for myalgias, back pain, joint swelling and arthralgias.  Skin: Negative.  Negative for color change and rash.  Allergic/Immunologic: Negative.   Neurological: Negative.   Hematological: Negative.  Negative for adenopathy. Does not bruise/bleed easily.  Psychiatric/Behavioral: Negative.     Objective:  BP 146/82 mmHg  Pulse 75  Temp(Src) 97.6 F (36.4 C) (Oral)  Resp 16  Ht 5' (1.524 m)  Wt 168 lb (76.204 kg)  BMI 32.81 kg/m2  SpO2 98%  BP Readings from Last 3 Encounters:  12/01/15 146/82  11/07/15 125/76  03/17/15 140/74    Wt Readings from Last 3 Encounters:  12/01/15 168 lb (76.204 kg)  03/17/15 185 lb (83.915 kg)  01/04/15 188 lb (85.276 kg)    Physical Exam  Constitutional: She is oriented to person, place, and time. She appears well-developed and well-nourished. No distress.  HENT:  Head: Normocephalic and atraumatic.  Mouth/Throat: Oropharynx is clear and moist. No oropharyngeal exudate.  Eyes: Conjunctivae are normal. Right eye exhibits no discharge. Left eye exhibits no discharge. No scleral icterus.  Neck: Normal range  of motion. Neck supple. No JVD present. No tracheal deviation present. No thyromegaly present.  Cardiovascular: Normal rate, regular rhythm, S1 normal, normal heart sounds and intact distal pulses.  Exam reveals no gallop and no friction rub.   No murmur heard. Pulses:      Carotid pulses are 1+ on the right side, and 1+ on the left side.      Radial pulses are 1+ on the right side, and 1+ on the left side.       Femoral pulses are 1+ on  the right side, and 1+ on the left side.      Popliteal pulses are 1+ on the right side, and 1+ on the left side.       Dorsalis pedis pulses are 1+ on the right side, and 1+ on the left side.       Posterior tibial pulses are 1+ on the right side, and 1+ on the left side.  EKG-  Sinus  Rhythm  -Right bundle branch block with left axis -bifascicular block.   ABNORMAL   Pulmonary/Chest: Effort normal and breath sounds normal. No stridor. No respiratory distress. She has no wheezes. She has no rales. She exhibits no tenderness.  Abdominal: Soft. Bowel sounds are normal. She exhibits no distension and no mass. There is no tenderness. There is no rebound and no guarding.  Musculoskeletal: Normal range of motion. She exhibits no edema or tenderness.  Lymphadenopathy:    She has no cervical adenopathy.  Neurological: She is oriented to person, place, and time.  Skin: Skin is warm and dry. No rash noted. She is not diaphoretic. No erythema. No pallor.  Vitals reviewed.   Lab Results  Component Value Date   WBC 11.8* 12/01/2015   HGB 14.6 12/01/2015   HCT 44.2 12/01/2015   PLT 394.0 12/01/2015   GLUCOSE 110* 12/01/2015   CHOL 188 11/12/2014   TRIG 179.0* 11/12/2014   HDL 37.00* 11/12/2014   LDLCALC 115* 11/12/2014   ALT 20 11/12/2014   AST 18 11/12/2014   NA 141 12/01/2015   K 4.2 12/01/2015   CL 103 12/01/2015   CREATININE 0.78 12/01/2015   BUN 9 12/01/2015   CO2 30 12/01/2015   TSH 0.74 12/01/2015   HGBA1C 5.8 12/01/2015    Dg Chest 2 View  11/07/2015  CLINICAL DATA:  Cough and congestion for 3 days, not getting better with over the counter medications, history hypertension and smoking EXAM: CHEST  2 VIEW COMPARISON:  820 1,014 FINDINGS: Upper normal heart size. Mediastinal contours and pulmonary vascularity normal. Lungs clear. No pulmonary infiltrate, pleural effusion or pneumothorax. Bones unremarkable. IMPRESSION: No acute abnormalities. Electronically Signed   By: Lavonia Dana M.D.   On: 11/07/2015 08:12    Assessment & Plan:   Iffany was seen today for hypertension and cough.  Diagnoses and all orders for this visit:  Essential hypertension- she reports a recent episode of severe hypokalemia, I do not have any records from that visit at [facility], I will change her diuretic therapy to one that contains a potassium sparing diuretic to avoid future episodes of hypokalemia, will discontinue the calcium channel blocker but will continue an ARB for blood pressure control. -     triamterene-hydrochlorothiazide (DYAZIDE) 37.5-25 MG capsule; Take 1 each (1 capsule total) by mouth daily. -     valsartan (DIOVAN) 160 MG tablet; Take 1 tablet (160 mg total) by mouth daily.  Other specified hypothyroidism- her TSH is in the normal  range, she will remain on the current dose of Synthroid. -     CBC with Differential/Platelet; Future -     TSH; Future  Visit for screening mammogram  Hyperglycemia- she is prediabetic and agrees to work on her lifestyle modifications. -     Basic metabolic panel; Future -     Hemoglobin A1c; Future -     EKG 12-Lead  Tobacco abuse  Hypokalemia- her potassium level is normal and her EKG only shows a benign finding of right bundle blanch block. Will change her diuretics to one that includes a potassium sparing diuretic. -     Magnesium; Future -     Basic metabolic panel; Future -     triamterene-hydrochlorothiazide (DYAZIDE) 37.5-25 MG capsule; Take 1 each (1 capsule total) by mouth daily.  Viral URI with cough -     chlorpheniramine-HYDROcodone (TUSSIONEX PENNKINETIC ER) 10-8 MG/5ML SUER; Take 5 mLs by mouth every 12 (twelve) hours as needed for cough.  I have discontinued Ms. Ney's diphenhydramine-acetaminophen, Phenylephrine-DM-GG, TRIBENZOR, azithromycin, and Olmesartan-Amlodipine-HCTZ. I am also having her start on triamterene-hydrochlorothiazide, valsartan, and chlorpheniramine-HYDROcodone. Additionally, I am having her  maintain her loratadine, levothyroxine, fluticasone, guaifenesin, and KLOR-CON M20.  Meds ordered this encounter  Medications  . KLOR-CON M20 20 MEQ tablet    Sig:   . triamterene-hydrochlorothiazide (DYAZIDE) 37.5-25 MG capsule    Sig: Take 1 each (1 capsule total) by mouth daily.    Dispense:  90 capsule    Refill:  0  . valsartan (DIOVAN) 160 MG tablet    Sig: Take 1 tablet (160 mg total) by mouth daily.    Dispense:  90 tablet    Refill:  0  . chlorpheniramine-HYDROcodone (TUSSIONEX PENNKINETIC ER) 10-8 MG/5ML SUER    Sig: Take 5 mLs by mouth every 12 (twelve) hours as needed for cough.    Dispense:  140 mL    Refill:  0     Follow-up: Return in about 4 weeks (around 12/29/2015).  Scarlette Calico, MD

## 2015-12-01 NOTE — Progress Notes (Signed)
Pre visit review using our clinic review tool, if applicable. No additional management support is needed unless otherwise documented below in the visit note. 

## 2015-12-22 ENCOUNTER — Encounter: Payer: BLUE CROSS/BLUE SHIELD | Admitting: Internal Medicine

## 2015-12-22 ENCOUNTER — Other Ambulatory Visit: Payer: Self-pay | Admitting: Internal Medicine

## 2016-02-22 ENCOUNTER — Encounter: Payer: Self-pay | Admitting: Internal Medicine

## 2016-02-22 ENCOUNTER — Ambulatory Visit (INDEPENDENT_AMBULATORY_CARE_PROVIDER_SITE_OTHER): Payer: BLUE CROSS/BLUE SHIELD | Admitting: Internal Medicine

## 2016-02-22 VITALS — BP 138/80 | HR 75 | Temp 98.0°F | Resp 16 | Ht 60.0 in | Wt 158.0 lb

## 2016-02-22 DIAGNOSIS — E038 Other specified hypothyroidism: Secondary | ICD-10-CM

## 2016-02-22 DIAGNOSIS — Z0000 Encounter for general adult medical examination without abnormal findings: Secondary | ICD-10-CM

## 2016-02-22 DIAGNOSIS — I10 Essential (primary) hypertension: Secondary | ICD-10-CM

## 2016-02-22 DIAGNOSIS — E876 Hypokalemia: Secondary | ICD-10-CM | POA: Diagnosis not present

## 2016-02-22 DIAGNOSIS — Z1231 Encounter for screening mammogram for malignant neoplasm of breast: Secondary | ICD-10-CM

## 2016-02-22 MED ORDER — TRIAMTERENE-HCTZ 37.5-25 MG PO CAPS: 1.0000 | ORAL_CAPSULE | Freq: Every day | ORAL | Status: DC

## 2016-02-22 MED ORDER — VALSARTAN 160 MG PO TABS: 160.0000 mg | ORAL_TABLET | Freq: Every day | ORAL | Status: DC

## 2016-02-22 NOTE — Patient Instructions (Signed)
Hypertension Hypertension, commonly called high blood pressure, is when the force of blood pumping through your arteries is too strong. Your arteries are the blood vessels that carry blood from your heart throughout your body. A blood pressure reading consists of a higher number over a lower number, such as 110/72. The higher number (systolic) is the pressure inside your arteries when your heart pumps. The lower number (diastolic) is the pressure inside your arteries when your heart relaxes. Ideally you want your blood pressure below 120/80. Hypertension forces your heart to work harder to pump blood. Your arteries may become narrow or stiff. Having untreated or uncontrolled hypertension can cause heart attack, stroke, kidney disease, and other problems. RISK FACTORS Some risk factors for high blood pressure are controllable. Others are not.  Risk factors you cannot control include:   Race. You may be at higher risk if you are African American.  Age. Risk increases with age.  Gender. Men are at higher risk than women before age 45 years. After age 65, women are at higher risk than men. Risk factors you can control include:  Not getting enough exercise or physical activity.  Being overweight.  Getting too much fat, sugar, calories, or salt in your diet.  Drinking too much alcohol. SIGNS AND SYMPTOMS Hypertension does not usually cause signs or symptoms. Extremely high blood pressure (hypertensive crisis) may cause headache, anxiety, shortness of breath, and nosebleed. DIAGNOSIS To check if you have hypertension, your health care provider will measure your blood pressure while you are seated, with your arm held at the level of your heart. It should be measured at least twice using the same arm. Certain conditions can cause a difference in blood pressure between your right and left arms. A blood pressure reading that is higher than normal on one occasion does not mean that you need treatment. If  it is not clear whether you have high blood pressure, you may be asked to return on a different day to have your blood pressure checked again. Or, you may be asked to monitor your blood pressure at home for 1 or more weeks. TREATMENT Treating high blood pressure includes making lifestyle changes and possibly taking medicine. Living a healthy lifestyle can help lower high blood pressure. You may need to change some of your habits. Lifestyle changes may include:  Following the DASH diet. This diet is high in fruits, vegetables, and whole grains. It is low in salt, red meat, and added sugars.  Keep your sodium intake below 2,300 mg per day.  Getting at least 30-45 minutes of aerobic exercise at least 4 times per week.  Losing weight if necessary.  Not smoking.  Limiting alcoholic beverages.  Learning ways to reduce stress. Your health care provider may prescribe medicine if lifestyle changes are not enough to get your blood pressure under control, and if one of the following is true:  You are 18-59 years of age and your systolic blood pressure is above 140.  You are 60 years of age or older, and your systolic blood pressure is above 150.  Your diastolic blood pressure is above 90.  You have diabetes, and your systolic blood pressure is over 140 or your diastolic blood pressure is over 90.  You have kidney disease and your blood pressure is above 140/90.  You have heart disease and your blood pressure is above 140/90. Your personal target blood pressure may vary depending on your medical conditions, your age, and other factors. HOME CARE INSTRUCTIONS    Have your blood pressure rechecked as directed by your health care provider.   Take medicines only as directed by your health care provider. Follow the directions carefully. Blood pressure medicines must be taken as prescribed. The medicine does not work as well when you skip doses. Skipping doses also puts you at risk for  problems.  Do not smoke.   Monitor your blood pressure at home as directed by your health care provider. SEEK MEDICAL CARE IF:   You think you are having a reaction to medicines taken.  You have recurrent headaches or feel dizzy.  You have swelling in your ankles.  You have trouble with your vision. SEEK IMMEDIATE MEDICAL CARE IF:  You develop a severe headache or confusion.  You have unusual weakness, numbness, or feel faint.  You have severe chest or abdominal pain.  You vomit repeatedly.  You have trouble breathing. MAKE SURE YOU:   Understand these instructions.  Will watch your condition.  Will get help right away if you are not doing well or get worse.   This information is not intended to replace advice given to you by your health care provider. Make sure you discuss any questions you have with your health care provider.   Document Released: 10/15/2005 Document Revised: 03/01/2015 Document Reviewed: 08/07/2013 Elsevier Interactive Patient Education 2016 Elsevier Inc.  

## 2016-02-22 NOTE — Progress Notes (Signed)
Pre visit review using our clinic review tool, if applicable. No additional management support is needed unless otherwise documented below in the visit note. 

## 2016-02-22 NOTE — Progress Notes (Signed)
Subjective:  Patient ID: Sarah Watts, female    DOB: May 03, 1966  Age: 50 y.o. MRN: [id number]  CC: Hypertension and Hypothyroidism   HPI LORAIN ZENS presents for a blood pressure check. Over the last few months she has lost weight with diet and exercise. She tells me her blood pressure is well-controlled on Dyazide and valsartan. She feels well today and offers no complaints.  Outpatient Prescriptions Prior to Visit  Medication Sig Dispense Refill  . fluticasone (FLONASE) 50 MCG/ACT nasal spray USE TWO SPRAY(S) IN EACH NOSTRIL ONCE DAILY 16 g 11  . KLOR-CON M20 20 MEQ tablet     . levothyroxine (SYNTHROID, LEVOTHROID) 75 MCG tablet TAKE ONE TABLET BY MOUTH ONCE DAILY 90 tablet 1  . loratadine (CLARITIN) 10 MG tablet Take 10 mg by mouth daily.      . chlorpheniramine-HYDROcodone (TUSSIONEX PENNKINETIC ER) 10-8 MG/5ML SUER Take 5 mLs by mouth every 12 (twelve) hours as needed for cough. 140 mL 0  . guaifenesin (ROBITUSSIN) 100 MG/5ML syrup Take 200 mg by mouth every 4 (four) hours as needed for cough.    . triamterene-hydrochlorothiazide (DYAZIDE) 37.5-25 MG capsule Take 1 each (1 capsule total) by mouth daily. 90 capsule 0  . valsartan (DIOVAN) 160 MG tablet Take 1 tablet (160 mg total) by mouth daily. 90 tablet 0   No facility-administered medications prior to visit.    ROS Review of Systems  Constitutional: Negative.  Negative for fever, chills, diaphoresis and fatigue.  HENT: Negative.   Eyes: Negative.  Negative for visual disturbance.  Respiratory: Negative.  Negative for cough, choking, chest tightness, shortness of breath and stridor.   Cardiovascular: Negative.  Negative for chest pain, palpitations and leg swelling.  Gastrointestinal: Negative.  Negative for nausea, vomiting, abdominal pain, diarrhea and constipation.  Endocrine: Negative.   Genitourinary: Negative.   Musculoskeletal: Negative.   Allergic/Immunologic: Negative.   Neurological: Negative.   Negative for dizziness, tremors, seizures, syncope, facial asymmetry, speech difficulty, light-headedness, numbness and headaches.  Hematological: Negative.  Negative for adenopathy. Does not bruise/bleed easily.  Psychiatric/Behavioral: Negative.     Objective:  BP 138/80 mmHg  Pulse 75  Temp(Src) 98 F (36.7 C) (Oral)  Resp 16  Ht 5' (1.524 m)  Wt 158 lb (71.668 kg)  BMI 30.86 kg/m2  SpO2 96%  BP Readings from Last 3 Encounters:  02/22/16 138/80  12/01/15 146/82  11/07/15 125/76    Wt Readings from Last 3 Encounters:  02/22/16 158 lb (71.668 kg)  12/01/15 168 lb (76.204 kg)  03/17/15 185 lb (83.915 kg)    Physical Exam  Constitutional: She is oriented to person, place, and time. No distress.  HENT:  Mouth/Throat: Oropharynx is clear and moist. No oropharyngeal exudate.  Eyes: Conjunctivae are normal. Right eye exhibits no discharge. Left eye exhibits no discharge. No scleral icterus.  Neck: Normal range of motion. Neck supple. No JVD present. No tracheal deviation present. No thyromegaly present.  Cardiovascular: Normal rate, regular rhythm, normal heart sounds and intact distal pulses.  Exam reveals no gallop and no friction rub.   No murmur heard. Pulmonary/Chest: Effort normal and breath sounds normal. No stridor. No respiratory distress. She has no wheezes. She has no rales. She exhibits no tenderness.  Abdominal: Soft. Bowel sounds are normal. She exhibits no distension and no mass. There is no tenderness. There is no rebound and no guarding.  Musculoskeletal: Normal range of motion. She exhibits no edema or tenderness.  Lymphadenopathy:    She  has no cervical adenopathy.  Neurological: She is oriented to person, place, and time.  Skin: Skin is warm and dry. No rash noted. She is not diaphoretic. No erythema. No pallor.  Vitals reviewed.   Lab Results  Component Value Date   WBC 11.8* 12/01/2015   HGB 14.6 12/01/2015   HCT 44.2 12/01/2015   PLT 394.0  12/01/2015   GLUCOSE 110* 12/01/2015   CHOL 188 11/12/2014   TRIG 179.0* 11/12/2014   HDL 37.00* 11/12/2014   LDLCALC 115* 11/12/2014   ALT 20 11/12/2014   AST 18 11/12/2014   NA 141 12/01/2015   K 4.2 12/01/2015   CL 103 12/01/2015   CREATININE 0.78 12/01/2015   BUN 9 12/01/2015   CO2 30 12/01/2015   TSH 0.74 12/01/2015   HGBA1C 5.8 12/01/2015    Dg Chest 2 View  11/07/2015  CLINICAL DATA:  Cough and congestion for 3 days, not getting better with over the counter medications, history hypertension and smoking EXAM: CHEST  2 VIEW COMPARISON:  820 1,014 FINDINGS: Upper normal heart size. Mediastinal contours and pulmonary vascularity normal. Lungs clear. No pulmonary infiltrate, pleural effusion or pneumothorax. Bones unremarkable. IMPRESSION: No acute abnormalities. Electronically Signed   By: Lavonia Dana M.D.   On: 11/07/2015 08:12    Assessment & Plan:   Kezaria was seen today for hypertension and hypothyroidism.  Diagnoses and all orders for this visit:  Essential hypertension- her blood pressure is well-controlled, recent electrolytes and renal function are stable. -     triamterene-hydrochlorothiazide (DYAZIDE) 37.5-25 MG capsule; Take 1 each (1 capsule total) by mouth daily. -     valsartan (DIOVAN) 160 MG tablet; Take 1 tablet (160 mg total) by mouth daily.  Hypokalemia- her potassium level has normalized on Dyazide and she has decided to stop taking the potassium supplement. -     triamterene-hydrochlorothiazide (DYAZIDE) 37.5-25 MG capsule; Take 1 each (1 capsule total) by mouth daily.  Other specified hypothyroidism- her recent TSH was in the normal range, she will remain on the current dose of Synthroid.  Visit for screening mammogram -     MM DIGITAL SCREENING BILATERAL; Future  Preventative health care  I have discontinued Ms. Malesky's guaifenesin and chlorpheniramine-HYDROcodone. I am also having her maintain her loratadine, fluticasone, KLOR-CON M20,  levothyroxine, triamterene-hydrochlorothiazide, and valsartan.  Meds ordered this encounter  Medications  . triamterene-hydrochlorothiazide (DYAZIDE) 37.5-25 MG capsule    Sig: Take 1 each (1 capsule total) by mouth daily.    Dispense:  90 capsule    Refill:  1  . valsartan (DIOVAN) 160 MG tablet    Sig: Take 1 tablet (160 mg total) by mouth daily.    Dispense:  90 tablet    Refill:  1     Follow-up: Return in about 4 weeks (around 03/21/2016).  Scarlette Calico, MD

## 2016-02-24 ENCOUNTER — Telehealth: Payer: Self-pay

## 2016-02-24 NOTE — Telephone Encounter (Signed)
Faxed cologuard form 

## 2016-03-07 LAB — COLOGUARD

## 2016-03-12 ENCOUNTER — Ambulatory Visit (INDEPENDENT_AMBULATORY_CARE_PROVIDER_SITE_OTHER): Payer: BLUE CROSS/BLUE SHIELD | Admitting: Internal Medicine

## 2016-03-12 ENCOUNTER — Other Ambulatory Visit (HOSPITAL_COMMUNITY)
Admission: RE | Admit: 2016-03-12 | Discharge: 2016-03-12 | Disposition: A | Discharge status: Home / Self Care | Payer: BLUE CROSS/BLUE SHIELD | Source: Ambulatory Visit | Attending: Internal Medicine | Admitting: Internal Medicine

## 2016-03-12 ENCOUNTER — Other Ambulatory Visit (INDEPENDENT_AMBULATORY_CARE_PROVIDER_SITE_OTHER): Payer: BLUE CROSS/BLUE SHIELD

## 2016-03-12 ENCOUNTER — Encounter: Payer: Self-pay | Admitting: Internal Medicine

## 2016-03-12 VITALS — BP 118/82 | HR 81 | Temp 98.4°F | Resp 16 | Ht 60.0 in | Wt 155.0 lb

## 2016-03-12 DIAGNOSIS — Z0000 Encounter for general adult medical examination without abnormal findings: Secondary | ICD-10-CM

## 2016-03-12 DIAGNOSIS — Z1231 Encounter for screening mammogram for malignant neoplasm of breast: Secondary | ICD-10-CM

## 2016-03-12 DIAGNOSIS — Z01419 Encounter for gynecological examination (general) (routine) without abnormal findings: Secondary | ICD-10-CM | POA: Insufficient documentation

## 2016-03-12 DIAGNOSIS — L989 Disorder of the skin and subcutaneous tissue, unspecified: Secondary | ICD-10-CM

## 2016-03-12 DIAGNOSIS — Z113 Encounter for screening for infections with a predominantly sexual mode of transmission: Secondary | ICD-10-CM | POA: Insufficient documentation

## 2016-03-12 DIAGNOSIS — Z1151 Encounter for screening for human papillomavirus (HPV): Secondary | ICD-10-CM | POA: Diagnosis not present

## 2016-03-12 LAB — COMPREHENSIVE METABOLIC PANEL
ALT: 11 U/L (ref 0–35)
AST: 12 U/L (ref 0–37)
Albumin: 4.5 g/dL (ref 3.5–5.2)
Alkaline Phosphatase: 59 U/L (ref 39–117)
BUN: 17 mg/dL (ref 6–23)
CHLORIDE: 101 meq/L (ref 96–112)
CO2: 30 mEq/L (ref 19–32)
CREATININE: 0.82 mg/dL (ref 0.40–1.20)
Calcium: 10 mg/dL (ref 8.4–10.5)
GFR: 78.1 mL/min (ref >60.00)
Glucose, Bld: 109 mg/dL — ABNORMAL HIGH (ref 70–99)
Potassium: 3.5 mEq/L (ref 3.5–5.1)
SODIUM: 139 meq/L (ref 135–145)
TOTAL PROTEIN: 7.2 g/dL (ref 6.0–8.3)
Total Bilirubin: 0.8 mg/dL (ref 0.2–1.2)

## 2016-03-12 LAB — CBC WITH DIFFERENTIAL/PLATELET
BASOS PCT: 0.5 % (ref 0.0–3.0)
Basophils Absolute: 0.1 10*3/uL (ref 0.0–0.1)
Eosinophils Absolute: 0.3 10*3/uL (ref 0.0–0.7)
Eosinophils Relative: 2.9 % (ref 0.0–5.0)
HCT: 44.3 % (ref 36.0–46.0)
HEMOGLOBIN: 15 g/dL (ref 12.0–15.0)
Lymphocytes Relative: 28.9 % (ref 12.0–46.0)
Lymphs Abs: 3.1 10*3/uL (ref 0.7–4.0)
MCHC: 33.9 g/dL (ref 30.0–36.0)
MCV: 85.6 fl (ref 78.0–100.0)
Monocytes Absolute: 0.6 10*3/uL (ref 0.1–1.0)
Monocytes Relative: 6.1 % (ref 3.0–12.0)
Neutro Abs: 6.5 10*3/uL (ref 1.4–7.7)
Neutrophils Relative %: 61.6 % (ref 43.0–77.0)
PLATELETS: 446 10*3/uL — AB (ref 150.0–400.0)
RBC: 5.18 Mil/uL — ABNORMAL HIGH (ref 3.87–5.11)
RDW: 13.5 % (ref 11.5–15.5)
WBC: 10.6 10*3/uL — ABNORMAL HIGH (ref 4.0–10.5)

## 2016-03-12 LAB — LIPID PANEL
CHOLESTEROL: 186 mg/dL (ref 0–200)
HDL: 32.9 mg/dL — ABNORMAL LOW (ref >39.00)
LDL Cholesterol: 118 mg/dL — ABNORMAL HIGH (ref 0–99)
NonHDL: 153.49
Total CHOL/HDL Ratio: 6
Triglycerides: 176 mg/dL — ABNORMAL HIGH (ref 0.0–149.0)
VLDL: 35.2 mg/dL (ref 0.0–40.0)

## 2016-03-12 LAB — TSH: TSH: 0.47 u[IU]/mL (ref 0.35–4.50)

## 2016-03-12 LAB — HM PAP SMEAR

## 2016-03-12 NOTE — Patient Instructions (Signed)
Preventive Care for Adults, Female A healthy lifestyle and preventive care can promote health and wellness. Preventive health guidelines for women include the following key practices.  A routine yearly physical is a good way to check with your health care provider about your health and preventive screening. It is a chance to share any concerns and updates on your health and to receive a thorough exam.  Visit your dentist for a routine exam and preventive care every 6 months. Brush your teeth twice a day and floss once a day. Good oral hygiene prevents tooth decay and gum disease.  The frequency of eye exams is based on your age, health, family medical history, use of contact lenses, and other factors. Follow your health care provider's recommendations for frequency of eye exams.  Eat a healthy diet. Foods like vegetables, fruits, whole grains, low-fat dairy products, and lean protein foods contain the nutrients you need without too many calories. Decrease your intake of foods high in solid fats, added sugars, and salt. Eat the right amount of calories for you.Get information about a proper diet from your health care provider, if necessary.  Regular physical exercise is one of the most important things you can do for your health. Most adults should get at least 150 minutes of moderate-intensity exercise (any activity that increases your heart rate and causes you to sweat) each week. In addition, most adults need muscle-strengthening exercises on 2 or more days a week.  Maintain a healthy weight. The body mass index (BMI) is a screening tool to identify possible weight problems. It provides an estimate of body fat based on height and weight. Your health care provider can find your BMI and can help you achieve or maintain a healthy weight.For adults 20 years and older:  A BMI below 18.5 is considered underweight.  A BMI of 18.5 to 24.9 is normal.  A BMI of 25 to 29.9 is considered overweight.  A  BMI of 30 and above is considered obese.  Maintain normal blood lipids and cholesterol levels by exercising and minimizing your intake of saturated fat. Eat a balanced diet with plenty of fruit and vegetables. Blood tests for lipids and cholesterol should begin at age 45 and be repeated every 5 years. If your lipid or cholesterol levels are high, you are over 50, or you are at high risk for heart disease, you may need your cholesterol levels checked more frequently.Ongoing high lipid and cholesterol levels should be treated with medicines if diet and exercise are not working.  If you smoke, find out from your health care provider how to quit. If you do not use tobacco, do not start.  Lung cancer screening is recommended for adults aged 45-80 years who are at high risk for developing lung cancer because of a history of smoking. A yearly low-dose CT scan of the lungs is recommended for people who have at least a 30-pack-year history of smoking and are a current smoker or have quit within the past 15 years. A pack year of smoking is smoking an average of 1 pack of cigarettes a day for 1 year (for example: 1 pack a day for 30 years or 2 packs a day for 15 years). Yearly screening should continue until the smoker has stopped smoking for at least 15 years. Yearly screening should be stopped for people who develop a health problem that would prevent them from having lung cancer treatment.  If you are pregnant, do not drink alcohol. If you are  breastfeeding, be very cautious about drinking alcohol. If you are not pregnant and choose to drink alcohol, do not have more than 1 drink per day. One drink is considered to be 12 ounces (355 mL) of beer, 5 ounces (148 mL) of wine, or 1.5 ounces (44 mL) of liquor.  Avoid use of street drugs. Do not share needles with anyone. Ask for help if you need support or instructions about stopping the use of drugs.  High blood pressure causes heart disease and increases the risk  of stroke. Your blood pressure should be checked at least every 1 to 2 years. Ongoing high blood pressure should be treated with medicines if weight loss and exercise do not work.  If you are 55-79 years old, ask your health care provider if you should take aspirin to prevent strokes.  Diabetes screening is done by taking a blood sample to check your blood glucose level after you have not eaten for a certain period of time (fasting). If you are not overweight and you do not have risk factors for diabetes, you should be screened once every 3 years starting at age 45. If you are overweight or obese and you are 40-70 years of age, you should be screened for diabetes every year as part of your cardiovascular risk assessment.  Breast cancer screening is essential preventive care for women. You should practice "breast self-awareness." This means understanding the normal appearance and feel of your breasts and may include breast self-examination. Any changes detected, no matter how small, should be reported to a health care provider. Women in their 20s and 30s should have a clinical breast exam (CBE) by a health care provider as part of a regular health exam every 1 to 3 years. After age 40, women should have a CBE every year. Starting at age 40, women should consider having a mammogram (breast X-ray test) every year. Women who have a family history of breast cancer should talk to their health care provider about genetic screening. Women at a high risk of breast cancer should talk to their health care providers about having an MRI and a mammogram every year.  Breast cancer gene (BRCA)-related cancer risk assessment is recommended for women who have family members with BRCA-related cancers. BRCA-related cancers include breast, ovarian, tubal, and peritoneal cancers. Having family members with these cancers may be associated with an increased risk for harmful changes (mutations) in the breast cancer genes BRCA1 and  BRCA2. Results of the assessment will determine the need for genetic counseling and BRCA1 and BRCA2 testing.  Your health care provider may recommend that you be screened regularly for cancer of the pelvic organs (ovaries, uterus, and vagina). This screening involves a pelvic examination, including checking for microscopic changes to the surface of your cervix (Pap test). You may be encouraged to have this screening done every 3 years, beginning at age 21.  For women ages 30-65, health care providers may recommend pelvic exams and Pap testing every 3 years, or they may recommend the Pap and pelvic exam, combined with testing for human papilloma virus (HPV), every 5 years. Some types of HPV increase your risk of cervical cancer. Testing for HPV may also be done on women of any age with unclear Pap test results.  Other health care providers may not recommend any screening for nonpregnant women who are considered low risk for pelvic cancer and who do not have symptoms. Ask your health care provider if a screening pelvic exam is right for   you.  If you have had past treatment for cervical cancer or a condition that could lead to cancer, you need Pap tests and screening for cancer for at least 20 years after your treatment. If Pap tests have been discontinued, your risk factors (such as having a new sexual partner) need to be reassessed to determine if screening should resume. Some women have medical problems that increase the chance of getting cervical cancer. In these cases, your health care provider may recommend more frequent screening and Pap tests.  Colorectal cancer can be detected and often prevented. Most routine colorectal cancer screening begins at the age of 50 years and continues through age 75 years. However, your health care provider may recommend screening at an earlier age if you have risk factors for colon cancer. On a yearly basis, your health care provider may provide home test kits to check  for hidden blood in the stool. Use of a small camera at the end of a tube, to directly examine the colon (sigmoidoscopy or colonoscopy), can detect the earliest forms of colorectal cancer. Talk to your health care provider about this at age 50, when routine screening begins. Direct exam of the colon should be repeated every 5-10 years through age 75 years, unless early forms of precancerous polyps or small growths are found.  People who are at an increased risk for hepatitis B should be screened for this virus. You are considered at high risk for hepatitis B if:  You were born in a country where hepatitis B occurs often. Talk with your health care provider about which countries are considered high risk.  Your parents were born in a high-risk country and you have not received a shot to protect against hepatitis B (hepatitis B vaccine).  You have HIV or AIDS.  You use needles to inject street drugs.  You live with, or have sex with, someone who has hepatitis B.  You get hemodialysis treatment.  You take certain medicines for conditions like cancer, organ transplantation, and autoimmune conditions.  Hepatitis C blood testing is recommended for all people born from 1945 through 1965 and any individual with known risks for hepatitis C.  Practice safe sex. Use condoms and avoid high-risk sexual practices to reduce the spread of sexually transmitted infections (STIs). STIs include gonorrhea, chlamydia, syphilis, trichomonas, herpes, HPV, and human immunodeficiency virus (HIV). Herpes, HIV, and HPV are viral illnesses that have no cure. They can result in disability, cancer, and death.  You should be screened for sexually transmitted illnesses (STIs) including gonorrhea and chlamydia if:  You are sexually active and are younger than 24 years.  You are older than 24 years and your health care provider tells you that you are at risk for this type of infection.  Your sexual activity has changed  since you were last screened and you are at an increased risk for chlamydia or gonorrhea. Ask your health care provider if you are at risk.  If you are at risk of being infected with HIV, it is recommended that you take a prescription medicine daily to prevent HIV infection. This is called preexposure prophylaxis (PrEP). You are considered at risk if:  You are sexually active and do not regularly use condoms or know the HIV status of your partner(s).  You take drugs by injection.  You are sexually active with a partner who has HIV.  Talk with your health care provider about whether you are at high risk of being infected with HIV. If   you choose to begin PrEP, you should first be tested for HIV. You should then be tested every 3 months for as long as you are taking PrEP.  Osteoporosis is a disease in which the bones lose minerals and strength with aging. This can result in serious bone fractures or breaks. The risk of osteoporosis can be identified using a bone density scan. Women ages 67 years and over and women at risk for fractures or osteoporosis should discuss screening with their health care providers. Ask your health care provider whether you should take a calcium supplement or vitamin D to reduce the rate of osteoporosis.  Menopause can be associated with physical symptoms and risks. Hormone replacement therapy is available to decrease symptoms and risks. You should talk to your health care provider about whether hormone replacement therapy is right for you.  Use sunscreen. Apply sunscreen liberally and repeatedly throughout the day. You should seek shade when your shadow is shorter than you. Protect yourself by wearing long sleeves, pants, a wide-brimmed hat, and sunglasses year round, whenever you are outdoors.  Once a month, do a whole body skin exam, using a mirror to look at the skin on your back. Tell your health care provider of new moles, moles that have irregular borders, moles that  are larger than a pencil eraser, or moles that have changed in shape or color.  Stay current with required vaccines (immunizations).  Influenza vaccine. All adults should be immunized every year.  Tetanus, diphtheria, and acellular pertussis (Td, Tdap) vaccine. Pregnant women should receive 1 dose of Tdap vaccine during each pregnancy. The dose should be obtained regardless of the length of time since the last dose. Immunization is preferred during the 27th-36th week of gestation. An adult who has not previously received Tdap or who does not know her vaccine status should receive 1 dose of Tdap. This initial dose should be followed by tetanus and diphtheria toxoids (Td) booster doses every 10 years. Adults with an unknown or incomplete history of completing a 3-dose immunization series with Td-containing vaccines should begin or complete a primary immunization series including a Tdap dose. Adults should receive a Td booster every 10 years.  Varicella vaccine. An adult without evidence of immunity to varicella should receive 2 doses or a second dose if she has previously received 1 dose. Pregnant females who do not have evidence of immunity should receive the first dose after pregnancy. This first dose should be obtained before leaving the health care facility. The second dose should be obtained 4-8 weeks after the first dose.  Human papillomavirus (HPV) vaccine. Females aged 13-26 years who have not received the vaccine previously should obtain the 3-dose series. The vaccine is not recommended for use in pregnant females. However, pregnancy testing is not needed before receiving a dose. If a female is found to be pregnant after receiving a dose, no treatment is needed. In that case, the remaining doses should be delayed until after the pregnancy. Immunization is recommended for any person with an immunocompromised condition through the age of 61 years if she did not get any or all doses earlier. During the  3-dose series, the second dose should be obtained 4-8 weeks after the first dose. The third dose should be obtained 24 weeks after the first dose and 16 weeks after the second dose.  Zoster vaccine. One dose is recommended for adults aged 30 years or older unless certain conditions are present.  Measles, mumps, and rubella (MMR) vaccine. Adults born  before 1957 generally are considered immune to measles and mumps. Adults born in 1957 or later should have 1 or more doses of MMR vaccine unless there is a contraindication to the vaccine or there is laboratory evidence of immunity to each of the three diseases. A routine second dose of MMR vaccine should be obtained at least 28 days after the first dose for students attending postsecondary schools, health care workers, or international travelers. People who received inactivated measles vaccine or an unknown type of measles vaccine during 1963-1967 should receive 2 doses of MMR vaccine. People who received inactivated mumps vaccine or an unknown type of mumps vaccine before 1979 and are at high risk for mumps infection should consider immunization with 2 doses of MMR vaccine. For females of childbearing age, rubella immunity should be determined. If there is no evidence of immunity, females who are not pregnant should be vaccinated. If there is no evidence of immunity, females who are pregnant should delay immunization until after pregnancy. Unvaccinated health care workers born before 1957 who lack laboratory evidence of measles, mumps, or rubella immunity or laboratory confirmation of disease should consider measles and mumps immunization with 2 doses of MMR vaccine or rubella immunization with 1 dose of MMR vaccine.  Pneumococcal 13-valent conjugate (PCV13) vaccine. When indicated, a person who is uncertain of his immunization history and has no record of immunization should receive the PCV13 vaccine. All adults 65 years of age and older should receive this  vaccine. An adult aged 19 years or older who has certain medical conditions and has not been previously immunized should receive 1 dose of PCV13 vaccine. This PCV13 should be followed with a dose of pneumococcal polysaccharide (PPSV23) vaccine. Adults who are at high risk for pneumococcal disease should obtain the PPSV23 vaccine at least 8 weeks after the dose of PCV13 vaccine. Adults older than 51 years of age who have normal immune system function should obtain the PPSV23 vaccine dose at least 1 year after the dose of PCV13 vaccine.  Pneumococcal polysaccharide (PPSV23) vaccine. When PCV13 is also indicated, PCV13 should be obtained first. All adults aged 65 years and older should be immunized. An adult younger than age 65 years who has certain medical conditions should be immunized. Any person who resides in a nursing home or long-term care facility should be immunized. An adult smoker should be immunized. People with an immunocompromised condition and certain other conditions should receive both PCV13 and PPSV23 vaccines. People with human immunodeficiency virus (HIV) infection should be immunized as soon as possible after diagnosis. Immunization during chemotherapy or radiation therapy should be avoided. Routine use of PPSV23 vaccine is not recommended for American Indians, [place] Natives, or people younger than 65 years unless there are medical conditions that require PPSV23 vaccine. When indicated, people who have unknown immunization and have no record of immunization should receive PPSV23 vaccine. One-time revaccination 5 years after the first dose of PPSV23 is recommended for people aged 19-64 years who have chronic kidney failure, nephrotic syndrome, asplenia, or immunocompromised conditions. People who received 1-2 doses of PPSV23 before age 65 years should receive another dose of PPSV23 vaccine at age 65 years or later if at least 5 years have passed since the previous dose. Doses of PPSV23 are not  needed for people immunized with PPSV23 at or after age 65 years.  Meningococcal vaccine. Adults with asplenia or persistent complement component deficiencies should receive 2 doses of quadrivalent meningococcal conjugate (MenACWY-D) vaccine. The doses should be obtained   at least 2 months apart. Microbiologists working with certain meningococcal bacteria, [place] recruits, people at risk during an outbreak, and people who travel to or live in countries with a high rate of meningitis should be immunized. A first-year college student up through age 34 years who is living in a residence hall should receive a dose if she did not receive a dose on or after her 16th birthday. Adults who have certain high-risk conditions should receive one or more doses of vaccine.  Hepatitis A vaccine. Adults who wish to be protected from this disease, have certain high-risk conditions, work with hepatitis A-infected animals, work in hepatitis A research labs, or travel to or work in countries with a high rate of hepatitis A should be immunized. Adults who were previously unvaccinated and who anticipate close contact with an international adoptee during the first 60 days after arrival in the [place] from a country with a high rate of hepatitis A should be immunized.  Hepatitis B vaccine. Adults who wish to be protected from this disease, have certain high-risk conditions, may be exposed to blood or other infectious body fluids, are household contacts or sex partners of hepatitis B positive people, are clients or workers in certain care facilities, or travel to or work in countries with a high rate of hepatitis B should be immunized.  Haemophilus influenzae type b (Hib) vaccine. A previously unvaccinated person with asplenia or sickle cell disease or having a scheduled splenectomy should receive 1 dose of Hib vaccine. Regardless of previous immunization, a recipient of a hematopoietic stem cell transplant should receive a  3-dose series 6-12 months after her successful transplant. Hib vaccine is not recommended for adults with HIV infection. Preventive Services / Frequency Ages 35 to 4 years  Blood pressure check.** / Every 3-5 years.  Lipid and cholesterol check.** / Every 5 years beginning at age 60.  Clinical breast exam.** / Every 3 years for women in their 71s and 10s.  BRCA-related cancer risk assessment.** / For women who have family members with a BRCA-related cancer (breast, ovarian, tubal, or peritoneal cancers).  Pap test.** / Every 2 years from ages 76 through 26. Every 3 years starting at age 61 through age 76 or 93 with a history of 3 consecutive normal Pap tests.  HPV screening.** / Every 3 years from ages 37 through ages 60 to 51 with a history of 3 consecutive normal Pap tests.  Hepatitis C blood test.** / For any individual with known risks for hepatitis C.  Skin self-exam. / Monthly.  Influenza vaccine. / Every year.  Tetanus, diphtheria, and acellular pertussis (Tdap, Td) vaccine.** / Consult your health care provider. Pregnant women should receive 1 dose of Tdap vaccine during each pregnancy. 1 dose of Td every 10 years.  Varicella vaccine.** / Consult your health care provider. Pregnant females who do not have evidence of immunity should receive the first dose after pregnancy.  HPV vaccine. / 3 doses over 6 months, if 93 and younger. The vaccine is not recommended for use in pregnant females. However, pregnancy testing is not needed before receiving a dose.  Measles, mumps, rubella (MMR) vaccine.** / You need at least 1 dose of MMR if you were born in 1957 or later. You may also need a 2nd dose. For females of childbearing age, rubella immunity should be determined. If there is no evidence of immunity, females who are not pregnant should be vaccinated. If there is no evidence of immunity, females who are  pregnant should delay immunization until after pregnancy.  Pneumococcal  13-valent conjugate (PCV13) vaccine.** / Consult your health care provider.  Pneumococcal polysaccharide (PPSV23) vaccine.** / 1 to 2 doses if you smoke cigarettes or if you have certain conditions.  Meningococcal vaccine.** / 1 dose if you are age 68 to 8 years and a Market researcher living in a residence hall, or have one of several medical conditions, you need to get vaccinated against meningococcal disease. You may also need additional booster doses.  Hepatitis A vaccine.** / Consult your health care provider.  Hepatitis B vaccine.** / Consult your health care provider.  Haemophilus influenzae type b (Hib) vaccine.** / Consult your health care provider. Ages 7 to 53 years  Blood pressure check.** / Every year.  Lipid and cholesterol check.** / Every 5 years beginning at age 25 years.  Lung cancer screening. / Every year if you are aged 11-80 years and have a 30-pack-year history of smoking and currently smoke or have quit within the past 15 years. Yearly screening is stopped once you have quit smoking for at least 15 years or develop a health problem that would prevent you from having lung cancer treatment.  Clinical breast exam.** / Every year after age 48 years.  BRCA-related cancer risk assessment.** / For women who have family members with a BRCA-related cancer (breast, ovarian, tubal, or peritoneal cancers).  Mammogram.** / Every year beginning at age 41 years and continuing for as long as you are in good health. Consult with your health care provider.  Pap test.** / Every 3 years starting at age 65 years through age 37 or 70 years with a history of 3 consecutive normal Pap tests.  HPV screening.** / Every 3 years from ages 72 years through ages 60 to 40 years with a history of 3 consecutive normal Pap tests.  Fecal occult blood test (FOBT) of stool. / Every year beginning at age 21 years and continuing until age 5 years. You may not need to do this test if you get  a colonoscopy every 10 years.  Flexible sigmoidoscopy or colonoscopy.** / Every 5 years for a flexible sigmoidoscopy or every 10 years for a colonoscopy beginning at age 35 years and continuing until age 48 years.  Hepatitis C blood test.** / For all people born from [birth year] through 1965 and any individual with known risks for hepatitis C.  Skin self-exam. / Monthly.  Influenza vaccine. / Every year.  Tetanus, diphtheria, and acellular pertussis (Tdap/Td) vaccine.** / Consult your health care provider. Pregnant women should receive 1 dose of Tdap vaccine during each pregnancy. 1 dose of Td every 10 years.  Varicella vaccine.** / Consult your health care provider. Pregnant females who do not have evidence of immunity should receive the first dose after pregnancy.  Zoster vaccine.** / 1 dose for adults aged 30 years or older.  Measles, mumps, rubella (MMR) vaccine.** / You need at least 1 dose of MMR if you were born in 1957 or later. You may also need a second dose. For females of childbearing age, rubella immunity should be determined. If there is no evidence of immunity, females who are not pregnant should be vaccinated. If there is no evidence of immunity, females who are pregnant should delay immunization until after pregnancy.  Pneumococcal 13-valent conjugate (PCV13) vaccine.** / Consult your health care provider.  Pneumococcal polysaccharide (PPSV23) vaccine.** / 1 to 2 doses if you smoke cigarettes or if you have certain conditions.  Meningococcal vaccine.** /  Consult your health care provider.  Hepatitis A vaccine.** / Consult your health care provider.  Hepatitis B vaccine.** / Consult your health care provider.  Haemophilus influenzae type b (Hib) vaccine.** / Consult your health care provider. Ages 64 years and over  Blood pressure check.** / Every year.  Lipid and cholesterol check.** / Every 5 years beginning at age 23 years.  Lung cancer screening. / Every year if you  are aged 16-80 years and have a 30-pack-year history of smoking and currently smoke or have quit within the past 15 years. Yearly screening is stopped once you have quit smoking for at least 15 years or develop a health problem that would prevent you from having lung cancer treatment.  Clinical breast exam.** / Every year after age 74 years.  BRCA-related cancer risk assessment.** / For women who have family members with a BRCA-related cancer (breast, ovarian, tubal, or peritoneal cancers).  Mammogram.** / Every year beginning at age 44 years and continuing for as long as you are in good health. Consult with your health care provider.  Pap test.** / Every 3 years starting at age 58 years through age 22 or 39 years with 3 consecutive normal Pap tests. Testing can be stopped between 65 and 70 years with 3 consecutive normal Pap tests and no abnormal Pap or HPV tests in the past 10 years.  HPV screening.** / Every 3 years from ages 64 years through ages 70 or 61 years with a history of 3 consecutive normal Pap tests. Testing can be stopped between 65 and 70 years with 3 consecutive normal Pap tests and no abnormal Pap or HPV tests in the past 10 years.  Fecal occult blood test (FOBT) of stool. / Every year beginning at age 40 years and continuing until age 27 years. You may not need to do this test if you get a colonoscopy every 10 years.  Flexible sigmoidoscopy or colonoscopy.** / Every 5 years for a flexible sigmoidoscopy or every 10 years for a colonoscopy beginning at age 7 years and continuing until age 32 years.  Hepatitis C blood test.** / For all people born from [birth year] through 1966 and any individual with known risks for hepatitis C.  Osteoporosis screening.** / A one-time screening for women ages 30 years and over and women at risk for fractures or osteoporosis.  Skin self-exam. / Monthly.  Influenza vaccine. / Every year.  Tetanus, diphtheria, and acellular pertussis (Tdap/Td)  vaccine.** / 1 dose of Td every 10 years.  Varicella vaccine.** / Consult your health care provider.  Zoster vaccine.** / 1 dose for adults aged 35 years or older.  Pneumococcal 13-valent conjugate (PCV13) vaccine.** / Consult your health care provider.  Pneumococcal polysaccharide (PPSV23) vaccine.** / 1 dose for all adults aged 46 years and older.  Meningococcal vaccine.** / Consult your health care provider.  Hepatitis A vaccine.** / Consult your health care provider.  Hepatitis B vaccine.** / Consult your health care provider.  Haemophilus influenzae type b (Hib) vaccine.** / Consult your health care provider. ** Family history and personal history of risk and conditions may change your health care provider's recommendations.   This information is not intended to replace advice given to you by your health care provider. Make sure you discuss any questions you have with your health care provider.   Document Released: 12/11/2001 Document Revised: 11/05/2014 Document Reviewed: 03/12/2011 Elsevier Interactive Patient Education [employer].

## 2016-03-12 NOTE — Progress Notes (Signed)
Pre visit review using our clinic review tool, if applicable. No additional management support is needed unless otherwise documented below in the visit note. 

## 2016-03-12 NOTE — Progress Notes (Signed)
Subjective:  Patient ID: Sarah Watts, female    DOB: [birth year]  Age: 50 y.o. MRN: [id number]  CC: Hypertension; Hypothyroidism; and Annual Exam   HPI Sarah Watts presents for a CPX - she is concerned about a lesion on the left side of her face, it feels scaly to her.  Outpatient Prescriptions Prior to Visit  Medication Sig Dispense Refill  . fluticasone (FLONASE) 50 MCG/ACT nasal spray USE TWO SPRAY(S) IN EACH NOSTRIL ONCE DAILY 16 g 11  . KLOR-CON M20 20 MEQ tablet     . levothyroxine (SYNTHROID, LEVOTHROID) 75 MCG tablet TAKE ONE TABLET BY MOUTH ONCE DAILY 90 tablet 1  . loratadine (CLARITIN) 10 MG tablet Take 10 mg by mouth daily.      Marland Kitchen triamterene-hydrochlorothiazide (DYAZIDE) 37.5-25 MG capsule Take 1 each (1 capsule total) by mouth daily. 90 capsule 1  . valsartan (DIOVAN) 160 MG tablet Take 1 tablet (160 mg total) by mouth daily. 90 tablet 1   No facility-administered medications prior to visit.    ROS Review of Systems  Constitutional: Negative.  Negative for fever, chills and fatigue.  HENT: Negative.   Eyes: Negative.   Respiratory: Negative.  Negative for cough, choking, shortness of breath and stridor.   Cardiovascular: Negative.  Negative for chest pain, palpitations and leg swelling.  Gastrointestinal: Negative.  Negative for abdominal pain.  Endocrine: Negative.   Genitourinary: Negative.  Negative for vaginal bleeding and vaginal discharge.  Musculoskeletal: Negative.   Allergic/Immunologic: Negative.   Neurological: Negative.  Negative for dizziness.  Hematological: Negative.  Negative for adenopathy. Does not bruise/bleed easily.  Psychiatric/Behavioral: Negative.   All other systems reviewed and are negative.   Objective:  BP 118/82 mmHg  Pulse 81  Temp(Src) 98.4 F (36.9 C) (Oral)  Resp 16  Ht 5' (1.524 m)  Wt 155 lb (70.308 kg)  BMI 30.27 kg/m2  SpO2 98%  BP Readings from Last 3 Encounters:  03/12/16 118/82  02/22/16 138/80    12/01/15 146/82    Wt Readings from Last 3 Encounters:  03/12/16 155 lb (70.308 kg)  02/22/16 158 lb (71.668 kg)  12/01/15 168 lb (76.204 kg)    Physical Exam  Constitutional: She is oriented to person, place, and time. She appears well-developed and well-nourished. No distress.  HENT:  Head: Normocephalic and atraumatic.  Mouth/Throat: Oropharynx is clear and moist. No oropharyngeal exudate.  Eyes: Conjunctivae are normal. Right eye exhibits no discharge. Left eye exhibits no discharge. No scleral icterus.  Neck: Normal range of motion. Neck supple. No JVD present. No tracheal deviation present. No thyromegaly present.  Cardiovascular: Normal rate, regular rhythm, normal heart sounds and intact distal pulses.  Exam reveals no gallop and no friction rub.   No murmur heard. Pulmonary/Chest: Effort normal and breath sounds normal. No stridor. No respiratory distress. She has no wheezes. She has no rales. She exhibits no tenderness.  Abdominal: Soft. Bowel sounds are normal. She exhibits no distension and no mass. There is no tenderness. There is no rebound and no guarding. Hernia confirmed negative in the right inguinal area and confirmed negative in the left inguinal area.  Genitourinary: Rectum normal, vagina normal and uterus normal. Rectal exam shows no external hemorrhoid, no internal hemorrhoid, no fissure, no mass, no tenderness and anal tone normal. Guaiac negative stool. No breast swelling, tenderness or discharge. Pelvic exam was performed with patient supine. No labial fusion. There is no rash, tenderness, lesion or injury on the right labia. There is  no rash, tenderness, lesion or injury on the left labia. Uterus is not deviated, not enlarged, not fixed and not tender. Cervix exhibits no motion tenderness, no discharge and no friability. Right adnexum displays no mass, no tenderness and no fullness. Left adnexum displays no mass, no tenderness and no fullness. No erythema, tenderness  or bleeding in the vagina. No foreign body around the vagina. No signs of injury around the vagina. No vaginal discharge found.  Musculoskeletal: Normal range of motion. She exhibits no edema or tenderness.  Lymphadenopathy:    She has no cervical adenopathy.       Right: No inguinal adenopathy present.       Left: No inguinal adenopathy present.  Neurological: She is oriented to person, place, and time.  Skin: Skin is warm and dry. No rash noted. She is not diaphoretic. No erythema. No pallor.     Vitals reviewed.   Lab Results  Component Value Date   WBC 10.6* 03/12/2016   HGB 15.0 03/12/2016   HCT 44.3 03/12/2016   PLT 446.0* 03/12/2016   GLUCOSE 109* 03/12/2016   CHOL 186 03/12/2016   TRIG 176.0* 03/12/2016   HDL 32.90* 03/12/2016   LDLCALC 118* 03/12/2016   ALT 11 03/12/2016   AST 12 03/12/2016   NA 139 03/12/2016   K 3.5 03/12/2016   CL 101 03/12/2016   CREATININE 0.82 03/12/2016   BUN 17 03/12/2016   CO2 30 03/12/2016   TSH 0.47 03/12/2016   HGBA1C 5.8 12/01/2015    Dg Chest 2 View  11/07/2015  CLINICAL DATA:  Cough and congestion for 3 days, not getting better with over the counter medications, history hypertension and smoking EXAM: CHEST  2 VIEW COMPARISON:  820 1,014 FINDINGS: Upper normal heart size. Mediastinal contours and pulmonary vascularity normal. Lungs clear. No pulmonary infiltrate, pleural effusion or pneumothorax. Bones unremarkable. IMPRESSION: No acute abnormalities. Electronically Signed   By: Lavonia Dana M.D.   On: 11/07/2015 08:12    Assessment & Plan:   Sarah Watts was seen today for hypertension, hypothyroidism and annual exam.  Diagnoses and all orders for this visit:  Routine general medical examination at a health care facility- Exam completed, Pap smear was collected and sent, she is completing a Cologuard screening, she was referred for a mammogram, vaccines were reviewed, patient education material was given. -     Lipid panel; Future -      Comprehensive metabolic panel; Future -     CBC with Differential/Platelet; Future -     TSH; Future -     Cytology - PAP  Visit for screening mammogram -     MM DIGITAL SCREENING BILATERAL; Future  Lesion of skin of face -     Ambulatory referral to Dermatology  I am having Ms. Calica maintain her loratadine, fluticasone, KLOR-CON M20, levothyroxine, triamterene-hydrochlorothiazide, and valsartan.  No orders of the defined types were placed in this encounter.     Follow-up: Return in about 6 months (around 09/12/2016).  Scarlette Calico, MD

## 2016-03-13 ENCOUNTER — Encounter: Payer: Self-pay | Admitting: Internal Medicine

## 2016-03-13 LAB — FECAL OCCULT BLOOD, GUAIAC: Fecal Occult Blood: NEGATIVE

## 2016-03-14 ENCOUNTER — Encounter: Payer: Self-pay | Admitting: Internal Medicine

## 2016-03-14 LAB — COLOGUARD: COLOGUARD: NEGATIVE

## 2016-03-14 LAB — CYTOLOGY - PAP

## 2016-03-19 ENCOUNTER — Encounter: Payer: Self-pay | Admitting: Internal Medicine

## 2016-04-04 LAB — COLOGUARD: COLOGUARD: NEGATIVE

## 2016-04-10 ENCOUNTER — Encounter: Payer: Self-pay | Admitting: Internal Medicine

## 2016-04-13 ENCOUNTER — Encounter: Payer: Self-pay | Admitting: Internal Medicine

## 2016-04-16 ENCOUNTER — Encounter: Payer: Self-pay | Admitting: Internal Medicine

## 2016-05-24 IMAGING — CR DG CHEST 2V
2 series · 2 of 2 positions shown · non-contrast
Comparison: [DATE]

CLINICAL DATA: Cough and congestion for 3 days, not getting better
with over the counter medications, history hypertension and smoking

EXAM:
CHEST  2 VIEW

[w chest pa]
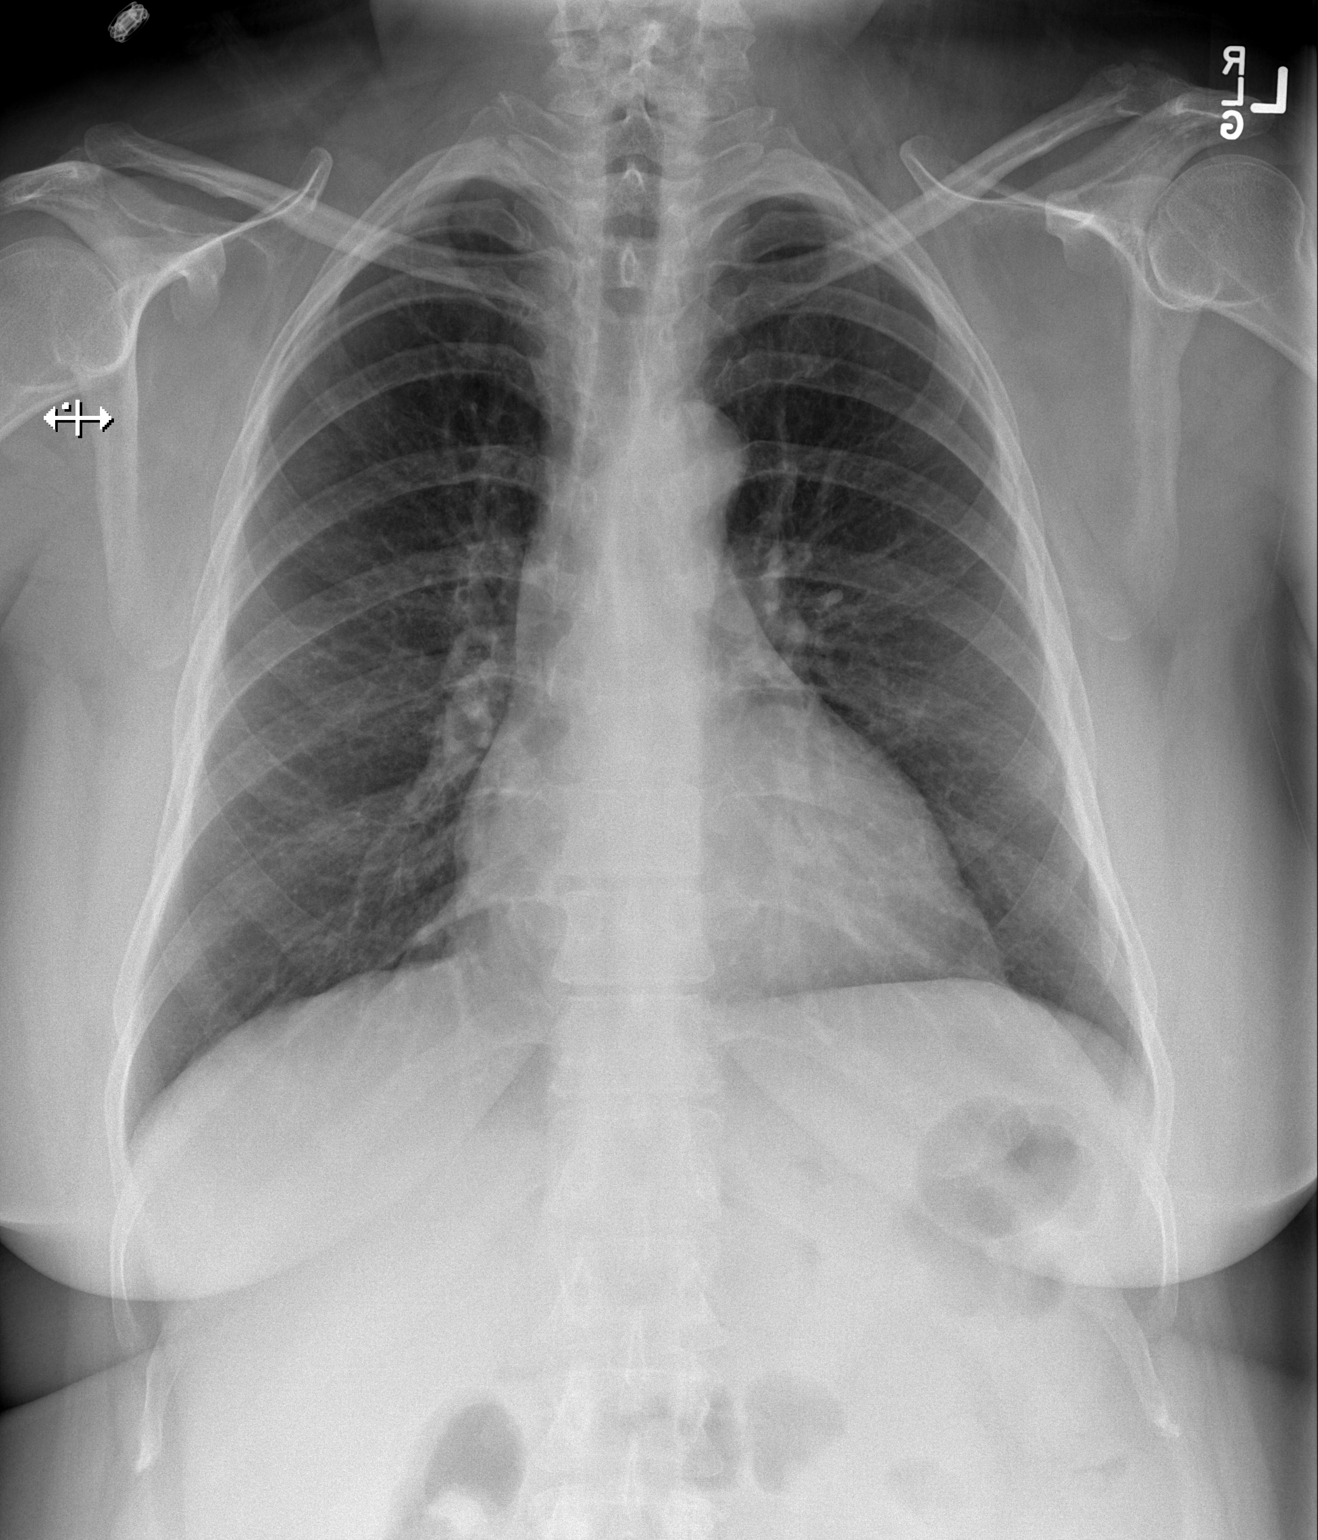

[w chest lat]
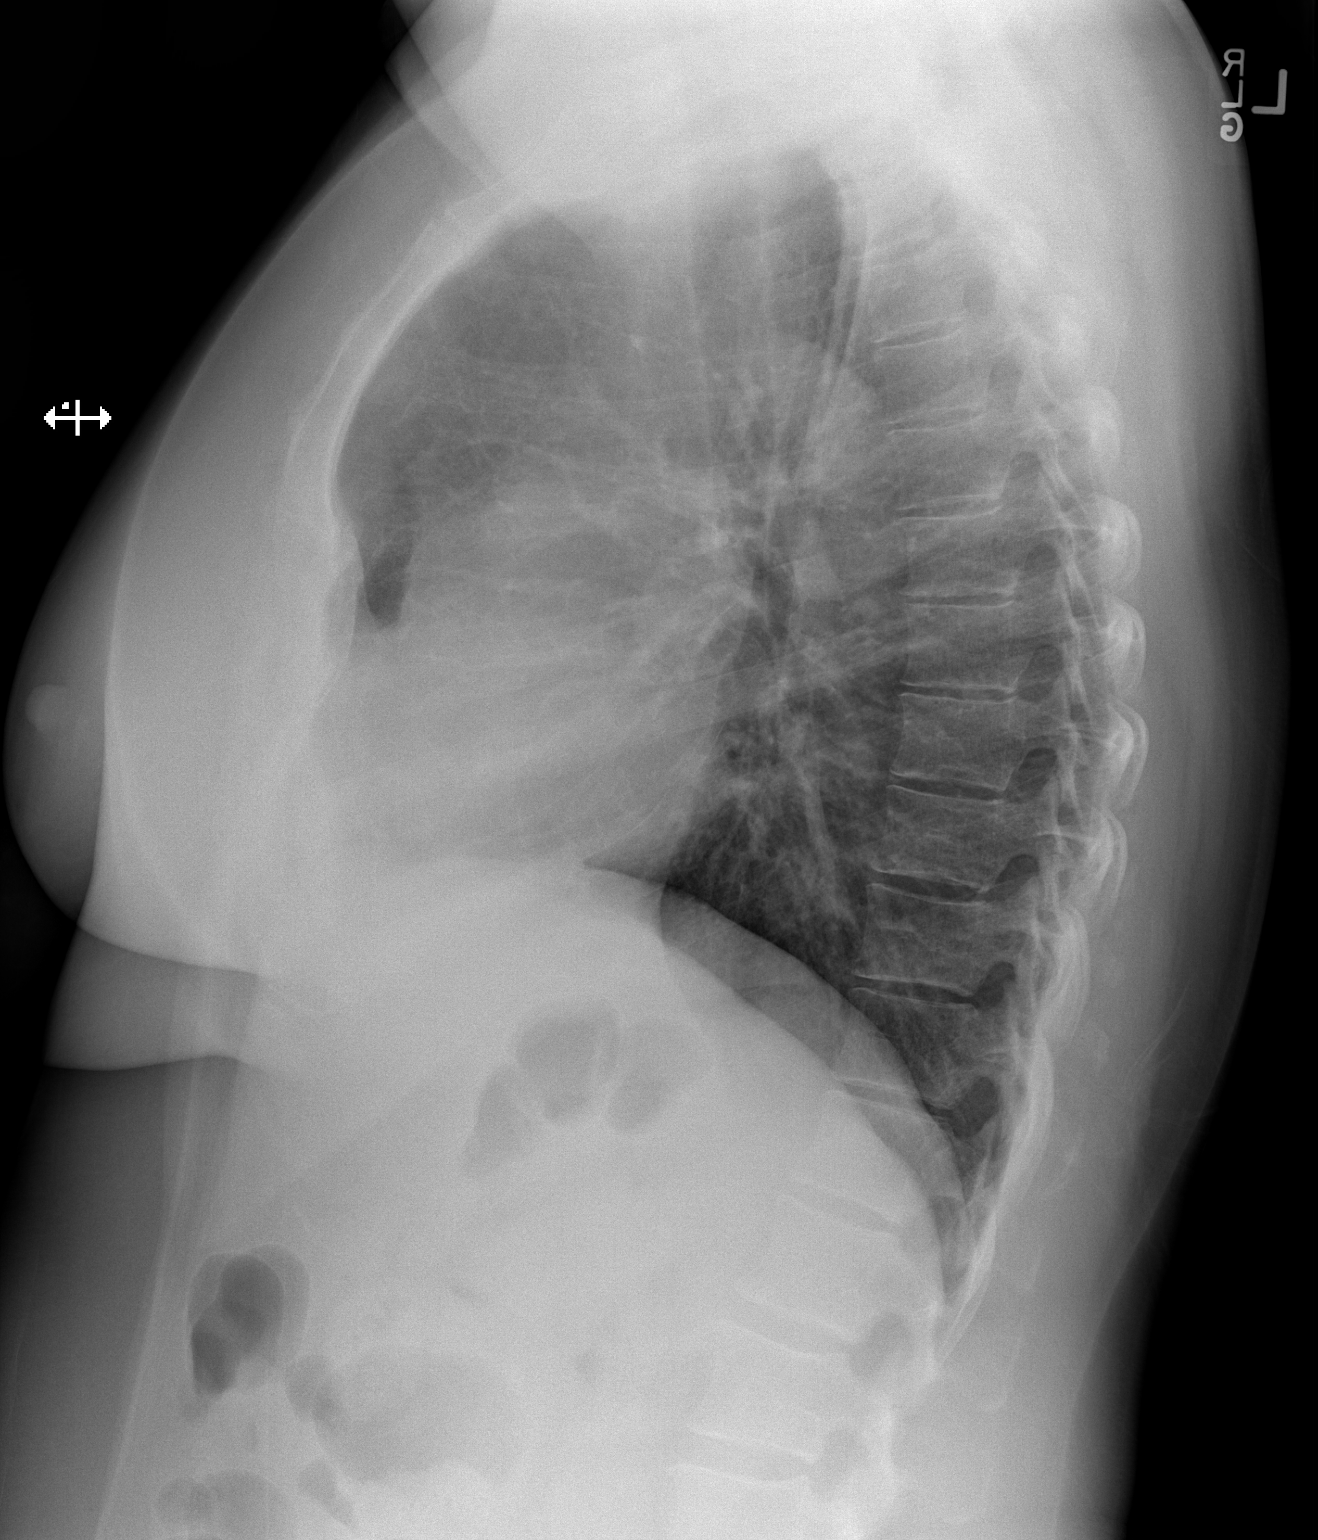

[2 of 2 positions shown; findings below may reference images not displayed]

FINDINGS: Upper normal heart size.

Mediastinal contours and pulmonary vascularity normal.

Lungs clear.

No pulmonary infiltrate, pleural effusion or pneumothorax.

Bones unremarkable.
IMPRESSION: No acute abnormalities.

## 2016-06-07 ENCOUNTER — Encounter: Payer: Self-pay | Admitting: Internal Medicine

## 2016-06-24 ENCOUNTER — Other Ambulatory Visit: Payer: Self-pay | Admitting: Internal Medicine

## 2016-08-25 ENCOUNTER — Other Ambulatory Visit: Payer: Self-pay | Admitting: Internal Medicine

## 2016-08-25 DIAGNOSIS — E876 Hypokalemia: Secondary | ICD-10-CM

## 2016-08-25 DIAGNOSIS — I10 Essential (primary) hypertension: Secondary | ICD-10-CM

## 2016-11-08 ENCOUNTER — Other Ambulatory Visit: Payer: Self-pay | Admitting: Internal Medicine

## 2016-11-22 ENCOUNTER — Other Ambulatory Visit: Payer: Self-pay | Admitting: Internal Medicine

## 2017-01-02 ENCOUNTER — Telehealth: Payer: Self-pay | Admitting: Internal Medicine

## 2017-01-02 NOTE — Telephone Encounter (Signed)
Patient states she went to pharmacy to pick up levothyroxine.  States mg changed from 25mg  to 75mg .  Pharmacy wanted patient to confirm with Dr. Ronnald Ramp this was a correct change.

## 2017-01-03 NOTE — Telephone Encounter (Signed)
Called pt and informed that dosage has always been 75 mcg. Pt stated that pharmacy told her there was a recent increase.

## 2017-02-13 ENCOUNTER — Other Ambulatory Visit: Payer: Self-pay | Admitting: Internal Medicine

## 2017-02-13 DIAGNOSIS — E876 Hypokalemia: Secondary | ICD-10-CM

## 2017-02-13 DIAGNOSIS — I10 Essential (primary) hypertension: Secondary | ICD-10-CM

## 2017-02-26 ENCOUNTER — Other Ambulatory Visit: Payer: Self-pay | Admitting: Internal Medicine

## 2017-02-26 DIAGNOSIS — E876 Hypokalemia: Secondary | ICD-10-CM

## 2017-02-26 DIAGNOSIS — I10 Essential (primary) hypertension: Secondary | ICD-10-CM

## 2017-02-27 ENCOUNTER — Telehealth: Payer: Self-pay | Admitting: *Deleted

## 2017-02-27 DIAGNOSIS — E876 Hypokalemia: Secondary | ICD-10-CM

## 2017-02-27 DIAGNOSIS — I10 Essential (primary) hypertension: Secondary | ICD-10-CM

## 2017-02-27 MED ORDER — TRIAMTERENE-HCTZ 37.5-25 MG PO CAPS: 1.0000 | ORAL_CAPSULE | Freq: Every day | ORAL | 0 refills | Status: DC

## 2017-02-27 MED ORDER — VALSARTAN 160 MG PO TABS: 160.0000 mg | ORAL_TABLET | Freq: Every day | ORAL | 0 refills | Status: DC

## 2017-02-27 NOTE — Telephone Encounter (Signed)
Pt left msg on triage stating she has her annual appt on 5/21, but she is currently out of her bp medications. Requesting refill to be sent to [facility]. Per office policy can only send 30 day until appt. Sent electronically to requested pharmacy...Johny Chess

## 2017-03-13 ENCOUNTER — Encounter: Payer: BLUE CROSS/BLUE SHIELD | Admitting: Internal Medicine

## 2017-03-18 ENCOUNTER — Encounter: Payer: Self-pay | Admitting: Internal Medicine

## 2017-03-18 ENCOUNTER — Other Ambulatory Visit (INDEPENDENT_AMBULATORY_CARE_PROVIDER_SITE_OTHER): Payer: BLUE CROSS/BLUE SHIELD

## 2017-03-18 ENCOUNTER — Ambulatory Visit (INDEPENDENT_AMBULATORY_CARE_PROVIDER_SITE_OTHER): Payer: BLUE CROSS/BLUE SHIELD | Admitting: Internal Medicine

## 2017-03-18 VITALS — BP 152/90 | HR 83 | Temp 97.5°F | Resp 16 | Ht 60.0 in | Wt 175.2 lb

## 2017-03-18 DIAGNOSIS — E876 Hypokalemia: Secondary | ICD-10-CM

## 2017-03-18 DIAGNOSIS — F322 Major depressive disorder, single episode, severe without psychotic features: Secondary | ICD-10-CM | POA: Diagnosis not present

## 2017-03-18 DIAGNOSIS — Z0000 Encounter for general adult medical examination without abnormal findings: Secondary | ICD-10-CM

## 2017-03-18 DIAGNOSIS — G8929 Other chronic pain: Secondary | ICD-10-CM

## 2017-03-18 DIAGNOSIS — R739 Hyperglycemia, unspecified: Secondary | ICD-10-CM | POA: Diagnosis not present

## 2017-03-18 DIAGNOSIS — R7989 Other specified abnormal findings of blood chemistry: Secondary | ICD-10-CM | POA: Diagnosis not present

## 2017-03-18 DIAGNOSIS — Z1231 Encounter for screening mammogram for malignant neoplasm of breast: Secondary | ICD-10-CM

## 2017-03-18 DIAGNOSIS — I10 Essential (primary) hypertension: Secondary | ICD-10-CM

## 2017-03-18 DIAGNOSIS — M25512 Pain in left shoulder: Secondary | ICD-10-CM

## 2017-03-18 DIAGNOSIS — E038 Other specified hypothyroidism: Secondary | ICD-10-CM

## 2017-03-18 LAB — CBC WITH DIFFERENTIAL/PLATELET
BASOS ABS: 0.1 10*3/uL (ref 0.0–0.1)
Basophils Relative: 0.9 % (ref 0.0–3.0)
EOS PCT: 3.4 % (ref 0.0–5.0)
Eosinophils Absolute: 0.3 10*3/uL (ref 0.0–0.7)
HCT: 41.7 % (ref 36.0–46.0)
HEMOGLOBIN: 14.1 g/dL (ref 12.0–15.0)
Lymphocytes Relative: 23.4 % (ref 12.0–46.0)
Lymphs Abs: 2 10*3/uL (ref 0.7–4.0)
MCHC: 33.7 g/dL (ref 30.0–36.0)
MCV: 85.6 fl (ref 78.0–100.0)
MONO ABS: 0.6 10*3/uL (ref 0.1–1.0)
Monocytes Relative: 6.6 % (ref 3.0–12.0)
Neutro Abs: 5.5 10*3/uL (ref 1.4–7.7)
Neutrophils Relative %: 65.7 % (ref 43.0–77.0)
Platelets: 461 10*3/uL — ABNORMAL HIGH (ref 150.0–400.0)
RBC: 4.87 Mil/uL (ref 3.87–5.11)
RDW: 13.5 % (ref 11.5–15.5)
WBC: 8.4 10*3/uL (ref 4.0–10.5)

## 2017-03-18 LAB — HEMOGLOBIN A1C: Hgb A1c MFr Bld: 6.3 % (ref 4.6–6.5)

## 2017-03-18 LAB — TSH: TSH: 1.16 u[IU]/mL (ref 0.35–4.50)

## 2017-03-18 MED ORDER — VILAZODONE HCL 10 & 20 MG PO KIT: 1.0000 | PACK | Freq: Every day | ORAL | 0 refills | Status: DC

## 2017-03-18 NOTE — Progress Notes (Signed)
Subjective:  Patient ID: Sarah Watts, female    DOB: 1966/02/02  Age: 51 y.o. MRN: [id number]  CC: Hypertension; Hypothyroidism; Depression; and Annual Exam   HPI [name] presents for a CPX.   She complains of protracted grief 7 months after the death of her father. She is having persistent episodes of anhedonia, crying spells, insomnia with difficulty falling asleep and early morning awakenings. She is taking Tylenol PM to help her sleep.   She also complains of chronic left-sided neck pain and chronic left shoulder pain with decreased range of motion the left shoulder.   She's had no recent trauma or injury. She tells me her blood pressure has been well controlled and she has had no recent episodes of headache/blurred vision/chest pain/shortness of breath/palpitations/edema.  Outpatient Medications Prior to Visit  Medication Sig Dispense Refill  . fluticasone (FLONASE) 50 MCG/ACT nasal spray USE TWO SPRAY(S) IN EACH NOSTRIL ONCE DAILY 16 g 11  . KLOR-CON M20 20 MEQ tablet     . levothyroxine (SYNTHROID, LEVOTHROID) 75 MCG tablet TAKE ONE TABLET BY MOUTH ONCE DAILY 90 tablet 1  . loratadine (CLARITIN) 10 MG tablet Take 10 mg by mouth daily.      Marland Kitchen triamterene-hydrochlorothiazide (DYAZIDE) 37.5-25 MG capsule Take 1 each (1 capsule total) by mouth daily. Must keep May 21st appt for future refills 30 capsule 0  . valsartan (DIOVAN) 160 MG tablet Take 1 tablet (160 mg total) by mouth daily. Must keep May 21st appt for future refills 30 tablet 0   No facility-administered medications prior to visit.     ROS Review of Systems  Constitutional: Positive for fatigue. Negative for activity change, appetite change, diaphoresis and unexpected weight change.  HENT: Negative.   Eyes: Negative.  Negative for visual disturbance.  Respiratory: Negative for cough, chest tightness, shortness of breath, wheezing and stridor.   Cardiovascular: Negative for chest pain, palpitations and  leg swelling.  Gastrointestinal: Negative for abdominal pain, blood in stool, constipation, diarrhea, nausea and vomiting.  Endocrine: Negative.  Negative for cold intolerance, heat intolerance, polydipsia, polyphagia and polyuria.  Genitourinary: Negative.  Negative for difficulty urinating.  Musculoskeletal: Positive for arthralgias and neck pain. Negative for back pain, gait problem, joint swelling and neck stiffness.  Skin: Negative.   Allergic/Immunologic: Negative.   Neurological: Negative.  Negative for dizziness, weakness and headaches.  Hematological: Negative for adenopathy. Does not bruise/bleed easily.  Psychiatric/Behavioral: Positive for dysphoric mood and sleep disturbance. Negative for agitation, confusion, decreased concentration, self-injury and suicidal ideas. The patient is not nervous/anxious.     Objective:  BP (!) 152/90 (BP Location: Left Arm, Patient Position: Sitting, Cuff Size: Normal)   Pulse 83   Temp 97.5 F (36.4 C) (Oral)   Resp 16   Ht 5' (1.524 m)   Wt 175 lb 4 oz (79.5 kg)   SpO2 98%   BMI 34.23 kg/m   BP Readings from Last 3 Encounters:  03/18/17 (!) 152/90  03/12/16 118/82  02/22/16 138/80    Wt Readings from Last 3 Encounters:  03/18/17 175 lb 4 oz (79.5 kg)  03/12/16 155 lb (70.3 kg)  02/22/16 158 lb (71.7 kg)    Physical Exam  Constitutional: She is oriented to person, place, and time. No distress.  HENT:  Mouth/Throat: Oropharynx is clear and moist. No oropharyngeal exudate.  Eyes: Conjunctivae are normal. Right eye exhibits no discharge. Left eye exhibits no discharge. No scleral icterus.  Neck: Normal range of motion. Neck supple.  No JVD present. No tracheal deviation present. No thyromegaly present.  Cardiovascular: Normal rate, regular rhythm, normal heart sounds and intact distal pulses.  Exam reveals no gallop and no friction rub.   No murmur heard. Pulmonary/Chest: Effort normal and breath sounds normal. No stridor. No  respiratory distress. She has no wheezes. She has no rales. She exhibits no tenderness.  Abdominal: Soft. Bowel sounds are normal. She exhibits no distension and no mass. There is no tenderness. There is no rebound and no guarding.  Musculoskeletal: Normal range of motion. She exhibits no edema, tenderness or deformity.  Lymphadenopathy:    She has no cervical adenopathy.  Neurological: She is oriented to person, place, and time.  Skin: Skin is warm and dry. No rash noted. She is not diaphoretic. No erythema. No pallor.  Psychiatric: Her speech is normal and behavior is normal. Judgment and thought content normal. Her mood appears anxious. Cognition and memory are normal. She exhibits a depressed mood.  +tearful  Vitals reviewed.   Lab Results  Component Value Date   WBC 8.4 03/18/2017   HGB 14.1 03/18/2017   HCT 41.7 03/18/2017   PLT 461.0 (H) 03/18/2017   GLUCOSE 95 03/18/2017   CHOL 197 03/18/2017   TRIG 208.0 (H) 03/18/2017   HDL 42.20 03/18/2017   LDLDIRECT 118.0 03/18/2017   LDLCALC 118 (H) 03/12/2016   ALT 22 03/18/2017   AST 19 03/18/2017   NA 137 03/18/2017   K 4.2 03/18/2017   CL 100 03/18/2017   CREATININE 0.87 03/18/2017   BUN 10 03/18/2017   CO2 28 03/18/2017   TSH 1.16 03/18/2017   HGBA1C 6.3 03/18/2017    No results found.  Assessment & Plan:   Gethsemane was seen today for hypertension, hypothyroidism, depression and annual exam.  Diagnoses and all orders for this visit:  Essential hypertension- her blood pressure is adequately well controlled, electrolytes and renal function are normal, will continue the current combination of a diuretic and ARB -     Comprehensive metabolic panel; Future -     CBC with Differential/Platelet; Future  Routine general medical examination at a health care facility- exam completed, labs ordered and reviewed, vaccines reviewed, she was referred for mammogram, will screen for colon pathology with Cologuard, she has a low  F[place]ingham risk score so I do not recommend starting a statin for cardiovascular risk reduction, patient education material was given. -     Lipid panel; Future -     HIV antibody; Future  Other specified hypothyroidism-  Her TSH is in the normal range, she will remain on the current dose of levothyroxine -     TSH; Future  Hyperglycemia- her A1c is up to 6.3%, she is prediabetic, medications are not needed to treat this at this time, she agrees to work on her lifestyle modifications. -     Hemoglobin A1c; Future  Visit for screening mammogram -     MM DIGITAL SCREENING BILATERAL; Future  Current severe episode of major depressive disorder without psychotic features without prior episode ([place])- will start VIIBRYD, she was given a starter pack and in the next 2 weeks we'll decide whether or not she stays of the 20 mg dose or increases to the 40 mg dose. -     Vilazodone HCl (VIIBRYD STARTER PACK) 10 & 20 MG KIT; Take 1 tablet by mouth daily.  Chronic left shoulder pain -     Ambulatory referral to Sports Medicine   I am having Ms.  Abruzzo start on Vilazodone HCl. I am also having her maintain her loratadine, KLOR-CON M20, fluticasone, levothyroxine, valsartan, and triamterene-hydrochlorothiazide.  Meds ordered this encounter  Medications  . Vilazodone HCl (VIIBRYD STARTER PACK) 10 & 20 MG KIT    Sig: Take 1 tablet by mouth daily.    Dispense:  1 kit    Refill:  0     Follow-up: Return in about 2 weeks (around 04/01/2017).  Scarlette Calico, MD

## 2017-03-18 NOTE — Patient Instructions (Signed)
Major Depressive Disorder, Adult Major depressive disorder (MDD) is a mental health condition. It may also be called clinical depression or unipolar depression. MDD usually causes feelings of sadness, hopelessness, or helplessness. MDD can also cause physical symptoms. It can interfere with work, school, relationships, and other everyday activities. MDD may be mild, moderate, or severe. It may occur once (single episode major depressive disorder) or it may occur multiple times (recurrent major depressive disorder). What are the causes? The exact cause of this condition is not known. MDD is most likely caused by a combination of things, which may include:  Genetic factors. These are traits that are passed along from parent to child.  Individual factors. Your personality, your behavior, and the way you handle your thoughts and feelings may contribute to MDD. This includes personality traits and behaviors learned from others.  Physical factors, such as: ? Differences in the part of your brain that controls emotion. This part of your brain may be different than it is in people who do not have MDD. ? Long-term (chronic) medical or psychiatric illnesses.  Social factors. Traumatic experiences or major life changes may play a role in the development of MDD.  What increases the risk? This condition is more likely to develop in women. The following factors may also make you more likely to develop MDD:  A family history of depression.  Troubled family relationships.  Abnormally low levels of certain brain chemicals.  Traumatic events in childhood, especially abuse or the loss of a parent.  Being under a lot of stress, or long-term stress, especially from upsetting life experiences or losses.  A history of: ? Chronic physical illness. ? Other mental health disorders. ? Substance abuse.  Poor living conditions.  Experiencing social exclusion or discrimination on a regular basis.  What are  the signs or symptoms? The main symptoms of MDD typically include:  Constant depressed or irritable mood.  Loss of interest in things and activities.  MDD symptoms may also include:  Sleeping or eating too much or too little.  Unexplained weight change.  Fatigue or low energy.  Feelings of worthlessness or guilt.  Difficulty thinking clearly or making decisions.  Thoughts of suicide or of harming others.  Physical agitation or weakness.  Isolation.  Severe cases of MDD may also occur with other symptoms, such as:  Delusions or hallucinations, in which you imagine things that are not real (psychotic depression).  Low-level depression that lasts at least a year (chronic depression or persistent depressive disorder).  Extreme sadness and hopelessness (melancholic depression).  Trouble speaking and moving (catatonic depression).  How is this diagnosed? This condition may be diagnosed based on:  Your symptoms.  Your medical history, including your mental health history. This may involve tests to evaluate your mental health. You may be asked questions about your lifestyle, including any drug and alcohol use, and how long you have had symptoms of MDD.  A physical exam.  Blood tests to rule out other conditions.  You must have a depressed mood and at least four other MDD symptoms most of the day, nearly every day in the same 2-week timeframe before your health care provider can confirm a diagnosis of MDD. How is this treated? This condition is usually treated by mental health professionals, such as psychologists, psychiatrists, and clinical social workers. You may need more than one type of treatment. Treatment may include:  Psychotherapy. This is also called talk therapy or counseling. Types of psychotherapy include: ? Cognitive behavioral   therapy (CBT). This type of therapy teaches you to recognize unhealthy feelings, thoughts, and behaviors, and replace them with  positive thoughts and actions. ? Interpersonal therapy (IPT). This helps you to improve the way you relate to and communicate with others. ? Family therapy. This treatment includes members of your family.  Medicine to treat anxiety and depression, or to help you control certain emotions and behaviors.  Lifestyle changes, such as: ? Limiting alcohol and drug use. ? Exercising regularly. ? Getting plenty of sleep. ? Making healthy eating choices. ? Spending more time outdoors.  Treatments involving stimulation of the brain can be used in situations with extremely severe symptoms, or when medicine or other therapies do not work over time. These treatments include electroconvulsive therapy, transcranial magnetic stimulation, and vagal nerve stimulation. Follow these instructions at home: Activity  Return to your normal activities as told by your health care provider.  Exercise regularly and spend time outdoors as told by your health care provider. General instructions  Take over-the-counter and prescription medicines only as told by your health care provider.  Do not drink alcohol. If you drink alcohol, limit your alcohol intake to no more than 1 drink a day for nonpregnant women and 2 drinks a day for men. One drink equals 12 oz of beer, 5 oz of wine, or 1 oz of hard liquor. Alcohol can affect any antidepressant medicines you are taking. Talk to your health care provider about your alcohol use.  Eat a healthy diet and get plenty of sleep.  Find activities that you enjoy doing, and make time to do them.  Consider joining a support group. Your health care provider may be able to recommend a support group.  Keep all follow-up visits as told by your health care provider. This is important. Where to find more information: National Alliance on Mental Illness  www.nami.org  U.S. National Institute of Mental Health  www.nimh.nih.gov  National Suicide Prevention  Lifeline  1-800-273-TALK (8255). This is free, 24-hour help.  Contact a health care provider if:  Your symptoms get worse.  You develop new symptoms. Get help right away if:  You self-harm.  You have serious thoughts about hurting yourself or others.  You see, hear, taste, smell, or feel things that are not present (hallucinate). This information is not intended to replace advice given to you by your health care provider. Make sure you discuss any questions you have with your health care provider. Document Released: 02/09/2013 Document Revised: 06/21/2016 Document Reviewed: 04/25/2016 Elsevier Interactive Patient Education  2017 Elsevier Inc.  

## 2017-03-19 LAB — LDL CHOLESTEROL, DIRECT: LDL DIRECT: 118 mg/dL

## 2017-03-19 LAB — COMPREHENSIVE METABOLIC PANEL
ALBUMIN: 4.4 g/dL (ref 3.5–5.2)
ALT: 22 U/L (ref 0–35)
AST: 19 U/L (ref 0–37)
Alkaline Phosphatase: 72 U/L (ref 39–117)
BILIRUBIN TOTAL: 0.3 mg/dL (ref 0.2–1.2)
BUN: 10 mg/dL (ref 6–23)
CO2: 28 mEq/L (ref 19–32)
Calcium: 10.1 mg/dL (ref 8.4–10.5)
Chloride: 100 mEq/L (ref 96–112)
Creatinine, Ser: 0.87 mg/dL (ref 0.40–1.20)
GFR: 72.65 mL/min (ref >60.00)
Glucose, Bld: 95 mg/dL (ref 70–99)
POTASSIUM: 4.2 meq/L (ref 3.5–5.1)
SODIUM: 137 meq/L (ref 135–145)
TOTAL PROTEIN: 7.3 g/dL (ref 6.0–8.3)

## 2017-03-19 LAB — LIPID PANEL
Cholesterol: 197 mg/dL (ref 0–200)
HDL: 42.2 mg/dL (ref >39.00)
NonHDL: 155.15
TRIGLYCERIDES: 208 mg/dL — AB (ref 0.0–149.0)
Total CHOL/HDL Ratio: 5
VLDL: 41.6 mg/dL — ABNORMAL HIGH (ref 0.0–40.0)

## 2017-03-19 LAB — HIV ANTIBODY (ROUTINE TESTING W REFLEX): HIV 1&2 Ab, 4th Generation: NONREACTIVE

## 2017-03-19 MED ORDER — TRIAMTERENE-HCTZ 37.5-25 MG PO CAPS: 1.0000 | ORAL_CAPSULE | Freq: Every day | ORAL | 1 refills | Status: DC

## 2017-03-19 MED ORDER — VALSARTAN 160 MG PO TABS: 160.0000 mg | ORAL_TABLET | Freq: Every day | ORAL | 1 refills | Status: DC

## 2017-03-20 ENCOUNTER — Encounter: Payer: Self-pay | Admitting: Internal Medicine

## 2017-03-29 ENCOUNTER — Telehealth: Payer: Self-pay | Admitting: Internal Medicine

## 2017-03-29 ENCOUNTER — Other Ambulatory Visit: Payer: Self-pay | Admitting: Internal Medicine

## 2017-03-29 DIAGNOSIS — F322 Major depressive disorder, single episode, severe without psychotic features: Secondary | ICD-10-CM

## 2017-03-29 MED ORDER — VILAZODONE HCL 20 MG PO TABS: 1.0000 | ORAL_TABLET | Freq: Every day | ORAL | 1 refills | Status: DC

## 2017-03-29 NOTE — Telephone Encounter (Signed)
RX sent

## 2017-03-29 NOTE — Telephone Encounter (Signed)
Routing to dr jones, please advise, thanks 

## 2017-03-29 NOTE — Telephone Encounter (Signed)
Pt states she has only 3 pills left of  the samples Dr. Ronnald Ramp gave her for her depression. She states they are working fine and is wondering if Dr. Ronnald Ramp will put a prescription in for her at [facility] on [address] in [place]. She does have an upcoming appointment on the 7th

## 2017-04-01 ENCOUNTER — Ambulatory Visit
Admission: RE | Admit: 2017-04-01 | Discharge: 2017-04-01 | Disposition: A | Discharge status: Home / Self Care | Payer: BLUE CROSS/BLUE SHIELD | Source: Ambulatory Visit | Attending: Internal Medicine | Admitting: Internal Medicine

## 2017-04-01 DIAGNOSIS — Z1231 Encounter for screening mammogram for malignant neoplasm of breast: Secondary | ICD-10-CM

## 2017-04-02 LAB — HM MAMMOGRAPHY

## 2017-04-04 ENCOUNTER — Ambulatory Visit (INDEPENDENT_AMBULATORY_CARE_PROVIDER_SITE_OTHER): Payer: BLUE CROSS/BLUE SHIELD

## 2017-04-04 ENCOUNTER — Ambulatory Visit (INDEPENDENT_AMBULATORY_CARE_PROVIDER_SITE_OTHER): Payer: BLUE CROSS/BLUE SHIELD | Admitting: Internal Medicine

## 2017-04-04 ENCOUNTER — Other Ambulatory Visit (INDEPENDENT_AMBULATORY_CARE_PROVIDER_SITE_OTHER): Payer: BLUE CROSS/BLUE SHIELD

## 2017-04-04 ENCOUNTER — Encounter: Payer: Self-pay | Admitting: Internal Medicine

## 2017-04-04 VITALS — BP 128/80 | HR 79 | Temp 97.5°F | Resp 16 | Ht 60.0 in | Wt 172.8 lb

## 2017-04-04 DIAGNOSIS — F322 Major depressive disorder, single episode, severe without psychotic features: Secondary | ICD-10-CM

## 2017-04-04 DIAGNOSIS — D75839 Thrombocytosis, unspecified: Secondary | ICD-10-CM | POA: Insufficient documentation

## 2017-04-04 DIAGNOSIS — E538 Deficiency of other specified B group vitamins: Secondary | ICD-10-CM | POA: Diagnosis not present

## 2017-04-04 DIAGNOSIS — D473 Essential (hemorrhagic) thrombocythemia: Secondary | ICD-10-CM

## 2017-04-04 DIAGNOSIS — I10 Essential (primary) hypertension: Secondary | ICD-10-CM

## 2017-04-04 LAB — CBC WITH DIFFERENTIAL/PLATELET
BASOS PCT: 0.7 % (ref 0.0–3.0)
Basophils Absolute: 0.1 10*3/uL (ref 0.0–0.1)
EOS ABS: 0.4 10*3/uL (ref 0.0–0.7)
Eosinophils Relative: 3.6 % (ref 0.0–5.0)
HCT: 42 % (ref 36.0–46.0)
Hemoglobin: 14.3 g/dL (ref 12.0–15.0)
Lymphocytes Relative: 29 % (ref 12.0–46.0)
Lymphs Abs: 3 10*3/uL (ref 0.7–4.0)
MCHC: 34 g/dL (ref 30.0–36.0)
MCV: 85.6 fl (ref 78.0–100.0)
MONO ABS: 0.9 10*3/uL (ref 0.1–1.0)
Monocytes Relative: 9.2 % (ref 3.0–12.0)
NEUTROS ABS: 5.9 10*3/uL (ref 1.4–7.7)
Neutrophils Relative %: 57.5 % (ref 43.0–77.0)
Platelets: 460 10*3/uL — ABNORMAL HIGH (ref 150.0–400.0)
RBC: 4.91 Mil/uL (ref 3.87–5.11)
RDW: 13.4 % (ref 11.5–15.5)
WBC: 10.2 10*3/uL (ref 4.0–10.5)

## 2017-04-04 LAB — VITAMIN B12: VITAMIN B 12: 133 pg/mL — AB (ref 211–911)

## 2017-04-04 MED ORDER — CYANOCOBALAMIN 1000 MCG/ML IJ SOLN
1000.0000 ug | Freq: Once | INTRAMUSCULAR | Status: AC
  Administered 2017-04-04: 1000 ug via INTRAMUSCULAR

## 2017-04-04 NOTE — Progress Notes (Signed)
Subjective:  Patient ID: Sarah Watts, female    DOB: 11/04/1965  Age: 51 y.o. MRN: [id number]  CC: Hypertension and Depression   HPI HALAH WHITESIDE presents for f/up - She has quit taking the VIIBRYD. She took it for a week or 2 and got better and now says that her symptoms have resolved and she doesn't want to keep taking it. She thinks her depression a few weeks ago was triggered by her father's birthday and she is not having those symptoms anymore. She tells me her blood pressure has been well controlled. Her recent lab work showed that she had an elevated platelet count.  Outpatient Medications Prior to Visit  Medication Sig Dispense Refill  . fluticasone (FLONASE) 50 MCG/ACT nasal spray USE TWO SPRAY(S) IN EACH NOSTRIL ONCE DAILY 16 g 11  . KLOR-CON M20 20 MEQ tablet     . levothyroxine (SYNTHROID, LEVOTHROID) 75 MCG tablet TAKE ONE TABLET BY MOUTH ONCE DAILY 90 tablet 1  . loratadine (CLARITIN) 10 MG tablet Take 10 mg by mouth daily.      Marland Kitchen triamterene-hydrochlorothiazide (DYAZIDE) 37.5-25 MG capsule Take 1 each (1 capsule total) by mouth daily. 90 capsule 1  . valsartan (DIOVAN) 160 MG tablet Take 1 tablet (160 mg total) by mouth daily. Must keep May 21st appt for future refills 90 tablet 1  . Vilazodone HCl (VIIBRYD) 20 MG TABS Take 1 tablet (20 mg total) by mouth daily. 90 tablet 1   No facility-administered medications prior to visit.     ROS Review of Systems  Constitutional: Negative.  Negative for appetite change, chills, diaphoresis, fatigue and unexpected weight change.  HENT: Negative.  Negative for sore throat and trouble swallowing.   Eyes: Negative for visual disturbance.  Respiratory: Negative for cough, chest tightness, shortness of breath and wheezing.   Cardiovascular: Negative.  Negative for chest pain, palpitations and leg swelling.  Gastrointestinal: Negative.  Negative for abdominal pain, diarrhea, nausea and vomiting.  Endocrine: Negative.     Genitourinary: Negative.  Negative for difficulty urinating, vaginal bleeding and vaginal discharge.  Musculoskeletal: Negative.  Negative for myalgias.  Neurological: Negative.  Negative for dizziness, weakness and numbness.  Hematological: Negative for adenopathy. Does not bruise/bleed easily.  Psychiatric/Behavioral: Negative.  Negative for dysphoric mood, sleep disturbance and suicidal ideas. The patient is not nervous/anxious.     Objective:  BP 128/80 (BP Location: Left Arm, Patient Position: Sitting, Cuff Size: Normal)   Pulse 79   Temp 97.5 F (36.4 C) (Oral)   Resp 16   Ht 5' (1.524 m)   Wt 172 lb 12 oz (78.4 kg)   SpO2 100%   BMI 33.74 kg/m   BP Readings from Last 3 Encounters:  04/04/17 128/80  03/18/17 (!) 152/90  03/12/16 118/82    Wt Readings from Last 3 Encounters:  04/04/17 172 lb 12 oz (78.4 kg)  03/18/17 175 lb 4 oz (79.5 kg)  03/12/16 155 lb (70.3 kg)    Physical Exam  Constitutional: She is oriented to person, place, and time. No distress.  HENT:  Mouth/Throat: Oropharynx is clear and moist. No oropharyngeal exudate.  Eyes: Conjunctivae are normal. Right eye exhibits no discharge. Left eye exhibits no discharge. No scleral icterus.  Neck: Normal range of motion. Neck supple. No JVD present. No thyromegaly present.  Cardiovascular: Normal rate and regular rhythm.  Exam reveals no gallop.   No murmur heard. Pulmonary/Chest: Effort normal and breath sounds normal. No respiratory distress. She has no  wheezes. She has no rales. She exhibits no tenderness.  Abdominal: Soft. Bowel sounds are normal. She exhibits no distension and no mass. There is no tenderness. There is no rebound and no guarding.  Musculoskeletal: Normal range of motion. She exhibits no edema, tenderness or deformity.  Neurological: She is alert and oriented to person, place, and time. She has normal reflexes.  Skin: Skin is warm and dry. No rash noted. She is not diaphoretic. No erythema.  No pallor.  Vitals reviewed.   Lab Results  Component Value Date   WBC 10.2 04/04/2017   HGB 14.3 04/04/2017   HCT 42.0 04/04/2017   PLT 460.0 (H) 04/04/2017   GLUCOSE 95 03/18/2017   CHOL 197 03/18/2017   TRIG 208.0 (H) 03/18/2017   HDL 42.20 03/18/2017   LDLDIRECT 118.0 03/18/2017   LDLCALC 118 (H) 03/12/2016   ALT 22 03/18/2017   AST 19 03/18/2017   NA 137 03/18/2017   K 4.2 03/18/2017   CL 100 03/18/2017   CREATININE 0.87 03/18/2017   BUN 10 03/18/2017   CO2 28 03/18/2017   TSH 1.16 03/18/2017   HGBA1C 6.3 03/18/2017    Mm Digital Screening Bilateral  Result Date: 04/02/2017 CLINICAL DATA:  Screening. EXAM: DIGITAL SCREENING BILATERAL MAMMOGRAM WITH CAD COMPARISON:  Previous exam(s). ACR Breast Density Category b: There are scattered areas of fibroglandular density. FINDINGS: There are no findings suspicious for malignancy. Images were processed with CAD. IMPRESSION: No mammographic evidence of malignancy. A result letter of this screening mammogram will be mailed directly to the patient. RECOMMENDATION: Screening mammogram in one year. (Code:SM-B-01Y) BI-RADS CATEGORY  1: Negative. Electronically Signed   By: Everlean Alstrom M.D.   On: 04/02/2017 08:15    Assessment & Plan:   Fiora was seen today for hypertension and depression.  Diagnoses and all orders for this visit:  Essential hypertension- her blood pressure is well-controlled  Current severe episode of major depressive disorder without psychotic features without prior episode- she will let me know if she wants to start an antidepressant again ([place])  Thrombocytosis ([place])- her platelet count remains elevated, she has no symptoms consistent with hyper or hypocoagulability, her other cell lines are normal and she is not anemic, will check an SPEP to screen for lymphoproliferative disease, she does have B12 deficiency so will treat that as well to see if this normalizes the platelet count. -     CBC with  Differential/Platelet; Future -     Vitamin B12; Future -     Protein electrophoresis, serum; Future  B12 nutritional deficiency- will start parenteral B12 replacement therapy, a monthly injection indefinitely.   I have discontinued Ms. Papaleo's Vilazodone HCl. I am also having her maintain her loratadine, KLOR-CON M20, fluticasone, levothyroxine, valsartan, and triamterene-hydrochlorothiazide.  No orders of the defined types were placed in this encounter.    Follow-up: Return in about 6 months (around 10/04/2017).  Scarlette Calico, MD

## 2017-04-04 NOTE — Patient Instructions (Signed)

## 2017-04-09 LAB — PROTEIN ELECTROPHORESIS, SERUM
Albumin ELP: 4.2 g/dL (ref 3.8–4.8)
Alpha-1-Globulin: 0.3 g/dL (ref 0.2–0.3)
Alpha-2-Globulin: 0.8 g/dL (ref 0.5–0.9)
BETA GLOBULIN: 0.5 g/dL (ref 0.4–0.6)
Beta 2: 0.4 g/dL (ref 0.2–0.5)
GAMMA GLOBULIN: 0.9 g/dL (ref 0.8–1.7)
Total Protein, Serum Electrophoresis: 7 g/dL (ref 6.1–8.1)

## 2017-04-23 ENCOUNTER — Encounter: Payer: Self-pay | Admitting: Family Medicine

## 2017-04-23 ENCOUNTER — Ambulatory Visit (INDEPENDENT_AMBULATORY_CARE_PROVIDER_SITE_OTHER): Payer: BLUE CROSS/BLUE SHIELD | Admitting: Family Medicine

## 2017-04-23 ENCOUNTER — Ambulatory Visit: Payer: Self-pay

## 2017-04-23 VITALS — BP 140/84 | HR 75 | Ht 60.0 in | Wt 170.0 lb

## 2017-04-23 DIAGNOSIS — M79602 Pain in left arm: Secondary | ICD-10-CM

## 2017-04-23 DIAGNOSIS — M19012 Primary osteoarthritis, left shoulder: Secondary | ICD-10-CM

## 2017-04-23 DIAGNOSIS — M75112 Incomplete rotator cuff tear or rupture of left shoulder, not specified as traumatic: Secondary | ICD-10-CM | POA: Diagnosis not present

## 2017-04-23 DIAGNOSIS — M75110 Incomplete rotator cuff tear or rupture of unspecified shoulder, not specified as traumatic: Secondary | ICD-10-CM | POA: Insufficient documentation

## 2017-04-23 DIAGNOSIS — M4802 Spinal stenosis, cervical region: Secondary | ICD-10-CM | POA: Diagnosis not present

## 2017-04-23 DIAGNOSIS — M19019 Primary osteoarthritis, unspecified shoulder: Secondary | ICD-10-CM | POA: Insufficient documentation

## 2017-04-23 MED ORDER — DICLOFENAC SODIUM 2 % TD SOLN: 2.0000 "application " | Freq: Two times a day (BID) | TRANSDERMAL | 3 refills | Status: DC

## 2017-04-23 MED ORDER — GABAPENTIN 100 MG PO CAPS: 200.0000 mg | ORAL_CAPSULE | Freq: Every day | ORAL | 3 refills | Status: DC

## 2017-04-23 MED ORDER — VITAMIN D (ERGOCALCIFEROL) 1.25 MG (50000 UNIT) PO CAPS: 50000.0000 [IU] | ORAL_CAPSULE | ORAL | 0 refills | Status: DC

## 2017-04-23 NOTE — Assessment & Plan Note (Signed)
Patient does have more arthritis of this. We discussed icing regimen and home exercises. We discussed which activities to do a which was to avoid. Patient will increase activity as tolerated. Patient come back and see me again in 4 weeks.

## 2017-04-23 NOTE — Assessment & Plan Note (Signed)
Patient also has some spinal stenosis may be some radicular symptoms. Started on gabapentin.

## 2017-04-23 NOTE — Assessment & Plan Note (Signed)
Patient given home exercises, we discussed different medications per protocol. Patient will come back and see me again in 4 weeks
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
## 2017-04-23 NOTE — Patient Instructions (Addendum)
Good to see you  Ice 20 minutes 2 times daily. Usually after activity and before bed. Exercises 3 times a week.  pennsaid pinkie amount topically 2 times daily as needed.  Gabapentin 200mg  at night Once weekly vitamin D for 12 weeks.  Try to keep hands within peripheral vision  Over the counter get tart cherry extract at night See me again in 4 weeks.

## 2017-05-23 ENCOUNTER — Ambulatory Visit: Payer: BLUE CROSS/BLUE SHIELD | Admitting: Family Medicine

## 2017-06-17 ENCOUNTER — Other Ambulatory Visit: Payer: Self-pay | Admitting: Internal Medicine

## 2017-06-17 ENCOUNTER — Other Ambulatory Visit: Payer: Self-pay | Admitting: Family Medicine

## 2017-06-17 NOTE — Telephone Encounter (Signed)
Refill done.  

## 2017-06-18 ENCOUNTER — Other Ambulatory Visit: Payer: Self-pay | Admitting: Internal Medicine

## 2017-06-18 ENCOUNTER — Telehealth: Payer: Self-pay

## 2017-06-18 DIAGNOSIS — I10 Essential (primary) hypertension: Secondary | ICD-10-CM

## 2017-06-18 MED ORDER — IRBESARTAN 150 MG PO TABS: 150.0000 mg | ORAL_TABLET | Freq: Every day | ORAL | 1 refills | Status: DC

## 2017-06-18 NOTE — Telephone Encounter (Signed)
RX changed 

## 2017-06-18 NOTE — Telephone Encounter (Signed)
[facility] faxed a request for an alternative to valsartan. Stated it is on back order.

## 2017-06-19 NOTE — Telephone Encounter (Signed)
LVM for pt to call back as soon as possible.    RE: left detailed message.

## 2017-07-04 ENCOUNTER — Other Ambulatory Visit: Payer: Self-pay | Admitting: Internal Medicine

## 2017-07-04 DIAGNOSIS — I10 Essential (primary) hypertension: Secondary | ICD-10-CM

## 2017-09-12 ENCOUNTER — Other Ambulatory Visit: Payer: Self-pay | Admitting: Internal Medicine

## 2017-09-12 DIAGNOSIS — I10 Essential (primary) hypertension: Secondary | ICD-10-CM

## 2017-09-12 DIAGNOSIS — E876 Hypokalemia: Secondary | ICD-10-CM

## 2017-10-07 ENCOUNTER — Ambulatory Visit: Payer: BLUE CROSS/BLUE SHIELD | Admitting: Internal Medicine

## 2017-12-20 ENCOUNTER — Other Ambulatory Visit: Payer: Self-pay | Admitting: Internal Medicine

## 2017-12-20 DIAGNOSIS — I10 Essential (primary) hypertension: Secondary | ICD-10-CM

## 2017-12-24 ENCOUNTER — Other Ambulatory Visit: Payer: Self-pay | Admitting: Internal Medicine

## 2017-12-24 DIAGNOSIS — E876 Hypokalemia: Secondary | ICD-10-CM

## 2017-12-24 DIAGNOSIS — I10 Essential (primary) hypertension: Secondary | ICD-10-CM

## 2017-12-25 ENCOUNTER — Telehealth: Payer: Self-pay | Admitting: Internal Medicine

## 2017-12-25 DIAGNOSIS — E876 Hypokalemia: Secondary | ICD-10-CM

## 2017-12-25 DIAGNOSIS — I10 Essential (primary) hypertension: Secondary | ICD-10-CM

## 2017-12-25 NOTE — Telephone Encounter (Signed)
Will you let pt know that she needs an appt scheduled before I can send refills.

## 2017-12-26 MED ORDER — LEVOTHYROXINE SODIUM 75 MCG PO TABS: 75.0000 ug | ORAL_TABLET | Freq: Every day | ORAL | 0 refills | Status: DC

## 2017-12-26 MED ORDER — TRIAMTERENE-HCTZ 37.5-25 MG PO CAPS: 1.0000 | ORAL_CAPSULE | Freq: Every day | ORAL | 0 refills | Status: DC

## 2017-12-26 MED ORDER — IRBESARTAN 150 MG PO TABS: 150.0000 mg | ORAL_TABLET | Freq: Every day | ORAL | 0 refills | Status: DC

## 2017-12-26 NOTE — Telephone Encounter (Signed)
LVM informing patient.

## 2017-12-26 NOTE — Telephone Encounter (Signed)
erx sent in for 30day with no refills.

## 2017-12-26 NOTE — Telephone Encounter (Signed)
Pt did sch appt for 12/30/17 @ 9:30 with Dr. Ronnald Ramp.  She is out of her meds and would like to know if she can get enough to hold her until her appt.    [place] [phone] (Phone) [phone] (Fax)

## 2017-12-30 ENCOUNTER — Encounter: Payer: Self-pay | Admitting: Internal Medicine

## 2017-12-30 ENCOUNTER — Other Ambulatory Visit (INDEPENDENT_AMBULATORY_CARE_PROVIDER_SITE_OTHER): Payer: BLUE CROSS/BLUE SHIELD

## 2017-12-30 ENCOUNTER — Ambulatory Visit (INDEPENDENT_AMBULATORY_CARE_PROVIDER_SITE_OTHER): Payer: BLUE CROSS/BLUE SHIELD | Admitting: Internal Medicine

## 2017-12-30 VITALS — BP 166/92 | HR 68 | Temp 97.6°F | Resp 16 | Ht 60.0 in | Wt 177.1 lb

## 2017-12-30 DIAGNOSIS — E039 Hypothyroidism, unspecified: Secondary | ICD-10-CM

## 2017-12-30 DIAGNOSIS — D75839 Thrombocytosis, unspecified: Secondary | ICD-10-CM

## 2017-12-30 DIAGNOSIS — I10 Essential (primary) hypertension: Secondary | ICD-10-CM

## 2017-12-30 DIAGNOSIS — D473 Essential (hemorrhagic) thrombocythemia: Secondary | ICD-10-CM | POA: Diagnosis not present

## 2017-12-30 DIAGNOSIS — E538 Deficiency of other specified B group vitamins: Secondary | ICD-10-CM

## 2017-12-30 DIAGNOSIS — R7303 Prediabetes: Secondary | ICD-10-CM

## 2017-12-30 LAB — BASIC METABOLIC PANEL
BUN: 13 mg/dL (ref 6–23)
CO2: 33 meq/L — AB (ref 19–32)
CREATININE: 1.01 mg/dL (ref 0.40–1.20)
Calcium: 10.1 mg/dL (ref 8.4–10.5)
Chloride: 100 mEq/L (ref 96–112)
GFR: 60.97 mL/min (ref >60.00)
Glucose, Bld: 100 mg/dL — ABNORMAL HIGH (ref 70–99)
Potassium: 3.9 mEq/L (ref 3.5–5.1)
Sodium: 140 mEq/L (ref 135–145)

## 2017-12-30 LAB — HEMOGLOBIN A1C: Hgb A1c MFr Bld: 6.1 % (ref 4.6–6.5)

## 2017-12-30 LAB — CBC WITH DIFFERENTIAL/PLATELET
Basophils Absolute: 0.1 10*3/uL (ref 0.0–0.1)
Basophils Relative: 1.1 % (ref 0.0–3.0)
EOS ABS: 0.3 10*3/uL (ref 0.0–0.7)
Eosinophils Relative: 3.2 % (ref 0.0–5.0)
HCT: 41.5 % (ref 36.0–46.0)
Hemoglobin: 14.1 g/dL (ref 12.0–15.0)
Lymphocytes Relative: 28.3 % (ref 12.0–46.0)
Lymphs Abs: 2.6 10*3/uL (ref 0.7–4.0)
MCHC: 34 g/dL (ref 30.0–36.0)
MCV: 86.2 fl (ref 78.0–100.0)
MONO ABS: 0.6 10*3/uL (ref 0.1–1.0)
Monocytes Relative: 7.1 % (ref 3.0–12.0)
NEUTROS ABS: 5.5 10*3/uL (ref 1.4–7.7)
NEUTROS PCT: 60.3 % (ref 43.0–77.0)
PLATELETS: 433 10*3/uL — AB (ref 150.0–400.0)
RBC: 4.81 Mil/uL (ref 3.87–5.11)
RDW: 13.2 % (ref 11.5–15.5)
WBC: 9.1 10*3/uL (ref 4.0–10.5)

## 2017-12-30 LAB — VITAMIN B12: Vitamin B-12: 79 pg/mL — ABNORMAL LOW (ref 211–911)

## 2017-12-30 LAB — TSH: TSH: 2.84 u[IU]/mL (ref 0.35–4.50)

## 2017-12-30 MED ORDER — LEVOTHYROXINE SODIUM 75 MCG PO TABS: 75.0000 ug | ORAL_TABLET | Freq: Every day | ORAL | 0 refills | Status: DC

## 2017-12-30 MED ORDER — AZILSARTAN MEDOXOMIL 80 MG PO TABS: 1.0000 | ORAL_TABLET | Freq: Every day | ORAL | 1 refills | Status: DC

## 2017-12-30 NOTE — Patient Instructions (Signed)

## 2017-12-30 NOTE — Progress Notes (Signed)
Subjective:  Patient ID: Sarah Watts, female    DOB: 05/03/1966  Age: 52 y.o. MRN: [id number]  CC: Hypertension and Hypothyroidism   HPI Sarah Watts presents for f/up - She complains that her blood pressure is not adequately well controlled.  For some reason she is not taking the ARB.  She does not know why.  She is taking the diuretic.  She complains of weight gain.  She snores but does not think she has sleep apnea.  She is not willing to do another sleep study.  She has a history of vitamin B12 deficiency but she is not receiving any vitamin B12 supplement.  Outpatient Medications Prior to Visit  Medication Sig Dispense Refill  . Diclofenac Sodium (PENNSAID) 2 % SOLN Place 2 application onto the skin 2 (two) times daily. 112 g 3  . fluticasone (FLONASE) 50 MCG/ACT nasal spray USE TWO SPRAY(S) IN EACH NOSTRIL ONCE DAILY 16 g 11  . gabapentin (NEURONTIN) 100 MG capsule Take 2 capsules (200 mg total) by mouth at bedtime. 60 capsule 3  . KLOR-CON M20 20 MEQ tablet     . loratadine (CLARITIN) 10 MG tablet Take 10 mg by mouth daily.      Marland Kitchen triamterene-hydrochlorothiazide (DYAZIDE) 37.5-25 MG capsule Take 1 each (1 capsule total) by mouth daily. 30 capsule 0  . Vitamin D, Ergocalciferol, (DRISDOL) 50000 units CAPS capsule TAKE 1 CAPSULE BY MOUTH EVERY 7 DAYS 12 capsule 0  . levothyroxine (SYNTHROID, LEVOTHROID) 75 MCG tablet Take 1 tablet (75 mcg total) by mouth daily. 30 tablet 0  . irbesartan (AVAPRO) 150 MG tablet Take 1 tablet (150 mg total) by mouth daily. (Patient not taking: Reported on 12/30/2017) 30 tablet 0   No facility-administered medications prior to visit.     ROS Review of Systems  Constitutional: Positive for unexpected weight change. Negative for appetite change, diaphoresis and fatigue.  HENT: Negative.   Eyes: Negative for visual disturbance.  Respiratory: Negative for cough, chest tightness, shortness of breath and wheezing.   Cardiovascular: Negative for  chest pain, palpitations and leg swelling.  Gastrointestinal: Negative for abdominal pain, diarrhea, nausea and vomiting.  Endocrine: Negative.  Negative for cold intolerance and heat intolerance.  Genitourinary: Negative.  Negative for difficulty urinating.  Musculoskeletal: Negative.  Negative for arthralgias, back pain, myalgias and neck pain.  Allergic/Immunologic: Negative.   Neurological: Negative.  Negative for dizziness, weakness and numbness.  Hematological: Negative for adenopathy. Does not bruise/bleed easily.  Psychiatric/Behavioral: Negative.     Objective:  BP (!) 166/92 (BP Location: Left Arm, Patient Position: Sitting, Cuff Size: Large)   Pulse 68   Temp 97.6 F (36.4 C) (Oral)   Resp 16   Ht 5' (1.524 m)   Wt 177 lb 2 oz (80.3 kg)   SpO2 96%   BMI 34.59 kg/m   BP Readings from Last 3 Encounters:  12/30/17 (!) 166/92  04/23/17 140/84  04/04/17 128/80    Wt Readings from Last 3 Encounters:  12/30/17 177 lb 2 oz (80.3 kg)  04/23/17 170 lb (77.1 kg)  04/04/17 172 lb 12 oz (78.4 kg)    Physical Exam  Constitutional: She is oriented to person, place, and time. No distress.  HENT:  Mouth/Throat: Oropharynx is clear and moist.  Eyes: Conjunctivae are normal. Left eye exhibits no discharge. No scleral icterus.  Neck: Normal range of motion. Neck supple. No JVD present. No thyromegaly present.  Cardiovascular: Normal rate, regular rhythm and normal heart sounds. Exam  reveals no gallop and no friction rub.  No murmur heard. Pulmonary/Chest: Effort normal and breath sounds normal. No respiratory distress. She has no wheezes. She has no rales.  Abdominal: Soft. Bowel sounds are normal. She exhibits no distension and no mass. There is no tenderness. There is no guarding.  Musculoskeletal: Normal range of motion. She exhibits no edema, tenderness or deformity.  Lymphadenopathy:    She has no cervical adenopathy.  Neurological: She is alert and oriented to person,  place, and time.  Skin: Skin is warm and dry. No rash noted. She is not diaphoretic. No erythema. No pallor.  Vitals reviewed.   Lab Results  Component Value Date   WBC 9.1 12/30/2017   HGB 14.1 12/30/2017   HCT 41.5 12/30/2017   PLT 433.0 (H) 12/30/2017   GLUCOSE 100 (H) 12/30/2017   CHOL 197 03/18/2017   TRIG 208.0 (H) 03/18/2017   HDL 42.20 03/18/2017   LDLDIRECT 118.0 03/18/2017   LDLCALC 118 (H) 03/12/2016   ALT 22 03/18/2017   AST 19 03/18/2017   NA 140 12/30/2017   K 3.9 12/30/2017   CL 100 12/30/2017   CREATININE 1.01 12/30/2017   BUN 13 12/30/2017   CO2 33 (H) 12/30/2017   TSH 2.84 12/30/2017   HGBA1C 6.1 12/30/2017    Mm Digital Screening Bilateral  Result Date: 04/02/2017 CLINICAL DATA:  Screening. EXAM: DIGITAL SCREENING BILATERAL MAMMOGRAM WITH CAD COMPARISON:  Previous exam(s). ACR Breast Density Category b: There are scattered areas of fibroglandular density. FINDINGS: There are no findings suspicious for malignancy. Images were processed with CAD. IMPRESSION: No mammographic evidence of malignancy. A result letter of this screening mammogram will be mailed directly to the patient. RECOMMENDATION: Screening mammogram in one year. (Code:SM-B-01Y) BI-RADS CATEGORY  1: Negative. Electronically Signed   By: Everlean Alstrom M.D.   On: 04/02/2017 08:15    Assessment & Plan:   Sarah Watts was seen today for hypertension and hypothyroidism.  Diagnoses and all orders for this visit:  Thrombocytosis ([place])- Her platelet count remains mildly elevated.  Her other cell lines are normal.  This is most likely related to the B12 deficiency. -     CBC with Differential/Platelet; Future -     Vitamin B12; Future  Acquired hypothyroidism- Her TSH is in the normal range.  She will stay on the current dose of levothyroxine. -     TSH; Future -     levothyroxine (SYNTHROID, LEVOTHROID) 75 MCG tablet; Take 1 tablet (75 mcg total) by mouth daily.  Essential hypertension-Her blood  pressure is not adequately well controlled.  Her electrolytes and renal function are normal.  Will add an ARB to the thiazide diuretic for better blood pressure control. -     Basic metabolic panel; Future -     Azilsartan Medoxomil (EDARBI) 80 MG TABS; Take 1 tablet (80 mg total) by mouth daily.  Prediabetes- Her A1c is down to 6.1%.  She is prediabetic.  Medical therapy is not indicated. -     Basic metabolic panel; Future -     Hemoglobin A1c; Future  B12 nutritional deficiency- I have asked her to return to restart parenteral B12 replacement therapy. -     Vitamin B12; Future   I have discontinued Sarah Watts. Geibel's irbesartan. I am also having her start on Azilsartan Medoxomil. Additionally, I am having her maintain her loratadine, KLOR-CON M20, fluticasone, Diclofenac Sodium, gabapentin, Vitamin D (Ergocalciferol), triamterene-hydrochlorothiazide, and levothyroxine.  Meds ordered this encounter  Medications  .  Azilsartan Medoxomil (EDARBI) 80 MG TABS    Sig: Take 1 tablet (80 mg total) by mouth daily.    Dispense:  90 tablet    Refill:  1  . levothyroxine (SYNTHROID, LEVOTHROID) 75 MCG tablet    Sig: Take 1 tablet (75 mcg total) by mouth daily.    Dispense:  90 tablet    Refill:  0     Follow-up: Return in about 3 months (around 04/01/2018).  Scarlette Calico, MD

## 2017-12-31 ENCOUNTER — Ambulatory Visit (INDEPENDENT_AMBULATORY_CARE_PROVIDER_SITE_OTHER): Payer: BLUE CROSS/BLUE SHIELD

## 2017-12-31 DIAGNOSIS — E538 Deficiency of other specified B group vitamins: Secondary | ICD-10-CM

## 2017-12-31 MED ORDER — CYANOCOBALAMIN 1000 MCG/ML IJ SOLN
1000.0000 ug | Freq: Once | INTRAMUSCULAR | Status: AC
  Administered 2017-12-31: 1000 ug via INTRAMUSCULAR

## 2018-01-03 ENCOUNTER — Telehealth: Payer: Self-pay

## 2018-01-03 NOTE — Telephone Encounter (Signed)
Key: [id number]

## 2018-01-03 NOTE — Telephone Encounter (Signed)
Spoke to patient and informed.

## 2018-01-03 NOTE — Telephone Encounter (Signed)
PA has been approved. Can you inform pt of same.  

## 2018-01-06 ENCOUNTER — Ambulatory Visit (INDEPENDENT_AMBULATORY_CARE_PROVIDER_SITE_OTHER): Payer: BLUE CROSS/BLUE SHIELD

## 2018-01-06 DIAGNOSIS — E538 Deficiency of other specified B group vitamins: Secondary | ICD-10-CM

## 2018-01-06 MED ORDER — CYANOCOBALAMIN 1000 MCG/ML IJ SOLN
1000.0000 ug | Freq: Once | INTRAMUSCULAR | Status: AC
  Administered 2018-01-06: 1000 ug via INTRAMUSCULAR

## 2018-01-10 ENCOUNTER — Ambulatory Visit (INDEPENDENT_AMBULATORY_CARE_PROVIDER_SITE_OTHER): Payer: BLUE CROSS/BLUE SHIELD

## 2018-01-10 DIAGNOSIS — E538 Deficiency of other specified B group vitamins: Secondary | ICD-10-CM

## 2018-01-10 MED ORDER — CYANOCOBALAMIN 1000 MCG/ML IJ SOLN
1000.0000 ug | Freq: Once | INTRAMUSCULAR | Status: AC
  Administered 2018-01-10: 1000 ug via INTRAMUSCULAR

## 2018-01-10 NOTE — Progress Notes (Signed)
b12 Injection given.   Sarah Watts J Aja Bolander, MD  

## 2018-01-13 NOTE — Progress Notes (Signed)
reviewed

## 2018-01-20 ENCOUNTER — Ambulatory Visit (INDEPENDENT_AMBULATORY_CARE_PROVIDER_SITE_OTHER): Payer: BLUE CROSS/BLUE SHIELD

## 2018-01-20 DIAGNOSIS — E538 Deficiency of other specified B group vitamins: Secondary | ICD-10-CM | POA: Diagnosis not present

## 2018-01-20 MED ORDER — CYANOCOBALAMIN 1000 MCG/ML IJ SOLN
1000.0000 ug | Freq: Once | INTRAMUSCULAR | Status: AC
  Administered 2018-01-20: 1000 ug via INTRAMUSCULAR

## 2018-01-28 NOTE — Progress Notes (Signed)
I have reviewed and agree.

## 2018-02-10 ENCOUNTER — Ambulatory Visit (INDEPENDENT_AMBULATORY_CARE_PROVIDER_SITE_OTHER): Payer: BLUE CROSS/BLUE SHIELD

## 2018-02-10 DIAGNOSIS — E538 Deficiency of other specified B group vitamins: Secondary | ICD-10-CM

## 2018-02-10 MED ORDER — CYANOCOBALAMIN 1000 MCG/ML IJ SOLN
1000.0000 ug | Freq: Once | INTRAMUSCULAR | Status: AC
  Administered 2018-02-10: 1000 ug via INTRAMUSCULAR

## 2018-02-10 NOTE — Progress Notes (Signed)
I have reviewed and agree.

## 2018-02-11 NOTE — Addendum Note (Signed)
Addended by: Ander Slade on: 02/11/2018 08:48 AM   Modules accepted: Level of Service

## 2018-03-01 ENCOUNTER — Other Ambulatory Visit: Payer: Self-pay | Admitting: Internal Medicine

## 2018-03-10 ENCOUNTER — Ambulatory Visit (INDEPENDENT_AMBULATORY_CARE_PROVIDER_SITE_OTHER): Payer: BLUE CROSS/BLUE SHIELD

## 2018-03-10 DIAGNOSIS — E538 Deficiency of other specified B group vitamins: Secondary | ICD-10-CM | POA: Diagnosis not present

## 2018-03-10 MED ORDER — CYANOCOBALAMIN 1000 MCG/ML IJ SOLN
1000.0000 ug | Freq: Once | INTRAMUSCULAR | Status: AC
  Administered 2018-03-10: 1000 ug via INTRAMUSCULAR

## 2018-03-10 NOTE — Progress Notes (Signed)
Medical screening examination/treatment/procedure(s) were performed by non-physician practitioner and as supervising physician I was immediately available for consultation/collaboration. I agree with above. Dvaughn Fickle, MD   

## 2018-04-01 ENCOUNTER — Ambulatory Visit: Payer: BLUE CROSS/BLUE SHIELD | Admitting: Internal Medicine

## 2018-04-17 ENCOUNTER — Encounter: Payer: Self-pay | Admitting: Internal Medicine

## 2018-04-17 ENCOUNTER — Other Ambulatory Visit (INDEPENDENT_AMBULATORY_CARE_PROVIDER_SITE_OTHER): Payer: BLUE CROSS/BLUE SHIELD

## 2018-04-17 ENCOUNTER — Ambulatory Visit (INDEPENDENT_AMBULATORY_CARE_PROVIDER_SITE_OTHER): Payer: BLUE CROSS/BLUE SHIELD | Admitting: Internal Medicine

## 2018-04-17 VITALS — BP 154/86 | HR 84 | Temp 98.1°F | Resp 16 | Ht 60.0 in | Wt 173.2 lb

## 2018-04-17 DIAGNOSIS — E538 Deficiency of other specified B group vitamins: Secondary | ICD-10-CM

## 2018-04-17 DIAGNOSIS — E039 Hypothyroidism, unspecified: Secondary | ICD-10-CM

## 2018-04-17 DIAGNOSIS — E876 Hypokalemia: Secondary | ICD-10-CM | POA: Diagnosis not present

## 2018-04-17 DIAGNOSIS — D75839 Thrombocytosis, unspecified: Secondary | ICD-10-CM

## 2018-04-17 DIAGNOSIS — I10 Essential (primary) hypertension: Secondary | ICD-10-CM

## 2018-04-17 DIAGNOSIS — D473 Essential (hemorrhagic) thrombocythemia: Secondary | ICD-10-CM

## 2018-04-17 LAB — CBC WITH DIFFERENTIAL/PLATELET
Basophils Absolute: 0.1 10*3/uL (ref 0.0–0.1)
Basophils Relative: 1.3 % (ref 0.0–3.0)
Eosinophils Absolute: 0.2 10*3/uL (ref 0.0–0.7)
Eosinophils Relative: 2.4 % (ref 0.0–5.0)
HCT: 41.4 % (ref 36.0–46.0)
Hemoglobin: 13.9 g/dL (ref 12.0–15.0)
LYMPHS ABS: 2.3 10*3/uL (ref 0.7–4.0)
Lymphocytes Relative: 24.9 % (ref 12.0–46.0)
MCHC: 33.6 g/dL (ref 30.0–36.0)
MCV: 86.3 fl (ref 78.0–100.0)
MONO ABS: 0.7 10*3/uL (ref 0.1–1.0)
MONOS PCT: 7.1 % (ref 3.0–12.0)
NEUTROS ABS: 6.1 10*3/uL (ref 1.4–7.7)
NEUTROS PCT: 64.3 % (ref 43.0–77.0)
Platelets: 417 10*3/uL — ABNORMAL HIGH (ref 150.0–400.0)
RBC: 4.8 Mil/uL (ref 3.87–5.11)
RDW: 13.4 % (ref 11.5–15.5)
WBC: 9.4 10*3/uL (ref 4.0–10.5)

## 2018-04-17 LAB — TSH: TSH: 1.27 u[IU]/mL (ref 0.35–4.50)

## 2018-04-17 LAB — BASIC METABOLIC PANEL
BUN: 13 mg/dL (ref 6–23)
CALCIUM: 9.9 mg/dL (ref 8.4–10.5)
CO2: 32 mEq/L (ref 19–32)
CREATININE: 0.98 mg/dL (ref 0.40–1.20)
Chloride: 100 mEq/L (ref 96–112)
GFR: 63.06 mL/min (ref >60.00)
Glucose, Bld: 114 mg/dL — ABNORMAL HIGH (ref 70–99)
Potassium: 3.5 mEq/L (ref 3.5–5.1)
Sodium: 140 mEq/L (ref 135–145)

## 2018-04-17 MED ORDER — CYANOCOBALAMIN 1000 MCG/ML IJ SOLN
1000.0000 ug | Freq: Once | INTRAMUSCULAR | Status: AC
  Administered 2018-04-17: 1000 ug via INTRAMUSCULAR

## 2018-04-17 MED ORDER — AZILSARTAN MEDOXOMIL 80 MG PO TABS: 1.0000 | ORAL_TABLET | Freq: Every day | ORAL | 1 refills | Status: DC

## 2018-04-17 MED ORDER — TRIAMTERENE-HCTZ 37.5-25 MG PO CAPS: 1.0000 | ORAL_CAPSULE | Freq: Every day | ORAL | 1 refills | Status: DC

## 2018-04-17 NOTE — Progress Notes (Signed)
Subjective:  Patient ID: Sarah Watts, female    DOB: [birth year]  Age: 52 y.o. MRN: [id number]  CC: Hypertension and Hypothyroidism   HPI Sarah Watts presents for f/up - She feels well and offers no complaints.  According to her prescription refills she is not taking the diuretic but she tells me she is taking the ARB.  She does not monitor her blood pressure but she denies headache, blurred vision, chest pain, shortness of breath, palpitations, or edema. She complains of chronic, unchanged fatigue.  She has intentionally lost 4 pounds.  Outpatient Medications Prior to Visit  Medication Sig Dispense Refill  . Diclofenac Sodium (PENNSAID) 2 % SOLN Place 2 application onto the skin 2 (two) times daily. 112 g 3  . fluticasone (FLONASE) 50 MCG/ACT nasal spray USE TWO SPRAY(S) IN EACH NOSTRIL ONCE DAILY 48 g 1  . gabapentin (NEURONTIN) 100 MG capsule Take 2 capsules (200 mg total) by mouth at bedtime. 60 capsule 3  . KLOR-CON M20 20 MEQ tablet     . levothyroxine (SYNTHROID, LEVOTHROID) 75 MCG tablet Take 1 tablet (75 mcg total) by mouth daily. 90 tablet 0  . loratadine (CLARITIN) 10 MG tablet Take 10 mg by mouth daily.      . Vitamin D, Ergocalciferol, (DRISDOL) 50000 units CAPS capsule TAKE 1 CAPSULE BY MOUTH EVERY 7 DAYS 12 capsule 0  . Azilsartan Medoxomil (EDARBI) 80 MG TABS Take 1 tablet (80 mg total) by mouth daily. 90 tablet 1  . triamterene-hydrochlorothiazide (DYAZIDE) 37.5-25 MG capsule Take 1 each (1 capsule total) by mouth daily. 30 capsule 0   No facility-administered medications prior to visit.     ROS Review of Systems  Constitutional: Positive for fatigue. Negative for diaphoresis and unexpected weight change.  HENT: Negative.   Eyes: Negative for visual disturbance.  Respiratory: Negative.  Negative for cough, chest tightness, shortness of breath and wheezing.   Cardiovascular: Negative for chest pain, palpitations and leg swelling.  Gastrointestinal: Negative  for abdominal pain, constipation, diarrhea, nausea and vomiting.  Endocrine: Negative for cold intolerance and heat intolerance.  Genitourinary: Negative for difficulty urinating, dysuria and hematuria.  Musculoskeletal: Negative.  Negative for arthralgias and myalgias.  Skin: Negative.  Negative for color change and pallor.  Neurological: Negative.  Negative for dizziness, weakness and light-headedness.  Hematological: Negative for adenopathy. Does not bruise/bleed easily.  Psychiatric/Behavioral: Negative.     Objective:  BP (!) 154/86 (BP Location: Left Arm, Patient Position: Sitting, Cuff Size: Normal)   Pulse 84   Temp 98.1 F (36.7 C) (Oral)   Resp 16   Ht 5' (1.524 m)   Wt 173 lb 4 oz (78.6 kg)   SpO2 96%   BMI 33.84 kg/m   BP Readings from Last 3 Encounters:  04/17/18 (!) 154/86  12/30/17 (!) 166/92  04/23/17 140/84    Wt Readings from Last 3 Encounters:  04/17/18 173 lb 4 oz (78.6 kg)  12/30/17 177 lb 2 oz (80.3 kg)  04/23/17 170 lb (77.1 kg)    Physical Exam  Constitutional: She is oriented to person, place, and time. No distress.  HENT:  Mouth/Throat: Oropharynx is clear and moist. No oropharyngeal exudate.  Eyes: Conjunctivae are normal. Right eye exhibits no discharge. Left eye exhibits no discharge. No scleral icterus.  Neck: Normal range of motion. Neck supple. No JVD present. No thyromegaly present.  Cardiovascular: Normal rate, regular rhythm and normal heart sounds.  No murmur heard. Pulmonary/Chest: Effort normal and breath sounds  normal. No respiratory distress. She has no wheezes. She has no rales.  Abdominal: Soft. Normal appearance and bowel sounds are normal. She exhibits no mass. There is no hepatosplenomegaly. There is no tenderness. No hernia.  Musculoskeletal: Normal range of motion. She exhibits no edema, tenderness or deformity.  Lymphadenopathy:    She has no cervical adenopathy.  Neurological: She is alert and oriented to person, place,  and time.  Skin: Skin is warm and dry. She is not diaphoretic.  Vitals reviewed.   Lab Results  Component Value Date   WBC 9.4 04/17/2018   HGB 13.9 04/17/2018   HCT 41.4 04/17/2018   PLT 417.0 (H) 04/17/2018   GLUCOSE 114 (H) 04/17/2018   CHOL 197 03/18/2017   TRIG 208.0 (H) 03/18/2017   HDL 42.20 03/18/2017   LDLDIRECT 118.0 03/18/2017   LDLCALC 118 (H) 03/12/2016   ALT 22 03/18/2017   AST 19 03/18/2017   NA 140 04/17/2018   K 3.5 04/17/2018   CL 100 04/17/2018   CREATININE 0.98 04/17/2018   BUN 13 04/17/2018   CO2 32 04/17/2018   TSH 1.27 04/17/2018   HGBA1C 6.1 12/30/2017    Mm Digital Screening Bilateral  Result Date: 04/02/2017 CLINICAL DATA:  Screening. EXAM: DIGITAL SCREENING BILATERAL MAMMOGRAM WITH CAD COMPARISON:  Previous exam(s). ACR Breast Density Category b: There are scattered areas of fibroglandular density. FINDINGS: There are no findings suspicious for malignancy. Images were processed with CAD. IMPRESSION: No mammographic evidence of malignancy. A result letter of this screening mammogram will be mailed directly to the patient. RECOMMENDATION: Screening mammogram in one year. (Code:SM-B-01Y) BI-RADS CATEGORY  1: Negative. Electronically Signed   By: Everlean Alstrom M.D.   On: 04/02/2017 08:15    Assessment & Plan:   Sarah Watts was seen today for hypertension and hypothyroidism.  Diagnoses and all orders for this visit:  Acquired hypothyroidism- Her TSH is in the normal range.  She will remain on the current dose of levothyroxine. -     TSH; Future  B12 nutritional deficiency -     cyanocobalamin ((VITAMIN B-12)) injection 1,000 mcg  Thrombocytosis ([place])- Her platelet count is improving with B12 replacement therapy.  At this time I do not see any significant lymphoproliferative disease.  She has no signs of coagulopathy.  Will continue to monitor her platelet count. -     CBC with Differential/Platelet; Future  Essential hypertension- Her blood  pressure is not adequately well controlled and it appears she is not compliant with the diuretic.  I have asked her to restart Dyazide and to continue the ARB at the current dose. -     Basic metabolic panel; Future -     triamterene-hydrochlorothiazide (DYAZIDE) 37.5-25 MG capsule; Take 1 each (1 capsule total) by mouth daily. -     Azilsartan Medoxomil (EDARBI) 80 MG TABS; Take 1 tablet (80 mg total) by mouth daily.  Hypokalemia-  Her potassium level is still on the borderline low and.  I have asked her to restart her potassium sparing diuretic. -     triamterene-hydrochlorothiazide (DYAZIDE) 37.5-25 MG capsule; Take 1 each (1 capsule total) by mouth daily.   I am having Sarah Watts maintain her loratadine, KLOR-CON M20, Diclofenac Sodium, gabapentin, Vitamin D (Ergocalciferol), levothyroxine, fluticasone, triamterene-hydrochlorothiazide, and Azilsartan Medoxomil. We administered cyanocobalamin.  Meds ordered this encounter  Medications  . cyanocobalamin ((VITAMIN B-12)) injection 1,000 mcg  . triamterene-hydrochlorothiazide (DYAZIDE) 37.5-25 MG capsule    Sig: Take 1 each (1 capsule total) by  mouth daily.    Dispense:  90 capsule    Refill:  1  . Azilsartan Medoxomil (EDARBI) 80 MG TABS    Sig: Take 1 tablet (80 mg total) by mouth daily.    Dispense:  90 tablet    Refill:  1     Follow-up: Return in about 4 months (around 08/17/2018).  Scarlette Calico, MD

## 2018-04-17 NOTE — Patient Instructions (Signed)

## 2018-04-21 ENCOUNTER — Telehealth: Payer: Self-pay | Admitting: Internal Medicine

## 2018-04-21 NOTE — Telephone Encounter (Signed)
Copied from [place] [phone]. Topic: Quick Communication - See Telephone Encounter >> Apr 21, 2018  1:35 PM Synthia Innocent wrote: CRM for notification. See Telephone encounter for: 04/21/18. Would like to speak with CMA regarding BP meds, Azilsartan Medoxomil (EDARBI) 80 MG TABS, wants to make sure she is taking the right thing.

## 2018-04-21 NOTE — Telephone Encounter (Signed)
lvm for pt to call back to discuss. Can send her to [place] she calls back.

## 2018-04-21 NOTE — Telephone Encounter (Signed)
Pt called back and we reviewed medication with patient.

## 2018-04-23 ENCOUNTER — Other Ambulatory Visit: Payer: Self-pay | Admitting: Internal Medicine

## 2018-04-23 DIAGNOSIS — E039 Hypothyroidism, unspecified: Secondary | ICD-10-CM

## 2018-04-24 ENCOUNTER — Ambulatory Visit (INDEPENDENT_AMBULATORY_CARE_PROVIDER_SITE_OTHER): Payer: BLUE CROSS/BLUE SHIELD | Admitting: *Deleted

## 2018-04-24 DIAGNOSIS — E538 Deficiency of other specified B group vitamins: Secondary | ICD-10-CM | POA: Diagnosis not present

## 2018-04-24 MED ORDER — CYANOCOBALAMIN 1000 MCG/ML IJ SOLN
1000.0000 ug | Freq: Once | INTRAMUSCULAR | Status: AC
  Administered 2018-04-24: 1000 ug via INTRAMUSCULAR

## 2018-04-24 NOTE — Progress Notes (Signed)
I have reviewed and agree.

## 2018-04-30 ENCOUNTER — Ambulatory Visit: Payer: BLUE CROSS/BLUE SHIELD

## 2018-05-08 ENCOUNTER — Ambulatory Visit: Payer: BLUE CROSS/BLUE SHIELD

## 2018-05-22 ENCOUNTER — Ambulatory Visit (INDEPENDENT_AMBULATORY_CARE_PROVIDER_SITE_OTHER): Payer: BLUE CROSS/BLUE SHIELD

## 2018-05-22 DIAGNOSIS — E538 Deficiency of other specified B group vitamins: Secondary | ICD-10-CM | POA: Diagnosis not present

## 2018-05-22 MED ORDER — CYANOCOBALAMIN 1000 MCG/ML IJ SOLN
1000.0000 ug | Freq: Once | INTRAMUSCULAR | Status: AC
  Administered 2018-05-22: 1000 ug via INTRAMUSCULAR

## 2018-05-22 NOTE — Progress Notes (Signed)
I have reviewed and agree.

## 2018-06-05 ENCOUNTER — Ambulatory Visit: Payer: BLUE CROSS/BLUE SHIELD

## 2018-06-26 ENCOUNTER — Ambulatory Visit (INDEPENDENT_AMBULATORY_CARE_PROVIDER_SITE_OTHER): Payer: BLUE CROSS/BLUE SHIELD

## 2018-06-26 DIAGNOSIS — E538 Deficiency of other specified B group vitamins: Secondary | ICD-10-CM | POA: Diagnosis not present

## 2018-06-26 MED ORDER — CYANOCOBALAMIN 1000 MCG/ML IJ SOLN
1000.0000 ug | Freq: Once | INTRAMUSCULAR | Status: AC
  Administered 2018-06-26: 1000 ug via INTRAMUSCULAR

## 2018-06-26 NOTE — Progress Notes (Addendum)
B12 given in office today.  I have reviewed and agree 

## 2018-08-19 ENCOUNTER — Encounter: Payer: Self-pay | Admitting: Family

## 2018-08-19 ENCOUNTER — Other Ambulatory Visit: Payer: Self-pay

## 2018-08-19 ENCOUNTER — Ambulatory Visit (INDEPENDENT_AMBULATORY_CARE_PROVIDER_SITE_OTHER): Payer: BLUE CROSS/BLUE SHIELD | Admitting: Family

## 2018-08-19 VITALS — BP 138/80 | HR 74 | Temp 98.0°F | Ht 60.0 in | Wt 166.0 lb

## 2018-08-19 DIAGNOSIS — K5900 Constipation, unspecified: Secondary | ICD-10-CM | POA: Diagnosis not present

## 2018-08-19 DIAGNOSIS — E876 Hypokalemia: Secondary | ICD-10-CM

## 2018-08-19 DIAGNOSIS — R112 Nausea with vomiting, unspecified: Secondary | ICD-10-CM | POA: Diagnosis not present

## 2018-08-19 DIAGNOSIS — E538 Deficiency of other specified B group vitamins: Secondary | ICD-10-CM

## 2018-08-19 DIAGNOSIS — M545 Low back pain, unspecified: Secondary | ICD-10-CM

## 2018-08-19 MED ORDER — CYANOCOBALAMIN 1000 MCG/ML IJ SOLN
1000.0000 ug | Freq: Once | INTRAMUSCULAR | Status: AC
  Administered 2018-08-19: 1000 ug via INTRAMUSCULAR

## 2018-08-19 MED ORDER — ONDANSETRON 4 MG PO TBDP: 4.0000 mg | ORAL_TABLET | Freq: Three times a day (TID) | ORAL | 0 refills | Status: AC | PRN
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
## 2018-08-20 ENCOUNTER — Ambulatory Visit: Payer: BLUE CROSS/BLUE SHIELD | Admitting: Family Medicine

## 2018-08-28 ENCOUNTER — Encounter: Payer: Self-pay | Admitting: Internal Medicine

## 2018-08-28 ENCOUNTER — Other Ambulatory Visit (INDEPENDENT_AMBULATORY_CARE_PROVIDER_SITE_OTHER): Payer: BLUE CROSS/BLUE SHIELD

## 2018-08-28 ENCOUNTER — Ambulatory Visit (INDEPENDENT_AMBULATORY_CARE_PROVIDER_SITE_OTHER): Payer: BLUE CROSS/BLUE SHIELD | Admitting: Internal Medicine

## 2018-08-28 VITALS — BP 130/74 | HR 78 | Temp 98.2°F | Resp 16 | Ht 60.0 in | Wt 167.0 lb

## 2018-08-28 DIAGNOSIS — D473 Essential (hemorrhagic) thrombocythemia: Secondary | ICD-10-CM

## 2018-08-28 DIAGNOSIS — Z23 Encounter for immunization: Secondary | ICD-10-CM

## 2018-08-28 DIAGNOSIS — E039 Hypothyroidism, unspecified: Secondary | ICD-10-CM

## 2018-08-28 DIAGNOSIS — I10 Essential (primary) hypertension: Secondary | ICD-10-CM

## 2018-08-28 DIAGNOSIS — R7303 Prediabetes: Secondary | ICD-10-CM

## 2018-08-28 DIAGNOSIS — E538 Deficiency of other specified B group vitamins: Secondary | ICD-10-CM

## 2018-08-28 DIAGNOSIS — Z0000 Encounter for general adult medical examination without abnormal findings: Secondary | ICD-10-CM | POA: Diagnosis not present

## 2018-08-28 DIAGNOSIS — Z720 Tobacco use: Secondary | ICD-10-CM

## 2018-08-28 DIAGNOSIS — D75839 Thrombocytosis, unspecified: Secondary | ICD-10-CM

## 2018-08-28 DIAGNOSIS — Z1231 Encounter for screening mammogram for malignant neoplasm of breast: Secondary | ICD-10-CM

## 2018-08-28 LAB — BASIC METABOLIC PANEL
BUN: 15 mg/dL (ref 6–23)
CALCIUM: 10.2 mg/dL (ref 8.4–10.5)
CHLORIDE: 102 meq/L (ref 96–112)
CO2: 32 mEq/L (ref 19–32)
CREATININE: 1.18 mg/dL (ref 0.40–1.20)
GFR: 50.83 mL/min — ABNORMAL LOW (ref >60.00)
Glucose, Bld: 110 mg/dL — ABNORMAL HIGH (ref 70–99)
Potassium: 3.9 mEq/L (ref 3.5–5.1)
Sodium: 139 mEq/L (ref 135–145)

## 2018-08-28 LAB — CBC WITH DIFFERENTIAL/PLATELET
BASOS PCT: 0.9 % (ref 0.0–3.0)
Basophils Absolute: 0.1 10*3/uL (ref 0.0–0.1)
EOS ABS: 0.2 10*3/uL (ref 0.0–0.7)
Eosinophils Relative: 2.5 % (ref 0.0–5.0)
HCT: 39.2 % (ref 36.0–46.0)
HEMOGLOBIN: 13.3 g/dL (ref 12.0–15.0)
Lymphocytes Relative: 21.7 % (ref 12.0–46.0)
Lymphs Abs: 2.1 10*3/uL (ref 0.7–4.0)
MCHC: 34 g/dL (ref 30.0–36.0)
MCV: 86.4 fl (ref 78.0–100.0)
MONO ABS: 0.7 10*3/uL (ref 0.1–1.0)
Monocytes Relative: 7.6 % (ref 3.0–12.0)
Neutro Abs: 6.6 10*3/uL (ref 1.4–7.7)
Neutrophils Relative %: 67.3 % (ref 43.0–77.0)
PLATELETS: 411 10*3/uL — AB (ref 150.0–400.0)
RBC: 4.54 Mil/uL (ref 3.87–5.11)
RDW: 12.9 % (ref 11.5–15.5)
WBC: 9.8 10*3/uL (ref 4.0–10.5)

## 2018-08-28 LAB — HEMOGLOBIN A1C: Hgb A1c MFr Bld: 6.1 % (ref 4.6–6.5)

## 2018-08-28 LAB — LIPID PANEL
CHOL/HDL RATIO: 4
Cholesterol: 139 mg/dL (ref 0–200)
HDL: 35.3 mg/dL — ABNORMAL LOW (ref >39.00)
NONHDL: 103.68
Triglycerides: 264 mg/dL — ABNORMAL HIGH (ref 0.0–149.0)
VLDL: 52.8 mg/dL — AB (ref 0.0–40.0)

## 2018-08-28 LAB — TSH: TSH: 1 u[IU]/mL (ref 0.35–4.50)

## 2018-08-28 LAB — LDL CHOLESTEROL, DIRECT: LDL DIRECT: 82 mg/dL

## 2018-08-28 NOTE — Patient Instructions (Signed)

## 2018-08-28 NOTE — Progress Notes (Signed)
Subjective:  Patient ID: Sarah Watts, female    DOB: 07-16-1966  Age: 52 y.o. MRN: [id number]  CC: Annual Exam; Hypertension; Hypothyroidism; and Hyperlipidemia   HPI Sarah Watts presents for a CPX.  Sarah Watts feels well today.  Sarah Watts tells me her blood pressure has been well controlled.  Sarah Watts also feels like her thyroid dose has been adequate.  Outpatient Medications Prior to Visit  Medication Sig Dispense Refill  . Azilsartan Medoxomil (EDARBI) 80 MG TABS Take 1 tablet (80 mg total) by mouth daily. 90 tablet 1  . baclofen (LIORESAL) 20 MG tablet TAKE 1 TABLET BY MOUTH THREE TIMES DAILY FOR 30 DAYS  0  . Diclofenac Sodium (PENNSAID) 2 % SOLN Place 2 application onto the skin 2 (two) times daily. 112 g 3  . fluticasone (FLONASE) 50 MCG/ACT nasal spray USE TWO SPRAY(S) IN EACH NOSTRIL ONCE DAILY 48 g 1  . levothyroxine (SYNTHROID, LEVOTHROID) 75 MCG tablet TAKE 1 TABLET BY MOUTH ONCE DAILY 90 tablet 1  . loratadine (CLARITIN) 10 MG tablet Take 10 mg by mouth daily.      Marland Kitchen triamterene-hydrochlorothiazide (DYAZIDE) 37.5-25 MG capsule Take 1 each (1 capsule total) by mouth daily. 90 capsule 1   No facility-administered medications prior to visit.     ROS Review of Systems  Constitutional: Negative for appetite change, diaphoresis, fatigue and unexpected weight change.  HENT: Negative.   Eyes: Negative for visual disturbance.  Respiratory: Negative for cough, chest tightness, shortness of breath and wheezing.   Cardiovascular: Negative for chest pain, palpitations and leg swelling.  Gastrointestinal: Negative for abdominal pain, diarrhea, nausea and vomiting.  Endocrine: Negative.  Negative for cold intolerance and heat intolerance.  Genitourinary: Negative.  Negative for difficulty urinating.  Musculoskeletal: Negative.  Negative for back pain and myalgias.  Skin: Negative.  Negative for color change and rash.  Neurological: Negative.  Negative for dizziness, weakness,  light-headedness and headaches.  Hematological: Negative for adenopathy. Does not bruise/bleed easily.  Psychiatric/Behavioral: Negative.     Objective:  BP 130/74 (BP Location: Left Arm, Patient Position: Sitting, Cuff Size: Normal)   Pulse 78   Temp 98.2 F (36.8 C) (Oral)   Resp 16   Ht 5' (1.524 m)   Wt 167 lb (75.8 kg)   SpO2 97%   BMI 32.61 kg/m   BP Readings from Last 3 Encounters:  08/28/18 130/74  08/19/18 138/80  04/17/18 (!) 154/86    Wt Readings from Last 3 Encounters:  08/28/18 167 lb (75.8 kg)  08/19/18 166 lb (75.3 kg)  04/17/18 173 lb 4 oz (78.6 kg)    Physical Exam  Constitutional: Sarah Watts is oriented to person, place, and time. Sarah Watts appears well-developed and well-nourished.  HENT:  Mouth/Throat: Oropharynx is clear and moist. No oropharyngeal exudate.  Eyes: Conjunctivae are normal. No scleral icterus.  Neck: Normal range of motion. Neck supple. No JVD present. No thyromegaly present.  Cardiovascular: Normal rate, regular rhythm and normal heart sounds. Exam reveals no gallop.  No murmur heard. Pulmonary/Chest: Effort normal and breath sounds normal. No respiratory distress. Sarah Watts has no wheezes. Sarah Watts has no rales.  Abdominal: Soft. Bowel sounds are normal. Sarah Watts exhibits no mass. There is no hepatosplenomegaly. There is no tenderness.  Musculoskeletal: Normal range of motion. Sarah Watts exhibits no edema, tenderness or deformity.  Lymphadenopathy:    Sarah Watts has no cervical adenopathy.  Neurological: Sarah Watts is alert and oriented to person, place, and time.  Skin: Skin is warm and dry. No pallor.  Vitals reviewed.   Lab Results  Component Value Date   WBC 9.8 08/28/2018   HGB 13.3 08/28/2018   HCT 39.2 08/28/2018   PLT 411.0 (H) 08/28/2018   GLUCOSE 110 (H) 08/28/2018   CHOL 139 08/28/2018   TRIG 264.0 (H) 08/28/2018   HDL 35.30 (L) 08/28/2018   LDLDIRECT 82.0 08/28/2018   LDLCALC 118 (H) 03/12/2016   ALT 22 03/18/2017   AST 19 03/18/2017   NA 139 08/28/2018    K 3.9 08/28/2018   CL 102 08/28/2018   CREATININE 1.18 08/28/2018   BUN 15 08/28/2018   CO2 32 08/28/2018   TSH 1.00 08/28/2018   HGBA1C 6.1 08/28/2018    Mm Digital Screening Bilateral  Result Date: 04/02/2017 CLINICAL DATA:  Screening. EXAM: DIGITAL SCREENING BILATERAL MAMMOGRAM WITH CAD COMPARISON:  Previous exam(s). ACR Breast Density Category b: There are scattered areas of fibroglandular density. FINDINGS: There are no findings suspicious for malignancy. Images were processed with CAD. IMPRESSION: No mammographic evidence of malignancy. A result letter of this screening mammogram will be mailed directly to the patient. RECOMMENDATION: Screening mammogram in one year. (Code:SM-B-01Y) BI-RADS CATEGORY  1: Negative. Electronically Signed   By: Everlean Alstrom M.D.   On: 04/02/2017 08:15    Assessment & Plan:   Sarah Watts was seen today for annual exam, hypertension, hypothyroidism and hyperlipidemia.  Diagnoses and all orders for this visit:  Essential hypertension- Her blood pressure is well controlled.  Electrolytes and renal function are normal. -     Basic metabolic panel; Future  Thrombocytosis ([place])- Her platelet count is slowly normalizing with B12 replacement therapy.  There is no evidence of lymphoproliferative disease. -     CBC with Differential/Platelet; Future  B12 nutritional deficiency- Will continue B12 replacement therapy. -     CBC with Differential/Platelet; Future  Prediabetes- Her A1c is at 6.1%.  Sarah Watts is prediabetic.  Medical therapy is not indicated.  Sarah Watts was encouraged to improve her lifestyle modifications. -     Basic metabolic panel; Future -     Hemoglobin A1c; Future  Routine general medical examination at a health care facility- Exam completed, labs reviewed, Sarah Watts refused a flu vaccine, screening for colon cancer is up-to-date, her Pap is up-to-date, Sarah Watts is referred for a screening mammogram, patient education material was given. -     Lipid panel;  Future  Acquired hypothyroidism- Her TSH is in the normal range.  Sarah Watts will remain on the current dose of levothyroxine. -     TSH; Future  Visit for screening mammogram -     MM DIGITAL SCREENING BILATERAL; Future  Need for influenza vaccination -     Flu Vaccine QUAD 36+ mos IM  Tobacco abuse   I am having Sarah Watts maintain her loratadine, Diclofenac Sodium, fluticasone, triamterene-hydrochlorothiazide, Azilsartan Medoxomil, levothyroxine, and baclofen.  No orders of the defined types were placed in this encounter.    Follow-up: Return in about 6 months (around 02/26/2019).  Scarlette Calico, MD

## 2018-10-26 ENCOUNTER — Other Ambulatory Visit: Payer: Self-pay | Admitting: Internal Medicine

## 2018-10-26 DIAGNOSIS — E876 Hypokalemia: Secondary | ICD-10-CM

## 2018-10-26 DIAGNOSIS — I10 Essential (primary) hypertension: Secondary | ICD-10-CM

## 2018-10-26 DIAGNOSIS — E039 Hypothyroidism, unspecified: Secondary | ICD-10-CM

## 2018-12-31 ENCOUNTER — Other Ambulatory Visit: Payer: Self-pay | Admitting: Internal Medicine

## 2018-12-31 DIAGNOSIS — I10 Essential (primary) hypertension: Secondary | ICD-10-CM

## 2019-01-13 ENCOUNTER — Other Ambulatory Visit: Payer: Self-pay | Admitting: Internal Medicine

## 2019-01-13 DIAGNOSIS — E876 Hypokalemia: Secondary | ICD-10-CM

## 2019-01-13 DIAGNOSIS — I10 Essential (primary) hypertension: Secondary | ICD-10-CM

## 2019-01-13 DIAGNOSIS — E039 Hypothyroidism, unspecified: Secondary | ICD-10-CM

## 2019-02-26 ENCOUNTER — Encounter: Payer: Self-pay | Admitting: Internal Medicine

## 2019-02-26 ENCOUNTER — Other Ambulatory Visit: Payer: Self-pay

## 2019-02-26 ENCOUNTER — Other Ambulatory Visit (INDEPENDENT_AMBULATORY_CARE_PROVIDER_SITE_OTHER): Payer: BLUE CROSS/BLUE SHIELD

## 2019-02-26 ENCOUNTER — Ambulatory Visit (INDEPENDENT_AMBULATORY_CARE_PROVIDER_SITE_OTHER): Payer: BLUE CROSS/BLUE SHIELD | Admitting: Internal Medicine

## 2019-02-26 VITALS — BP 142/78 | HR 87 | Temp 98.3°F | Resp 16 | Ht 60.0 in | Wt 174.5 lb

## 2019-02-26 DIAGNOSIS — D75839 Thrombocytosis, unspecified: Secondary | ICD-10-CM

## 2019-02-26 DIAGNOSIS — D473 Essential (hemorrhagic) thrombocythemia: Secondary | ICD-10-CM

## 2019-02-26 DIAGNOSIS — E538 Deficiency of other specified B group vitamins: Secondary | ICD-10-CM

## 2019-02-26 DIAGNOSIS — I10 Essential (primary) hypertension: Secondary | ICD-10-CM | POA: Diagnosis not present

## 2019-02-26 DIAGNOSIS — E039 Hypothyroidism, unspecified: Secondary | ICD-10-CM

## 2019-02-26 DIAGNOSIS — R7303 Prediabetes: Secondary | ICD-10-CM

## 2019-02-26 DIAGNOSIS — J301 Allergic rhinitis due to pollen: Secondary | ICD-10-CM

## 2019-02-26 DIAGNOSIS — H6983 Other specified disorders of Eustachian tube, bilateral: Secondary | ICD-10-CM

## 2019-02-26 LAB — CBC WITH DIFFERENTIAL/PLATELET
Basophils Absolute: 0.1 10*3/uL (ref 0.0–0.1)
Basophils Relative: 0.6 % (ref 0.0–3.0)
Eosinophils Absolute: 0.3 10*3/uL (ref 0.0–0.7)
Eosinophils Relative: 3.2 % (ref 0.0–5.0)
HCT: 42 % (ref 36.0–46.0)
Hemoglobin: 14.3 g/dL (ref 12.0–15.0)
Lymphocytes Relative: 23.9 % (ref 12.0–46.0)
Lymphs Abs: 2.1 10*3/uL (ref 0.7–4.0)
MCHC: 34.1 g/dL (ref 30.0–36.0)
MCV: 84.7 fl (ref 78.0–100.0)
Monocytes Absolute: 0.7 10*3/uL (ref 0.1–1.0)
Monocytes Relative: 7.8 % (ref 3.0–12.0)
Neutro Abs: 5.7 10*3/uL (ref 1.4–7.7)
Neutrophils Relative %: 64.5 % (ref 43.0–77.0)
Platelets: 413 10*3/uL — ABNORMAL HIGH (ref 150.0–400.0)
RBC: 4.96 Mil/uL (ref 3.87–5.11)
RDW: 13.3 % (ref 11.5–15.5)
WBC: 8.9 10*3/uL (ref 4.0–10.5)

## 2019-02-26 LAB — BASIC METABOLIC PANEL
BUN: 14 mg/dL (ref 6–23)
CO2: 30 mEq/L (ref 19–32)
Calcium: 9.9 mg/dL (ref 8.4–10.5)
Chloride: 100 mEq/L (ref 96–112)
Creatinine, Ser: 0.94 mg/dL (ref 0.40–1.20)
GFR: 62.05 mL/min (ref >60.00)
Glucose, Bld: 110 mg/dL — ABNORMAL HIGH (ref 70–99)
Potassium: 3.6 mEq/L (ref 3.5–5.1)
Sodium: 139 mEq/L (ref 135–145)

## 2019-02-26 LAB — FOLATE: Folate: 9.6 ng/mL (ref >5.9)

## 2019-02-26 LAB — TSH: TSH: 0.77 u[IU]/mL (ref 0.35–4.50)

## 2019-02-26 LAB — VITAMIN B12: Vitamin B-12: 617 pg/mL (ref 211–911)

## 2019-02-26 LAB — HEMOGLOBIN A1C: Hgb A1c MFr Bld: 6.4 % (ref 4.6–6.5)

## 2019-02-26 MED ORDER — METHYLPREDNISOLONE 4 MG PO TBPK: ORAL_TABLET | ORAL | 0 refills | Status: AC

## 2019-02-26 MED ORDER — LEVOCETIRIZINE DIHYDROCHLORIDE 5 MG PO TABS: 5.0000 mg | ORAL_TABLET | Freq: Every evening | ORAL | 1 refills | Status: DC

## 2019-02-26 NOTE — Patient Instructions (Signed)
Hypothyroidism  Hypothyroidism is when the thyroid gland does not make enough of certain hormones (it is underactive). The thyroid gland is a small gland located in the lower front part of the neck, just in front of the windpipe (trachea). This gland makes hormones that help control how the body uses food for energy (metabolism) as well as how the heart and brain function. These hormones also play a role in keeping your bones strong. When the thyroid is underactive, it produces too little of the hormones thyroxine (T4) and triiodothyronine (T3). What are the causes? This condition may be caused by:  Hashimoto's disease. This is a disease in which the body's disease-fighting system (immune system) attacks the thyroid gland. This is the most common cause.  Viral infections.  Pregnancy.  Certain medicines.  Birth defects.  Past radiation treatments to the head or neck for cancer.  Past treatment with radioactive iodine.  Past exposure to radiation in the environment.  Past surgical removal of part or all of the thyroid.  Problems with a gland in the center of the brain (pituitary gland).  Lack of enough iodine in the diet. What increases the risk? You are more likely to develop this condition if:  You are female.  You have a family history of thyroid conditions.  You use a medicine called lithium.  You take medicines that affect the immune system (immunosuppressants). What are the signs or symptoms? Symptoms of this condition include:  Feeling as though you have no energy (lethargy).  Not being able to tolerate cold.  Weight gain that is not explained by a change in diet or exercise habits.  Lack of appetite.  Dry skin.  Coarse hair.  Menstrual irregularity.  Slowing of thought processes.  Constipation.  Sadness or depression. How is this diagnosed? This condition may be diagnosed based on:  Your symptoms, your medical history, and a physical exam.  Blood  tests. You may also have imaging tests, such as an ultrasound or MRI. How is this treated? This condition is treated with medicine that replaces the thyroid hormones that your body does not make. After you begin treatment, it may take several weeks for symptoms to go away. Follow these instructions at home:  Take over-the-counter and prescription medicines only as told by your health care provider.  If you start taking any new medicines, tell your health care provider.  Keep all follow-up visits as told by your health care provider. This is important. ? As your condition improves, your dosage of thyroid hormone medicine may change. ? You will need to have blood tests regularly so that your health care provider can monitor your condition. Contact a health care provider if:  Your symptoms do not get better with treatment.  You are taking thyroid replacement medicine and you: ? Sweat a lot. ? Have tremors. ? Feel anxious. ? Lose weight rapidly. ? Cannot tolerate heat. ? Have emotional swings. ? Have diarrhea. ? Feel weak. Get help right away if you have:  Chest pain.  An irregular heartbeat.  A rapid heartbeat.  Difficulty breathing. Summary  Hypothyroidism is when the thyroid gland does not make enough of certain hormones (it is underactive).  When the thyroid is underactive, it produces too little of the hormones thyroxine (T4) and triiodothyronine (T3).  The most common cause is Hashimoto's disease, a disease in which the body's disease-fighting system (immune system) attacks the thyroid gland. The condition can also be caused by viral infections, medicine, pregnancy, or past   radiation treatment to the head or neck.  Symptoms may include weight gain, dry skin, constipation, feeling as though you do not have energy, and not being able to tolerate cold.  This condition is treated with medicine to replace the thyroid hormones that your body does not make. This information  is not intended to replace advice given to you by your health care provider. Make sure you discuss any questions you have with your health care provider. Document Released: 10/15/2005 Document Revised: 09/25/2017 Document Reviewed: 09/25/2017 Elsevier Interactive Patient Education  2019 [employer]  

## 2019-02-26 NOTE — Progress Notes (Signed)
Subjective:  Patient ID: Sarah Watts, female    DOB: October 18, 1966  Age: 53 y.o. MRN: [id number]  CC: Hypertension; Allergic Rhinitis ; and Hypothyroidism   HPI Sarah Watts presents for f/up - She complains of a 2-week history of nasal congestion, runny nose, dizziness, and popping/pressure in her ears.  She did not get much symptom relief with plain Claritin so she has recently added Claritin-D.  Outpatient Medications Prior to Visit  Medication Sig Dispense Refill  . baclofen (LIORESAL) 20 MG tablet TAKE 1 TABLET BY MOUTH THREE TIMES DAILY FOR 30 DAYS  0  . Diclofenac Sodium (PENNSAID) 2 % SOLN Place 2 application onto the skin 2 (two) times daily. 112 g 3  . EDARBI 80 MG TABS TAKE 1 TABLET ONCE DAILY. 90 tablet 0  . fluticasone (FLONASE) 50 MCG/ACT nasal spray USE TWO SPRAY(S) IN EACH NOSTRIL ONCE DAILY 48 g 1  . levothyroxine (SYNTHROID, LEVOTHROID) 75 MCG tablet Take 1 tablet by mouth once daily 90 tablet 0  . loratadine (CLARITIN) 10 MG tablet Take 10 mg by mouth daily.      Marland Kitchen triamterene-hydrochlorothiazide (DYAZIDE) 37.5-25 MG capsule Take 1 capsule by mouth once daily 90 capsule 0   No facility-administered medications prior to visit.     ROS Review of Systems  Constitutional: Positive for fatigue and unexpected weight change (wt gain). Negative for chills and fever.  HENT: Positive for congestion, ear pain, postnasal drip and rhinorrhea. Negative for ear discharge, facial swelling, hearing loss, nosebleeds, sinus pressure, sneezing, sore throat, tinnitus and voice change.   Eyes: Negative.   Respiratory: Negative.  Negative for cough.   Cardiovascular: Negative for chest pain, palpitations and leg swelling.  Gastrointestinal: Negative for abdominal pain, constipation, diarrhea and vomiting.  Endocrine: Negative.   Genitourinary: Negative.  Negative for difficulty urinating.  Musculoskeletal: Negative.  Negative for arthralgias and myalgias.  Skin: Negative.    Neurological: Positive for dizziness. Negative for syncope, weakness, light-headedness and headaches.  Hematological: Negative for adenopathy. Does not bruise/bleed easily.  Psychiatric/Behavioral: Negative.     Objective:  BP (!) 142/78 (BP Location: Left Arm, Patient Position: Sitting, Cuff Size: Normal)   Pulse 87   Temp 98.3 F (36.8 C) (Oral)   Resp 16   Ht 5' (1.524 m)   Wt 174 lb 8 oz (79.2 kg)   SpO2 95%   BMI 34.08 kg/m   BP Readings from Last 3 Encounters:  02/26/19 (!) 142/78  08/28/18 130/74  08/19/18 138/80    Wt Readings from Last 3 Encounters:  02/26/19 174 lb 8 oz (79.2 kg)  08/28/18 167 lb (75.8 kg)  08/19/18 166 lb (75.3 kg)    Physical Exam Vitals signs reviewed.  Constitutional:      Appearance: She is obese. She is not ill-appearing or diaphoretic.  HENT:     Right Ear: Hearing, tympanic membrane, ear canal and external ear normal.     Left Ear: Hearing, tympanic membrane, ear canal and external ear normal.     Nose: Mucosal edema and rhinorrhea present. No nasal tenderness or congestion.     Right Nostril: No foreign body or epistaxis.     Left Nostril: No epistaxis.     Right Turbinates: Not enlarged.     Left Turbinates: Not enlarged.     Right Sinus: No maxillary sinus tenderness or frontal sinus tenderness.     Left Sinus: No maxillary sinus tenderness or frontal sinus tenderness.     Mouth/Throat:  Pharynx: Oropharynx is clear. No oropharyngeal exudate.  Eyes:     General: No scleral icterus.    Conjunctiva/sclera: Conjunctivae normal.  Neck:     Musculoskeletal: Normal range of motion and neck supple. No neck rigidity.  Cardiovascular:     Rate and Rhythm: Normal rate and regular rhythm.     Heart sounds: No murmur. No gallop.   Pulmonary:     Effort: Pulmonary effort is normal.     Breath sounds: No stridor. No wheezing, rhonchi or rales.  Abdominal:     General: Abdomen is flat.     Palpations: There is no hepatomegaly,  splenomegaly or mass.     Tenderness: There is no abdominal tenderness. There is no guarding.  Musculoskeletal: Normal range of motion.     Right lower leg: No edema.     Left lower leg: No edema.  Lymphadenopathy:     Cervical: No cervical adenopathy.  Skin:    General: Skin is warm and dry.  Neurological:     General: No focal deficit present.  Psychiatric:        Mood and Affect: Mood normal.        Behavior: Behavior normal.     Lab Results  Component Value Date   WBC 8.9 02/26/2019   HGB 14.3 02/26/2019   HCT 42.0 02/26/2019   PLT 413.0 (H) 02/26/2019   GLUCOSE 110 (H) 02/26/2019   CHOL 139 08/28/2018   TRIG 264.0 (H) 08/28/2018   HDL 35.30 (L) 08/28/2018   LDLDIRECT 82.0 08/28/2018   LDLCALC 118 (H) 03/12/2016   ALT 22 03/18/2017   AST 19 03/18/2017   NA 139 02/26/2019   K 3.6 02/26/2019   CL 100 02/26/2019   CREATININE 0.94 02/26/2019   BUN 14 02/26/2019   CO2 30 02/26/2019   TSH 0.77 02/26/2019   HGBA1C 6.4 02/26/2019    Mm Digital Screening Bilateral  Result Date: 04/02/2017 CLINICAL DATA:  Screening. EXAM: DIGITAL SCREENING BILATERAL MAMMOGRAM WITH CAD COMPARISON:  Previous exam(s). ACR Breast Density Category b: There are scattered areas of fibroglandular density. FINDINGS: There are no findings suspicious for malignancy. Images were processed with CAD. IMPRESSION: No mammographic evidence of malignancy. A result letter of this screening mammogram will be mailed directly to the patient. RECOMMENDATION: Screening mammogram in one year. (Code:SM-B-01Y) BI-RADS CATEGORY  1: Negative. Electronically Signed   By: Everlean Alstrom M.D.   On: 04/02/2017 08:15    Assessment & Plan:   Sarah Watts was seen today for hypertension, allergic rhinitis  and hypothyroidism.  Diagnoses and all orders for this visit:  Essential hypertension - Her SBP is not well controlled. She agrees to stop taking the decongestant. Will cont the current combination of an ARB and diuretics.  -     Basic metabolic panel; Future  Thrombocytosis ([place])- Her PLT count is stable. No complications noted. -     CBC with Differential/Platelet; Future -     Folate; Future -     Vitamin B12; Future  B12 nutritional deficiency- Her B12 is normal now. -     CBC with Differential/Platelet; Future  Prediabetes- Her A1C is up to 6.4%. Medical therapy is not indicated. I encouraged her to improve her lifestyle modifications. -     Hemoglobin A1c; Future -     Folate; Future -     Vitamin B12; Future  Seasonal allergic rhinitis due to pollen -     levocetirizine (XYZAL) 5 MG tablet; Take 1 tablet (5  mg total) by mouth every evening. -     methylPREDNISolone (MEDROL DOSEPAK) 4 MG TBPK tablet; TAKE AS DIRECTED  Dysfunction of both eustachian tubes -     levocetirizine (XYZAL) 5 MG tablet; Take 1 tablet (5 mg total) by mouth every evening. -     methylPREDNISolone (MEDROL DOSEPAK) 4 MG TBPK tablet; TAKE AS DIRECTED  Hypothyroidism, unspecified type- Her TSH is in the normal range. She will stay on the current T4 dose. -     TSH; Future   I am having Sarah Watts start on levocetirizine and methylPREDNISolone. I am also having her maintain her loratadine, Diclofenac Sodium, fluticasone, baclofen, Edarbi, levothyroxine, and triamterene-hydrochlorothiazide.  Meds ordered this encounter  Medications  . levocetirizine (XYZAL) 5 MG tablet    Sig: Take 1 tablet (5 mg total) by mouth every evening.    Dispense:  90 tablet    Refill:  1  . methylPREDNISolone (MEDROL DOSEPAK) 4 MG TBPK tablet    Sig: TAKE AS DIRECTED    Dispense:  21 tablet    Refill:  0     Follow-up: Return in about 6 months (around 08/28/2019).  Scarlette Calico, MD

## 2019-03-27 ENCOUNTER — Other Ambulatory Visit: Payer: BLUE CROSS/BLUE SHIELD

## 2019-03-27 ENCOUNTER — Other Ambulatory Visit: Payer: Self-pay

## 2019-03-27 ENCOUNTER — Encounter: Payer: Self-pay | Admitting: Family

## 2019-03-27 ENCOUNTER — Ambulatory Visit: Payer: Self-pay | Admitting: Internal Medicine

## 2019-03-27 ENCOUNTER — Ambulatory Visit (INDEPENDENT_AMBULATORY_CARE_PROVIDER_SITE_OTHER): Payer: BLUE CROSS/BLUE SHIELD | Admitting: Family

## 2019-03-27 ENCOUNTER — Telehealth: Payer: BLUE CROSS/BLUE SHIELD | Admitting: Family

## 2019-03-27 ENCOUNTER — Telehealth: Payer: Self-pay | Admitting: Family

## 2019-03-27 DIAGNOSIS — R52 Pain, unspecified: Secondary | ICD-10-CM

## 2019-03-27 DIAGNOSIS — R197 Diarrhea, unspecified: Secondary | ICD-10-CM | POA: Diagnosis not present

## 2019-03-27 DIAGNOSIS — R0602 Shortness of breath: Secondary | ICD-10-CM

## 2019-03-27 DIAGNOSIS — Z20822 Contact with and (suspected) exposure to covid-19: Secondary | ICD-10-CM

## 2019-03-27 DIAGNOSIS — Z20828 Contact with and (suspected) exposure to other viral communicable diseases: Secondary | ICD-10-CM | POA: Diagnosis not present

## 2019-03-27 DIAGNOSIS — R05 Cough: Secondary | ICD-10-CM

## 2019-03-27 DIAGNOSIS — R110 Nausea: Secondary | ICD-10-CM | POA: Diagnosis not present

## 2019-03-27 DIAGNOSIS — R059 Cough, unspecified: Secondary | ICD-10-CM

## 2019-03-27 DIAGNOSIS — J069 Acute upper respiratory infection, unspecified: Secondary | ICD-10-CM

## 2019-03-27 DIAGNOSIS — Z7189 Other specified counseling: Secondary | ICD-10-CM

## 2019-03-27 MED ORDER — ALBUTEROL SULFATE HFA 108 (90 BASE) MCG/ACT IN AERS: 2.0000 | INHALATION_SPRAY | Freq: Four times a day (QID) | RESPIRATORY_TRACT | 0 refills | Status: DC | PRN

## 2019-03-27 MED ORDER — BENZONATATE 100 MG PO CAPS: 100.0000 mg | ORAL_CAPSULE | Freq: Three times a day (TID) | ORAL | 0 refills | Status: DC | PRN

## 2019-03-27 MED ORDER — ONDANSETRON 4 MG PO TBDP: 4.0000 mg | ORAL_TABLET | Freq: Three times a day (TID) | ORAL | 0 refills | Status: DC | PRN

## 2019-03-27 NOTE — Telephone Encounter (Signed)
Pt. Reports she started feeling bad 4 days ago. Nausea, diarrhea, dry cough. No fever. Employer sent her home and she states they told her to get tested for COVID 19. Warm transfer to Sam in the practice for a virtual visit.  Answer Assessment - Initial Assessment Questions 1. COVID-19 DIAGNOSIS: "Who made your Coronavirus (COVID-19) diagnosis?" "Was it confirmed by a positive lab test?" If not diagnosed by a HCP, ask "Are there lots of cases (community spread) where you live?" (See public health department website, if unsure)   * MAJOR community spread: high number of cases; numbers of cases are increasing; many people hospitalized.   * MINOR community spread: low number of cases; not increasing; few or no people hospitalized     No 2. ONSET: "When did the COVID-19 symptoms start?"      Started 4 days ago 3. WORST SYMPTOM: "What is your worst symptom?" (e.g., cough, fever, shortness of breath, muscle aches)     Nausea 4. COUGH: "Do you have a cough?" If so, ask: "How bad is the cough?"       Yes - dry 5. FEVER: "Do you have a fever?" If so, ask: "What is your temperature, how was it measured, and when did it start?"     No 6. RESPIRATORY STATUS: "Describe your breathing?" (e.g., shortness of breath, wheezing, unable to speak)      No 7. BETTER-SAME-WORSE: "Are you getting better, staying the same or getting worse compared to yesterday?"  If getting worse, ask, "In what way?"     Same 8. HIGH RISK DISEASE: "Do you have any chronic medical problems?" (e.g., asthma, heart or lung disease, weak immune system, etc.)     [place] 9. PREGNANCY: "Is there any chance you are pregnant?" "When was your last menstrual period?"     No 10. OTHER SYMPTOMS: "Do you have any other symptoms?"  (e.g., runny nose, headache, sore throat, loss of smell)       Nausea, diarrhea  Protocols used: CORONAVIRUS (COVID-19) DIAGNOSED OR SUSPECTED-A-AH

## 2019-03-27 NOTE — Telephone Encounter (Signed)
Jodi Mourning, FNP called to schedule this patient for the covid-19 test. The patient has been experiencing body aches and diarrhea, along with other chronic conditions.  Pt notified to schedule an appointment today at the [facility] site at 3:15. Pt advised to wear a mask, stay in the car with the windows rolled up until ready for testing. This is a drive thru testing site. Pt voiced understanding.

## 2019-03-27 NOTE — Progress Notes (Signed)
Greater than 5 minutes, yet less than 10 minutes of time have been spent researching, coordinating, and implementing care for this patient today.  Thank you for the details you included in the comment boxes. Those details are very helpful in determining the best course of treatment for you and help [place] to provide the best care.  E-Visit for Corona Virus Screening  Based on your current symptoms, you may very well have the virus, however your symptoms are mild. Currently, not all patients are being tested. If the symptoms are mild and there is not a known exposure, performing the test is not indicated.  You have been enrolled in [facility] for COVID-19. Daily you will receive a questionnaire within the [facility] website. Our COVID-19 response team will be monitoring your responses daily.   Coronavirus disease 2019 (COVID-19) is a respiratory illness that can spread from person to person. The virus that causes COVID-19 is a new virus that was first identified in the country of [place] but is now found in multiple other countries and has spread to the [place].  Symptoms associated with the virus are mild to severe fever, cough, and shortness of breath. There is currently no vaccine to protect against COVID-19, and there is no specific antiviral treatment for the virus.   To be considered HIGH RISK for Coronavirus (COVID-19), you have to meet the following criteria:  . Traveled to [place], [place], [place], [place] or [place]; or in the [place] to [place], [place], [place], or [place]; and have fever, cough, and shortness of breath within the last 2 weeks of travel OR  . Been in close contact with a person diagnosed with COVID-19 within the last 2 weeks and have fever, cough, and shortness of breath  . IF YOU DO NOT MEET THESE CRITERIA, YOU ARE CONSIDERED LOW RISK FOR COVID-19.   It is vitally important that if you feel that you have an infection such as this virus or any  other virus that you stay home and away from places where you may spread it to others.  You should self-quarantine for 14 days if you have symptoms that could potentially be coronavirus and avoid contact with people age 1 and older.   You can use medication such as A prescription cough medication called Tessalon Perles 100 mg. You may take 1-2 capsules every 8 hours as needed for cough and A prescription inhaler called Albuterol MDI 90 mcg /actuation 2 puffs every 4 hours as needed for shortness of breath, wheezing, cough  You may also take acetaminophen (Tylenol) as needed for fever.   Reduce your risk of any infection by using the same precautions used for avoiding the common cold or flu:  Marland Kitchen Wash your hands often with soap and warm water for at least 20 seconds.  If soap and water are not readily available, use an alcohol-based hand sanitizer with at least 60% alcohol.  . If coughing or sneezing, cover your mouth and nose by coughing or sneezing into the elbow areas of your shirt or coat, into a tissue or into your sleeve (not your hands). . Avoid shaking hands with others and consider head nods or verbal greetings only. . Avoid touching your eyes, nose, or mouth with unwashed hands.  . Avoid close contact with people who are sick. . Avoid places or events with large numbers of people in one location, like concerts or sporting events. . Carefully consider travel plans you have or are  making. . If you are planning any travel outside or inside the [place], visit the CDC's Travelers' Health webpage for the latest health notices. . If you have some symptoms but not all symptoms, continue to monitor at home and seek medical attention if your symptoms worsen. . If you are having a medical emergency, call 911.  HOME CARE . Only take medications as instructed by your medical team. . Drink plenty of fluids and get plenty of rest. . A steam or ultrasonic humidifier can help if you have congestion.   GET  HELP RIGHT AWAY IF: . You develop worsening fever. . You become short of breath . You cough up blood. . Your symptoms become more severe MAKE SURE YOU   Understand these instructions.  Will watch your condition.  Will get help right away if you are not doing well or get worse.  Your e-visit answers were reviewed by a board certified advanced clinical practitioner to complete your personal care plan.  Depending on the condition, your plan could have included both over the counter or prescription medications.  If there is a problem please reply once you have received a response from your provider. Your safety is important to [place].  If you have drug allergies check your prescription carefully.    You can use MyChart to ask questions about today's visit, request a non-urgent call back, or ask for a work or school excuse for 24 hours related to this e-Visit. If it has been greater than 24 hours you will need to follow up with your provider, or enter a new e-Visit to address those concerns. You will get an e-mail in the next two days asking about your experience.  I hope that your e-visit has been valuable and will speed your recovery. Thank you for using e-visits.

## 2019-03-27 NOTE — Progress Notes (Signed)
Sarah Watts is a 53 y.o. female with the following history as recorded in [facility]:  Patient Active Problem List   Diagnosis Date Noted  . Dysfunction of both eustachian tubes 02/26/2019  . AC (acromioclavicular) arthritis 04/23/2017  . Thrombocytosis ([place]) 04/04/2017  . B12 nutritional deficiency 04/04/2017  . Current severe episode of major depressive disorder without psychotic features without prior episode ([place]) 03/18/2017  . Routine general medical examination at a health care facility 03/12/2016  . Tobacco abuse 12/01/2015  . Prediabetes 03/03/2013  . Visit for screening mammogram 02/13/2013  . Spinal stenosis in cervical region 02/13/2013  . Hypothyroid 01/18/2011  . OBSTRUCTIVE SLEEP APNEA 01/09/2011  . Essential hypertension 12/19/2010  . Allergic rhinitis 12/19/2010    Current Outpatient Medications  Medication Sig Dispense Refill  . albuterol (VENTOLIN HFA) 108 (90 Base) MCG/ACT inhaler Inhale 2 puffs into the lungs every 6 (six) hours as needed for wheezing or shortness of breath. 1 Inhaler 0  . baclofen (LIORESAL) 20 MG tablet TAKE 1 TABLET BY MOUTH THREE TIMES DAILY FOR 30 DAYS  0  . benzonatate (TESSALON PERLES) 100 MG capsule Take 1-2 capsules (100-200 mg total) by mouth every 8 (eight) hours as needed for cough. 30 capsule 0  . Diclofenac Sodium (PENNSAID) 2 % SOLN Place 2 application onto the skin 2 (two) times daily. 112 g 3  . EDARBI 80 MG TABS TAKE 1 TABLET ONCE DAILY. 90 tablet 0  . fluticasone (FLONASE) 50 MCG/ACT nasal spray USE TWO SPRAY(S) IN EACH NOSTRIL ONCE DAILY 48 g 1  . levocetirizine (XYZAL) 5 MG tablet Take 1 tablet (5 mg total) by mouth every evening. 90 tablet 1  . levothyroxine (SYNTHROID, LEVOTHROID) 75 MCG tablet Take 1 tablet by mouth once daily 90 tablet 0  . loratadine (CLARITIN) 10 MG tablet Take 10 mg by mouth daily.      Marland Kitchen triamterene-hydrochlorothiazide (DYAZIDE) 37.5-25 MG capsule Take 1 capsule by mouth once daily 90 capsule 0   No  current facility-administered medications for this visit.     Allergies: Shellfish allergy; Codeine; and Tramadol  Past Medical History:  Diagnosis Date  . Allergic rhinitis   . Heart murmur   . HTN (hypertension)   . Irregular heartbeat     Past Surgical History:  Procedure Laterality Date  . APPENDECTOMY    . CESAREAN SECTION    . CHOLECYSTECTOMY    . FACIAL RECONSTRUCTION SURGERY     Left, due to birth defect  . TONSILLECTOMY      Family History  Problem Relation Age of Onset  . Diabetes Other        1st degree relative  . Hypertension Other   . Prostate cancer Other        1st degree relative <50  . Allergies Mother   . Breast cancer Mother   . Allergies Father   . Heart disease Father   . Allergies Sister   . Asthma Sister   . Colon cancer Neg Hx     Social History   Tobacco Use  . Smoking status: Current Every Day Smoker    Packs/day: 0.50    Years: 15.00    Pack years: 7.50    Types: Cigarettes    Last attempt to quit: 06/29/2013    Years since quitting: 5.7  . Smokeless tobacco: Never Used  Substance Use Topics  . Alcohol use: No    Subjective:    I connected with Sarah Watts on 03/27/19 at  2:00 PM EDT by a video enabled telemedicine application and verified that I am speaking with the correct person using two identifiers.   I discussed the limitations of evaluation and management by telemedicine and the availability of in person appointments. The patient expressed understanding and agreed to proceed.  Patient is concerned for possible COVID; complaining of 4 day history of nausea, diarrhea, body aches; works at [employer] close friend has been diagnosed; was told by her employer that she must get test before she could return to work; denies temperature above 100.4; states that "she just feels really bad." Denies any chest pain or shortness of breath;     Objective:  There were no vitals filed for this visit.  General: Well developed, well  nourished, in no acute distress  Head: Normocephalic and atraumatic  Lungs: Respirations unlabored; Neurologic: Alert and oriented; speech intact; face symmetrical;   Assessment:  1. Suspected Covid-19 Virus Infection     Plan:  Due to risk factors and exposure to positive case, do feel that testing is appropriate. She is scheduled for testing at [facility] drive through later today; she understand to remain quarantined until test results are obtained; to the ER if develops difficulty breathing/ shortness of breath; symptomatic treatment discussed; will call in Zofran to help with nausea; follow-up to be determined.   No follow-ups on file.  No orders of the defined types were placed in this encounter.   Requested Prescriptions    No prescriptions requested or ordered in this encounter

## 2019-03-29 LAB — NOVEL CORONAVIRUS, NAA: SARS-CoV-2, NAA: NOT DETECTED

## 2019-04-09 ENCOUNTER — Telehealth: Payer: Self-pay | Admitting: Family

## 2019-04-09 NOTE — Telephone Encounter (Signed)
Patient came by the office asking if a note can be written stating that she can go back to work. She was possibly exposed to COVID-19 and was tested. Her job required to stay out of work for 2 weeks and to provide proof of testing and results. Can a letter be written? She is needing to return to work 04/10/2019 at 1:00pm. She would like to pick this up if possible.  Patient can be reached at [phone].

## 2019-04-10 ENCOUNTER — Encounter: Payer: Self-pay | Admitting: Family

## 2019-04-10 NOTE — Telephone Encounter (Signed)
Note given to patient as requested.

## 2019-04-26 ENCOUNTER — Other Ambulatory Visit: Payer: Self-pay | Admitting: Internal Medicine

## 2019-04-26 DIAGNOSIS — E876 Hypokalemia: Secondary | ICD-10-CM

## 2019-04-26 DIAGNOSIS — I10 Essential (primary) hypertension: Secondary | ICD-10-CM

## 2019-04-26 DIAGNOSIS — E039 Hypothyroidism, unspecified: Secondary | ICD-10-CM

## 2019-04-27 ENCOUNTER — Other Ambulatory Visit: Payer: Self-pay | Admitting: Internal Medicine

## 2019-04-27 DIAGNOSIS — E039 Hypothyroidism, unspecified: Secondary | ICD-10-CM

## 2019-04-27 DIAGNOSIS — E876 Hypokalemia: Secondary | ICD-10-CM

## 2019-04-27 DIAGNOSIS — I10 Essential (primary) hypertension: Secondary | ICD-10-CM

## 2019-04-27 MED ORDER — FLUTICASONE PROPIONATE 50 MCG/ACT NA SUSP: NASAL | 1 refills | Status: DC

## 2019-04-27 MED ORDER — LEVOTHYROXINE SODIUM 75 MCG PO TABS: 75.0000 ug | ORAL_TABLET | Freq: Every day | ORAL | 0 refills | Status: DC

## 2019-04-27 MED ORDER — TRIAMTERENE-HCTZ 37.5-25 MG PO CAPS: 1.0000 | ORAL_CAPSULE | Freq: Every day | ORAL | 0 refills | Status: DC

## 2019-07-01 ENCOUNTER — Other Ambulatory Visit: Payer: Self-pay | Admitting: Internal Medicine

## 2019-07-01 DIAGNOSIS — I10 Essential (primary) hypertension: Secondary | ICD-10-CM

## 2019-07-21 ENCOUNTER — Other Ambulatory Visit: Payer: Self-pay | Admitting: Internal Medicine

## 2019-07-21 DIAGNOSIS — E039 Hypothyroidism, unspecified: Secondary | ICD-10-CM

## 2019-07-22 ENCOUNTER — Other Ambulatory Visit: Payer: Self-pay | Admitting: Internal Medicine

## 2019-07-22 DIAGNOSIS — E039 Hypothyroidism, unspecified: Secondary | ICD-10-CM

## 2019-07-31 ENCOUNTER — Other Ambulatory Visit: Payer: Self-pay | Admitting: Internal Medicine

## 2019-07-31 DIAGNOSIS — E876 Hypokalemia: Secondary | ICD-10-CM

## 2019-07-31 DIAGNOSIS — I10 Essential (primary) hypertension: Secondary | ICD-10-CM

## 2019-08-05 ENCOUNTER — Telehealth: Payer: BLUE CROSS/BLUE SHIELD | Admitting: Family

## 2019-08-05 DIAGNOSIS — Z20822 Contact with and (suspected) exposure to covid-19: Secondary | ICD-10-CM

## 2019-08-05 MED ORDER — ALBUTEROL SULFATE HFA 108 (90 BASE) MCG/ACT IN AERS: 2.0000 | INHALATION_SPRAY | Freq: Four times a day (QID) | RESPIRATORY_TRACT | 0 refills | Status: DC | PRN

## 2019-08-05 MED ORDER — BENZONATATE 100 MG PO CAPS: 100.0000 mg | ORAL_CAPSULE | Freq: Three times a day (TID) | ORAL | 0 refills | Status: DC | PRN

## 2019-08-05 NOTE — Progress Notes (Signed)
E-Visit for Corona Virus Screening   Your current symptoms could be consistent with the coronavirus.  Many health care providers can now test patients at their office but not all are.  [facility] has multiple testing sites. For information on our COVID testing locations and hours go to [email or web address]  Please quarantine yourself while awaiting your test results.  We are enrolling you in our [facility] for [facility] . Daily you will receive a questionnaire within the [facility] website. Our COVID 19 response team willl be monitoriing your responses daily.  You can go to one of the  testing sites listed below, while they are opened (see hours). You do not need an order and will stay in your car during the test. You do need to self isolate until your results return and if positive 14 days from when your symptoms started and until you are 3 days symptom free.   Testing Locations (Monday - Friday, 8 a.m. - 3:30 p.m.) . [place]: [facility] at [facility], [address] [place], [place]: [place], [place], [place], [place] (entrance [address])  . [facility]: (Closed each Monday): Testing site relocated to the short stay covered drive at [facility]. (Use the [facility] entrance to [facility] next to [place] is a respiratory illness with symptoms that are similar to the flu. Symptoms are typically mild to moderate, but there have been cases of severe illness and death due to the virus. The following symptoms may appear 2-14 days after exposure: . Fever . Cough . Shortness of breath or difficulty breathing . Chills . Repeated shaking with chills . Muscle pain . Headache . Sore throat . New loss of taste or smell . Fatigue . Congestion or runny nose . Nausea or vomiting . Diarrhea  It is vitally important that if you feel that you  have an infection such as this virus or any other virus that you stay home and away from places where you may spread it to others.  You should self-quarantine for 14 days if you have symptoms that could potentially be coronavirus or have been in close contact a with a person diagnosed with COVID-19 within the last 2 weeks. You should avoid contact with people age 65 and older.   You should wear a mask or cloth face covering over your nose and mouth if you must be around other people or animals, including pets (even at home). Try to stay at least 6 feet away from other people. This will protect the people around you.  You can use medication such as A prescription cough medication called Tessalon Perles 100 mg. You may take 1-2 capsules every 8 hours as needed for cough and A prescription inhaler called Albuterol MDI 90 mcg /actuation 2 puffs every 4 hours as needed for shortness of breath, wheezing, cough  You may also take acetaminophen (Tylenol) as needed for fever.   Reduce your risk of any infection by using the same precautions used for avoiding the common cold or flu:  Marland Kitchen Wash your hands often with soap and warm water for at least 20 seconds.  If soap and water are not readily available, use an alcohol-based hand sanitizer with at least 60% alcohol.  . If coughing or sneezing, cover your mouth and nose by coughing or sneezing into the elbow areas of your shirt or coat, into a tissue or  into your sleeve (not your hands). . Avoid shaking hands with others and consider head nods or verbal greetings only. . Avoid touching your eyes, nose, or mouth with unwashed hands.  . Avoid close contact with people who are sick. . Avoid places or events with large numbers of people in one location, like concerts or sporting events. . Carefully consider travel plans you have or are making. . If you are planning any travel outside or inside the [place], visit the CDC's Travelers' Health webpage for the latest health  notices. . If you have some symptoms but not all symptoms, continue to monitor at home and seek medical attention if your symptoms worsen. . If you are having a medical emergency, call 911.  HOME CARE . Only take medications as instructed by your medical team. . Drink plenty of fluids and get plenty of rest. . A steam or ultrasonic humidifier can help if you have congestion.   GET HELP RIGHT AWAY IF YOU HAVE EMERGENCY WARNING SIGNS** FOR COVID-19. If you or someone is showing any of these signs seek emergency medical care immediately. Call 911 or proceed to your closest emergency facility if: . You develop worsening high fever. . Trouble breathing . Bluish lips or face . Persistent pain or pressure in the chest . New confusion . Inability to wake or stay awake . You cough up blood. . Your symptoms become more severe  **This list is not all possible symptoms. Contact your medical provider for any symptoms that are sever or concerning to you.   MAKE SURE YOU   Understand these instructions.  Will watch your condition.  Will get help right away if you are not doing well or get worse.  Your e-visit answers were reviewed by a board certified advanced clinical practitioner to complete your personal care plan.  Depending on the condition, your plan could have included both over the counter or prescription medications.  If there is a problem please reply once you have received a response from your provider.  Your safety is important to [place].  If you have drug allergies check your prescription carefully.    You can use MyChart to ask questions about today's visit, request a non-urgent call back, or ask for a work or school excuse for 24 hours related to this e-Visit. If it has been greater than 24 hours you will need to follow up with your provider, or enter a new e-Visit to address those concerns. You will get an e-mail in the next two days asking about your experience.  I hope that your  e-visit has been valuable and will speed your recovery. Thank you for  using e-visits.   Approximately 5 minutes was spent documenting and reviewing patient's chart.

## 2019-08-06 ENCOUNTER — Other Ambulatory Visit: Payer: Self-pay

## 2019-08-06 ENCOUNTER — Encounter (INDEPENDENT_AMBULATORY_CARE_PROVIDER_SITE_OTHER): Payer: Self-pay

## 2019-08-06 DIAGNOSIS — Z20822 Contact with and (suspected) exposure to covid-19: Secondary | ICD-10-CM

## 2019-08-07 ENCOUNTER — Encounter (INDEPENDENT_AMBULATORY_CARE_PROVIDER_SITE_OTHER): Payer: Self-pay

## 2019-08-08 ENCOUNTER — Encounter (INDEPENDENT_AMBULATORY_CARE_PROVIDER_SITE_OTHER): Payer: Self-pay

## 2019-08-08 LAB — NOVEL CORONAVIRUS, NAA: SARS-CoV-2, NAA: NOT DETECTED

## 2019-08-09 ENCOUNTER — Encounter (INDEPENDENT_AMBULATORY_CARE_PROVIDER_SITE_OTHER): Payer: Self-pay

## 2019-08-10 ENCOUNTER — Encounter (INDEPENDENT_AMBULATORY_CARE_PROVIDER_SITE_OTHER): Payer: Self-pay

## 2019-08-11 ENCOUNTER — Encounter (INDEPENDENT_AMBULATORY_CARE_PROVIDER_SITE_OTHER): Payer: Self-pay

## 2019-08-12 ENCOUNTER — Encounter (INDEPENDENT_AMBULATORY_CARE_PROVIDER_SITE_OTHER): Payer: Self-pay

## 2019-08-13 ENCOUNTER — Encounter (INDEPENDENT_AMBULATORY_CARE_PROVIDER_SITE_OTHER): Payer: Self-pay

## 2019-08-14 ENCOUNTER — Encounter (INDEPENDENT_AMBULATORY_CARE_PROVIDER_SITE_OTHER): Payer: Self-pay

## 2019-08-16 ENCOUNTER — Encounter (INDEPENDENT_AMBULATORY_CARE_PROVIDER_SITE_OTHER): Payer: Self-pay

## 2019-10-07 ENCOUNTER — Other Ambulatory Visit: Payer: Self-pay | Admitting: Internal Medicine

## 2019-10-07 DIAGNOSIS — I10 Essential (primary) hypertension: Secondary | ICD-10-CM

## 2019-10-08 ENCOUNTER — Other Ambulatory Visit (HOSPITAL_COMMUNITY)
Admission: RE | Admit: 2019-10-08 | Discharge: 2019-10-08 | Disposition: A | Discharge status: Home / Self Care | Payer: BC Managed Care – PPO | Source: Ambulatory Visit | Attending: Internal Medicine | Admitting: Internal Medicine

## 2019-10-08 ENCOUNTER — Other Ambulatory Visit: Payer: BC Managed Care – PPO

## 2019-10-08 ENCOUNTER — Encounter: Payer: Self-pay | Admitting: Internal Medicine

## 2019-10-08 ENCOUNTER — Ambulatory Visit (INDEPENDENT_AMBULATORY_CARE_PROVIDER_SITE_OTHER): Payer: BC Managed Care – PPO | Admitting: Internal Medicine

## 2019-10-08 ENCOUNTER — Other Ambulatory Visit (INDEPENDENT_AMBULATORY_CARE_PROVIDER_SITE_OTHER): Payer: BC Managed Care – PPO

## 2019-10-08 ENCOUNTER — Other Ambulatory Visit: Payer: Self-pay

## 2019-10-08 VITALS — BP 144/78 | HR 63 | Temp 98.1°F | Resp 16 | Ht 60.0 in | Wt 176.0 lb

## 2019-10-08 DIAGNOSIS — Z0000 Encounter for general adult medical examination without abnormal findings: Secondary | ICD-10-CM | POA: Diagnosis not present

## 2019-10-08 DIAGNOSIS — E039 Hypothyroidism, unspecified: Secondary | ICD-10-CM | POA: Diagnosis not present

## 2019-10-08 DIAGNOSIS — E538 Deficiency of other specified B group vitamins: Secondary | ICD-10-CM

## 2019-10-08 DIAGNOSIS — Z1211 Encounter for screening for malignant neoplasm of colon: Secondary | ICD-10-CM | POA: Insufficient documentation

## 2019-10-08 DIAGNOSIS — Z124 Encounter for screening for malignant neoplasm of cervix: Secondary | ICD-10-CM

## 2019-10-08 DIAGNOSIS — D75839 Thrombocytosis, unspecified: Secondary | ICD-10-CM

## 2019-10-08 DIAGNOSIS — T502X5A Adverse effect of carbonic-anhydrase inhibitors, benzothiadiazides and other diuretics, initial encounter: Secondary | ICD-10-CM

## 2019-10-08 DIAGNOSIS — D473 Essential (hemorrhagic) thrombocythemia: Secondary | ICD-10-CM | POA: Diagnosis not present

## 2019-10-08 DIAGNOSIS — R7303 Prediabetes: Secondary | ICD-10-CM | POA: Diagnosis not present

## 2019-10-08 DIAGNOSIS — I10 Essential (primary) hypertension: Secondary | ICD-10-CM

## 2019-10-08 DIAGNOSIS — E876 Hypokalemia: Secondary | ICD-10-CM | POA: Insufficient documentation

## 2019-10-08 DIAGNOSIS — E559 Vitamin D deficiency, unspecified: Secondary | ICD-10-CM

## 2019-10-08 DIAGNOSIS — Z1231 Encounter for screening mammogram for malignant neoplasm of breast: Secondary | ICD-10-CM

## 2019-10-08 DIAGNOSIS — H6983 Other specified disorders of Eustachian tube, bilateral: Secondary | ICD-10-CM

## 2019-10-08 LAB — HEMOGLOBIN A1C: Hgb A1c MFr Bld: 6.3 % (ref 4.6–6.5)

## 2019-10-08 LAB — LIPID PANEL
Cholesterol: 159 mg/dL (ref 0–200)
HDL: 38.6 mg/dL — ABNORMAL LOW (ref >39.00)
LDL Cholesterol: 93 mg/dL (ref 0–99)
NonHDL: 120.51
Total CHOL/HDL Ratio: 4
Triglycerides: 136 mg/dL (ref 0.0–149.0)
VLDL: 27.2 mg/dL (ref 0.0–40.0)

## 2019-10-08 LAB — BASIC METABOLIC PANEL
BUN: 13 mg/dL (ref 6–23)
CO2: 28 mEq/L (ref 19–32)
Calcium: 9.8 mg/dL (ref 8.4–10.5)
Chloride: 102 mEq/L (ref 96–112)
Creatinine, Ser: 0.98 mg/dL (ref 0.40–1.20)
GFR: 59 mL/min — ABNORMAL LOW (ref >60.00)
Glucose, Bld: 121 mg/dL — ABNORMAL HIGH (ref 70–99)
Potassium: 3.4 mEq/L — ABNORMAL LOW (ref 3.5–5.1)
Sodium: 140 mEq/L (ref 135–145)

## 2019-10-08 LAB — URINALYSIS, ROUTINE W REFLEX MICROSCOPIC
Bilirubin Urine: NEGATIVE
Hgb urine dipstick: NEGATIVE
Ketones, ur: NEGATIVE
Leukocytes,Ua: NEGATIVE
Nitrite: NEGATIVE
RBC / HPF: NONE SEEN (ref 0–?)
Specific Gravity, Urine: 1.01 (ref 1.000–1.030)
Total Protein, Urine: NEGATIVE
Urine Glucose: NEGATIVE
Urobilinogen, UA: 0.2 (ref 0.0–1.0)
pH: 6.5 (ref 5.0–8.0)

## 2019-10-08 LAB — CBC WITH DIFFERENTIAL/PLATELET
Basophils Absolute: 0.1 10*3/uL (ref 0.0–0.1)
Basophils Relative: 0.7 % (ref 0.0–3.0)
Eosinophils Absolute: 0.2 10*3/uL (ref 0.0–0.7)
Eosinophils Relative: 2.5 % (ref 0.0–5.0)
HCT: 39.8 % (ref 36.0–46.0)
Hemoglobin: 13.5 g/dL (ref 12.0–15.0)
Lymphocytes Relative: 21.5 % (ref 12.0–46.0)
Lymphs Abs: 2.1 10*3/uL (ref 0.7–4.0)
MCHC: 34 g/dL (ref 30.0–36.0)
MCV: 85.6 fl (ref 78.0–100.0)
Monocytes Absolute: 0.5 10*3/uL (ref 0.1–1.0)
Monocytes Relative: 5.7 % (ref 3.0–12.0)
Neutro Abs: 6.6 10*3/uL (ref 1.4–7.7)
Neutrophils Relative %: 69.6 % (ref 43.0–77.0)
Platelets: 419 10*3/uL — ABNORMAL HIGH (ref 150.0–400.0)
RBC: 4.64 Mil/uL (ref 3.87–5.11)
RDW: 13.4 % (ref 11.5–15.5)
WBC: 9.5 10*3/uL (ref 4.0–10.5)

## 2019-10-08 LAB — HEPATIC FUNCTION PANEL
ALT: 12 U/L (ref 0–35)
AST: 12 U/L (ref 0–37)
Albumin: 4.2 g/dL (ref 3.5–5.2)
Alkaline Phosphatase: 71 U/L (ref 39–117)
Bilirubin, Direct: 0.1 mg/dL (ref 0.0–0.3)
Total Bilirubin: 0.4 mg/dL (ref 0.2–1.2)
Total Protein: 7 g/dL (ref 6.0–8.3)

## 2019-10-08 LAB — FOLATE: Folate: 10 ng/mL (ref >5.9)

## 2019-10-08 LAB — HM PAP SMEAR

## 2019-10-08 LAB — TSH: TSH: 0.68 u[IU]/mL (ref 0.35–4.50)

## 2019-10-08 LAB — VITAMIN D 25 HYDROXY (VIT D DEFICIENCY, FRACTURES): VITD: 16.49 ng/mL — ABNORMAL LOW (ref 30.00–100.00)

## 2019-10-08 MED ORDER — EDARBI 80 MG PO TABS: 1.0000 | ORAL_TABLET | Freq: Every day | ORAL | 1 refills | Status: DC

## 2019-10-08 MED ORDER — CHOLECALCIFEROL 1.25 MG (50000 UT) PO CAPS: 50000.0000 [IU] | ORAL_CAPSULE | ORAL | 1 refills | Status: DC

## 2019-10-08 MED ORDER — LEVOTHYROXINE SODIUM 75 MCG PO TABS: 75.0000 ug | ORAL_TABLET | Freq: Every day | ORAL | 1 refills | Status: DC

## 2019-10-08 MED ORDER — POTASSIUM CHLORIDE CRYS ER 20 MEQ PO TBCR: 20.0000 meq | EXTENDED_RELEASE_TABLET | Freq: Every day | ORAL | 1 refills | Status: DC

## 2019-10-08 NOTE — Progress Notes (Signed)
Subjective:  Patient ID: Sarah Watts, female    DOB: 03-12-1966  Age: 53 y.o. MRN: [id number]  CC: Annual Exam, Hypertension, and Hypothyroidism  This visit occurred during the SARS-CoV-2 public health emergency.  Safety protocols were in place, including screening questions prior to the visit, additional usage of staff PPE, and extensive cleaning of exam room while observing appropriate contact time as indicated for disinfecting solutions.    HPI Sarah Watts presents for a CPX.  She ran out of [place] about 3 days ago and since then has complained of headache and fatigue.  She tells me her blood pressure has not been controlled over the last few days.  She is compliant with Dyazide.  She denies blurred vision, chest pain, shortness of breath, or edema.  Outpatient Medications Prior to Visit  Medication Sig Dispense Refill  . albuterol (VENTOLIN HFA) 108 (90 Base) MCG/ACT inhaler Inhale 2 puffs into the lungs every 6 (six) hours as needed for wheezing or shortness of breath. 8 g 0  . fluticasone (FLONASE) 50 MCG/ACT nasal spray Use 2 spray(s) in each nostril once daily 48 g 1  . levocetirizine (XYZAL) 5 MG tablet Take 1 tablet (5 mg total) by mouth every evening. 90 tablet 1  . triamterene-hydrochlorothiazide (DYAZIDE) 37.5-25 MG capsule Take 1 capsule by mouth once daily 90 capsule 0  . EDARBI 80 MG TABS TAKE 1 TABLET ONCE DAILY. 90 tablet 0  . EUTHYROX 75 MCG tablet Take 1 tablet by mouth once daily 90 tablet 0  . baclofen (LIORESAL) 20 MG tablet TAKE 1 TABLET BY MOUTH THREE TIMES DAILY FOR 30 DAYS  0  . benzonatate (TESSALON PERLES) 100 MG capsule Take 1 capsule (100 mg total) by mouth 3 (three) times daily as needed. 20 capsule 0  . Diclofenac Sodium (PENNSAID) 2 % SOLN Place 2 application onto the skin 2 (two) times daily. (Patient not taking: Reported on 10/08/2019) 112 g 3  . ondansetron (ZOFRAN ODT) 4 MG disintegrating tablet Take 1 tablet (4 mg total) by mouth every 8  (eight) hours as needed for nausea or vomiting. 20 tablet 0   No facility-administered medications prior to visit.    ROS Review of Systems  Constitutional: Positive for fatigue and unexpected weight change (wt gain). Negative for chills and diaphoresis.  HENT: Negative.   Eyes: Negative for visual disturbance.  Respiratory: Negative for cough, chest tightness, shortness of breath and wheezing.   Cardiovascular: Negative for chest pain, palpitations and leg swelling.  Gastrointestinal: Negative.  Negative for abdominal pain, constipation, diarrhea, nausea and vomiting.  Endocrine: Negative for cold intolerance and heat intolerance.  Genitourinary: Negative.  Negative for decreased urine volume, difficulty urinating, dysuria, hematuria, urgency, vaginal bleeding and vaginal discharge.  Musculoskeletal: Negative.  Negative for arthralgias and myalgias.  Skin: Negative.  Negative for color change and pallor.  Neurological: Positive for headaches. Negative for dizziness, tremors, weakness and light-headedness.  Hematological: Negative for adenopathy. Does not bruise/bleed easily.  Psychiatric/Behavioral: Negative.     Objective:  BP (!) 144/78 (BP Location: Left Arm, Patient Position: Sitting, Cuff Size: Large)   Pulse 63   Temp 98.1 F (36.7 C) (Oral)   Resp 16   Ht 5' (1.524 m)   Wt 176 lb (79.8 kg)   SpO2 98%   BMI 34.37 kg/m   BP Readings from Last 3 Encounters:  10/08/19 (!) 144/78  02/26/19 (!) 142/78  08/28/18 130/74    Wt Readings from Last 3 Encounters:  10/08/19 176 lb (79.8 kg)  02/26/19 174 lb 8 oz (79.2 kg)  08/28/18 167 lb (75.8 kg)    Physical Exam Vitals reviewed. Exam conducted with a chaperone present (Stefannie).  Constitutional:      Appearance: Normal appearance. She is obese.  HENT:     Nose: Nose normal.     Mouth/Throat:     Mouth: Mucous membranes are moist.  Eyes:     General: No scleral icterus.    Conjunctiva/sclera: Conjunctivae normal.   Cardiovascular:     Rate and Rhythm: Normal rate and regular rhythm.     Heart sounds: No murmur.  Pulmonary:     Effort: Pulmonary effort is normal.     Breath sounds: No stridor. No wheezing, rhonchi or rales.  Chest:     Chest wall: No mass, deformity or swelling.     Breasts: Breasts are symmetrical.        Right: Normal. No inverted nipple, mass, nipple discharge, skin change or tenderness.        Left: Normal. No inverted nipple, mass, nipple discharge, skin change or tenderness.  Abdominal:     General: Abdomen is protuberant. Bowel sounds are normal. There is no distension.     Palpations: Abdomen is soft. There is no hepatomegaly, splenomegaly or mass.     Tenderness: There is no abdominal tenderness.     Hernia: No hernia is present. There is no hernia in the left inguinal area or right inguinal area.  Genitourinary:    General: Normal vulva.     Exam position: Supine.     Pubic Area: No rash.      Labia:        Right: No rash, tenderness, lesion or injury.        Left: No rash, tenderness, lesion or injury.      Urethra: No prolapse, urethral pain, urethral swelling or urethral lesion.     Vagina: Normal. No bleeding or lesions.     Cervix: No cervical motion tenderness, discharge, lesion or cervical bleeding.     Uterus: Normal. Not enlarged and not tender.      Adnexa: Right adnexa normal and left adnexa normal.       Right: No mass, tenderness or fullness.         Left: No mass, tenderness or fullness.       Rectum: Normal. Guaiac result negative. No mass, tenderness, anal fissure, external hemorrhoid or internal hemorrhoid. Normal anal tone.  Musculoskeletal:        General: Normal range of motion.     Cervical back: Neck supple.     Right lower leg: No edema.     Left lower leg: No edema.  Lymphadenopathy:     Cervical: No cervical adenopathy.     Upper Body:     Right upper body: No supraclavicular, axillary or pectoral adenopathy.     Left upper body: No  supraclavicular, axillary or pectoral adenopathy.     Lower Body: No right inguinal adenopathy. No left inguinal adenopathy.  Skin:    General: Skin is warm and dry.     Coloration: Skin is not pale.  Neurological:     General: No focal deficit present.     Mental Status: She is alert.  Psychiatric:        Mood and Affect: Mood normal.        Behavior: Behavior normal.     Lab Results  Component Value Date   WBC  9.5 10/08/2019   HGB 13.5 10/08/2019   HCT 39.8 10/08/2019   PLT 419.0 (H) 10/08/2019   GLUCOSE 121 (H) 10/08/2019   CHOL 159 10/08/2019   TRIG 136.0 10/08/2019   HDL 38.60 (L) 10/08/2019   LDLDIRECT 82.0 08/28/2018   LDLCALC 93 10/08/2019   ALT 12 10/08/2019   AST 12 10/08/2019   NA 140 10/08/2019   K 3.4 (L) 10/08/2019   CL 102 10/08/2019   CREATININE 0.98 10/08/2019   BUN 13 10/08/2019   CO2 28 10/08/2019   TSH 0.68 10/08/2019   HGBA1C 6.3 10/08/2019    MM DIGITAL SCREENING BILATERAL  Result Date: 04/02/2017 CLINICAL DATA:  Screening. EXAM: DIGITAL SCREENING BILATERAL MAMMOGRAM WITH CAD COMPARISON:  Previous exam(s). ACR Breast Density Category b: There are scattered areas of fibroglandular density. FINDINGS: There are no findings suspicious for malignancy. Images were processed with CAD. IMPRESSION: No mammographic evidence of malignancy. A result letter of this screening mammogram will be mailed directly to the patient. RECOMMENDATION: Screening mammogram in one year. (Code:SM-B-01Y) BI-RADS CATEGORY  1: Negative. Electronically Signed   By: Everlean Alstrom M.D.   On: 04/02/2017 08:15    Assessment & Plan:   Marte was seen today for annual exam, hypertension and hypothyroidism.  Diagnoses and all orders for this visit:  Essential hypertension- Her blood pressure is not adequately well controlled.  Will restart the ARB.  She has developed hypokalemia so I have asked her to start taking a potassium supplement. -     Basic metabolic panel; Future -      Urinalysis, Routine w reflex microscopic; Future -     Vitamin D 25 hydroxy; Future -     Hepatic function panel; Future -     Discontinue: Azilsartan Medoxomil (EDARBI) 80 MG TABS; Take 1 tablet (80 mg total) by mouth daily. -     potassium chloride SA (KLOR-CON) 20 MEQ tablet; Take 1 tablet (20 mEq total) by mouth daily.  Thrombocytosis (Section)- This is asymptomatic and unchanged over the last few years.  Her other cell lines are normal.  This is a benign finding. Will continue to monitor in the future. -     CBC with Differential; Future -     Folate; Future -     Hepatic function panel; Future  B12 nutritional deficiency- She will continue parenteral B12 replacement therapy. -     CBC with Differential; Future -     Folate; Future  Prediabetes- Her A1c is at 6.3%.  She is prediabetic.  Medical therapy is not indicated.  She was encouraged to improve her lifestyle modifications. -     Basic metabolic panel; Future -     Hemoglobin A1c; Future  Routine general medical examination at a health care facility- Exam completed, labs reviewed, vaccines reviewed and updated, Pap collected and sent, she is referred for colon and breast cancer screenings, patient education was given. -     Lipid panel; Future  Acquired hypothyroidism- Her TSH is in the normal range.  She will remain on the current dose of levothyroxine. -     TSH; Future -     levothyroxine (EUTHYROX) 75 MCG tablet; Take 1 tablet (75 mcg total) by mouth daily.  Visit for screening mammogram -     MM Digital Screening; Future  Colon cancer screening -     Cologuard  Vitamin D deficiency disease -     Cholecalciferol 1.25 MG (50000 UT) capsule; Take 1 capsule (50,000 Units  total) by mouth once a week.  Diuretic-induced hypokalemia -     potassium chloride SA (KLOR-CON) 20 MEQ tablet; Take 1 tablet (20 mEq total) by mouth daily.  Dysfunction of both eustachian tubes  Cervical cancer screening -     Cytology - PAP; Future    I have discontinued Jeralene Huff. Renninger's Diclofenac Sodium, baclofen, ondansetron, Edarbi, and benzonatate. I have also changed her Euthyrox to levothyroxine. Additionally, I am having her start on Cholecalciferol and potassium chloride SA. Lastly, I am having her maintain her levocetirizine, fluticasone, triamterene-hydrochlorothiazide, and albuterol.  Meds ordered this encounter  Medications  . DISCONTD: Azilsartan Medoxomil (EDARBI) 80 MG TABS    Sig: Take 1 tablet (80 mg total) by mouth daily.    Dispense:  90 tablet    Refill:  1  . levothyroxine (EUTHYROX) 75 MCG tablet    Sig: Take 1 tablet (75 mcg total) by mouth daily.    Dispense:  90 tablet    Refill:  1  . Cholecalciferol 1.25 MG (50000 UT) capsule    Sig: Take 1 capsule (50,000 Units total) by mouth once a week.    Dispense:  12 capsule    Refill:  1  . potassium chloride SA (KLOR-CON) 20 MEQ tablet    Sig: Take 1 tablet (20 mEq total) by mouth daily.    Dispense:  90 tablet    Refill:  1     Follow-up: Return in about 6 months (around 04/07/2020).  Scarlette Calico, MD

## 2019-10-08 NOTE — Patient Instructions (Signed)
Health Maintenance, Female Adopting a healthy lifestyle and getting preventive care are important in promoting health and wellness. Ask your health care provider about:  The right schedule for you to have regular tests and exams.  Things you can do on your own to prevent diseases and keep yourself healthy. What should I know about diet, weight, and exercise? Eat a healthy diet   Eat a diet that includes plenty of vegetables, fruits, low-fat dairy products, and lean protein.  Do not eat a lot of foods that are high in solid fats, added sugars, or sodium. Maintain a healthy weight Body mass index (BMI) is used to identify weight problems. It estimates body fat based on height and weight. Your health care provider can help determine your BMI and help you achieve or maintain a healthy weight. Get regular exercise Get regular exercise. This is one of the most important things you can do for your health. Most adults should:  Exercise for at least 150 minutes each week. The exercise should increase your heart rate and make you sweat (moderate-intensity exercise).  Do strengthening exercises at least twice a week. This is in addition to the moderate-intensity exercise.  Spend less time sitting. Even light physical activity can be beneficial. Watch cholesterol and blood lipids Have your blood tested for lipids and cholesterol at 54 years of age, then have this test every 5 years. Have your cholesterol levels checked more often if:  Your lipid or cholesterol levels are high.  You are older than 54 years of age.  You are at high risk for heart disease. What should I know about cancer screening? Depending on your health history and family history, you may need to have cancer screening at various ages. This may include screening for:  Breast cancer.  Cervical cancer.  Colorectal cancer.  Skin cancer.  Lung cancer. What should I know about heart disease, diabetes, and high blood  pressure? Blood pressure and heart disease  High blood pressure causes heart disease and increases the risk of stroke. This is more likely to develop in people who have high blood pressure readings, are of African descent, or are overweight.  Have your blood pressure checked: ? Every 3-5 years if you are 18-39 years of age. ? Every year if you are 40 years old or older. Diabetes Have regular diabetes screenings. This checks your fasting blood sugar level. Have the screening done:  Once every three years after age 40 if you are at a normal weight and have a low risk for diabetes.  More often and at a younger age if you are overweight or have a high risk for diabetes. What should I know about preventing infection? Hepatitis B If you have a higher risk for hepatitis B, you should be screened for this virus. Talk with your health care provider to find out if you are at risk for hepatitis B infection. Hepatitis C Testing is recommended for:  Everyone born from 1945 through 1965.  Anyone with known risk factors for hepatitis C. Sexually transmitted infections (STIs)  Get screened for STIs, including gonorrhea and chlamydia, if: ? You are sexually active and are younger than 54 years of age. ? You are older than 54 years of age and your health care provider tells you that you are at risk for this type of infection. ? Your sexual activity has changed since you were last screened, and you are at increased risk for chlamydia or gonorrhea. Ask your health care provider if   you are at risk.  Ask your health care provider about whether you are at high risk for HIV. Your health care provider may recommend a prescription medicine to help prevent HIV infection. If you choose to take medicine to prevent HIV, you should first get tested for HIV. You should then be tested every 3 months for as long as you are taking the medicine. Pregnancy  If you are about to stop having your period (premenopausal) and  you may become pregnant, seek counseling before you get pregnant.  Take 400 to 800 micrograms (mcg) of folic acid every day if you become pregnant.  Ask for birth control (contraception) if you want to prevent pregnancy. Osteoporosis and menopause Osteoporosis is a disease in which the bones lose minerals and strength with aging. This can result in bone fractures. If you are 65 years old or older, or if you are at risk for osteoporosis and fractures, ask your health care provider if you should:  Be screened for bone loss.  Take a calcium or vitamin D supplement to lower your risk of fractures.  Be given hormone replacement therapy (HRT) to treat symptoms of menopause. Follow these instructions at home: Lifestyle  Do not use any products that contain nicotine or tobacco, such as cigarettes, e-cigarettes, and chewing tobacco. If you need help quitting, ask your health care provider.  Do not use street drugs.  Do not share needles.  Ask your health care provider for help if you need support or information about quitting drugs. Alcohol use  Do not drink alcohol if: ? Your health care provider tells you not to drink. ? You are pregnant, may be pregnant, or are planning to become pregnant.  If you drink alcohol: ? Limit how much you use to 0-1 drink a day. ? Limit intake if you are breastfeeding.  Be aware of how much alcohol is in your drink. In the [place] one drink equals one 12 oz bottle of beer (355 mL), one 5 oz glass of wine (148 mL), or one 1 oz glass of hard liquor (44 mL). General instructions  Schedule regular health, dental, and eye exams.  Stay current with your vaccines.  Tell your health care provider if: ? You often feel depressed. ? You have ever been abused or do not feel safe at home. Summary  Adopting a healthy lifestyle and getting preventive care are important in promoting health and wellness.  Follow your health care provider's instructions about healthy  diet, exercising, and getting tested or screened for diseases.  Follow your health care provider's instructions on monitoring your cholesterol and blood pressure. This information is not intended to replace advice given to you by your health care provider. Make sure you discuss any questions you have with your health care provider. Document Released: 04/30/2011 Document Revised: 10/08/2018 Document Reviewed: 10/08/2018 Elsevier Patient Education  2020 [employer] Inc.  

## 2019-10-09 MED ORDER — EDARBI 80 MG PO TABS: 1.0000 | ORAL_TABLET | Freq: Every day | ORAL | 1 refills | Status: DC

## 2019-10-12 LAB — CYTOLOGY - PAP
Adequacy: ABSENT
Comment: NEGATIVE
Diagnosis: NEGATIVE
High risk HPV: NEGATIVE

## 2019-10-13 ENCOUNTER — Encounter: Payer: Self-pay | Admitting: Internal Medicine

## 2019-10-27 ENCOUNTER — Other Ambulatory Visit: Payer: Self-pay | Admitting: Internal Medicine

## 2019-10-27 DIAGNOSIS — I10 Essential (primary) hypertension: Secondary | ICD-10-CM

## 2019-10-27 DIAGNOSIS — E876 Hypokalemia: Secondary | ICD-10-CM

## 2019-10-30 ENCOUNTER — Telehealth: Payer: BC Managed Care – PPO | Admitting: Emergency Medicine

## 2019-10-30 ENCOUNTER — Encounter: Payer: Self-pay | Admitting: Internal Medicine

## 2019-10-30 DIAGNOSIS — R0602 Shortness of breath: Secondary | ICD-10-CM

## 2019-10-30 DIAGNOSIS — B349 Viral infection, unspecified: Secondary | ICD-10-CM

## 2019-10-30 DIAGNOSIS — Z20822 Contact with and (suspected) exposure to covid-19: Secondary | ICD-10-CM

## 2019-10-30 NOTE — Progress Notes (Signed)
Patient requesting virtual treatment for possible COVID-19.  Based on answers to questionnaire, I do not feel comfortable treating patient virtually and have referred patient for in-person exam.

## 2019-10-30 NOTE — Progress Notes (Signed)
  E-Visit for [employer] Virus Screening  Based on what you have shared with me, you need to seek an evaluation for a severe illness that is causing your symptoms which may be coronavirus or some other illness.   I am very concerned about the way that you describe your symptoms.  You need to be seen in person so that you can have your oxygen level monitored, and may need to have a chest x-ray and additional work-up performed.  Covid-19 can rapidly progress, and become very dangerous quickly.  Because of this, you should be seen today.  I recommend that you be seen and evaluated "face to face". If you are considered high risk for Corona virus because of a known exposure, fever, shortness of breath and cough, OR if you have severe symptoms of any kind, seek medical care at an emergency room. Our Emergency Departments are best equipped to handle patients with severe symptoms.  You will be evaluated by the ER provider (or higher level of care provider) who will determine whether you need formal testing.  If you are having a true medical emergency please call 911.   I recommend the following:  . [facility] Emergency Department [place], [place], [place] [address] [phone]  . [facility] Emergency Department [place], [place], [place] [address] [phone]  . [facility] Emergency Department [place], [place], [place] [address] [phone]  . [facility] Emergency Department [address] [place], [place], [place] [address] [phone]  . [facility] Emergency Department [place], [place], [place] [address] [phone]  NOTE: If you entered your credit card information for this eVisit, you will not be charged. You may see a "hold" on your card for the $35 but that hold will drop off and you will not have a charge processed.   Your e-visit answers were  reviewed by a board certified advanced clinical practitioner to complete your personal care plan.  Thank you for using e-Visits.  Approximately 5 minutes was used in reviewing the patient's chart, questionnaire, prescribing medications, and documentation.

## 2019-10-31 ENCOUNTER — Telehealth: Payer: BC Managed Care – PPO | Admitting: Family

## 2019-10-31 DIAGNOSIS — Z20822 Contact with and (suspected) exposure to covid-19: Secondary | ICD-10-CM

## 2019-10-31 NOTE — Progress Notes (Signed)
E-Visit for Corona Virus Screening   Your current symptoms could be consistent with the coronavirus.  Many health care providers can now test patients at their office but not all are.  [place] has multiple testing sites. For information on our [place] testing locations and hours go to [email or web address]  We are enrolling you in our [place] for [place] . Daily you will receive a questionnaire within the [place] website. Our COVID 19 response team will be monitoring your responses daily.  Testing Information: The COVID-19 Community Testing sites will begin testing BY APPOINTMENT ONLY.  You can schedule online at [email or web address]  If you do not have access to a smart phone or computer you may call [phone] for an appointment.  Testing Locations: Appointment schedule is 8 am to 3:30 pm at all sites  [facility] indoors at [address] [place] [address] [facility]  indoors at [place]. [address] [place], [place] [address] [place] indoors at [address], [place] [address]  Additional testing sites in the Community:  . For [facility] Testing sites in [facility]  FaceUpdate.uy  . For Pop-up testing sites in [place]  BowlDirectory.co.uk  . For Testing sites with regular hours [email or web address]  . For [place] [email or web address]  . For [facility] [email or web address]  . For [facility] testing in [place] and [employer] [email or web address]  . For [facility] testing in [facility]   [email or web address]  For  more  information about community testing call [phone]   We are enrolling you in our [place] for [place] . Daily you will receive a questionnaire within the [place] website. Our COVID 19 response team will be monitoring your responses daily.  Please quarantine yourself while awaiting your test results. If you develop fever/cough/breathlessness, please stay home for 10 days with improving symptoms and until you have had 24 hours of no fever (without taking a fever reducer).  You should wear a mask or cloth face covering over your nose and mouth if you must be around other people or animals, including pets (even at home). Try to stay at least 6 feet away from other people. This will protect the people around you.  Please continue good preventive care measures, including:  frequent hand-washing, avoid touching your face, cover coughs/sneezes, stay out of crowds and keep a 6 foot distance from others.  COVID-19 is a respiratory illness with symptoms that are similar to the flu. Symptoms are typically mild to moderate, but there have been cases of severe illness and death due to the virus.   The following symptoms may appear 2-14 days after exposure: . Fever . Cough . Shortness of breath or difficulty breathing . Chills . Repeated shaking with chills . Muscle pain . Headache . Sore throat . New loss of taste or smell . Fatigue . Congestion or runny nose . Nausea or vomiting . Diarrhea  Go to the nearest hospital ED for assessment if fever/cough/breathlessness are severe or illness seems like a threat to life.  It is vitally important that if you feel that you have an infection such as this virus or any other virus that you stay home and away from places where you may spread it to others.  You should avoid contact with people age 62 and older.   You may also take acetaminophen (Tylenol) as needed for fever.  Reduce your risk of any infection by using the same precautions used for  avoiding the common cold or flu:  Marland Kitchen Wash your hands often with soap and warm water for at least 20 seconds.  If soap and water are not readily available, use an alcohol-based hand sanitizer with at least 60% alcohol.  . If coughing or sneezing, cover your mouth and nose by coughing or sneezing into the elbow areas of your shirt or coat, into a tissue or into your sleeve (not your hands). . Avoid shaking hands with others and consider head nods or verbal greetings only. . Avoid touching your eyes, nose, or mouth with unwashed hands.  . Avoid close contact with people who are sick. . Avoid places or events with large numbers of people in one location, like concerts or sporting events. . Carefully consider travel plans you have or are making. . If you are planning any travel outside or inside the [place], visit the CDC's Travelers' Health webpage for the latest health notices. . If you have some symptoms but not all symptoms, continue to monitor at home and seek medical attention if your symptoms worsen. . If you are having a medical emergency, call 911.  HOME CARE . Only take medications as instructed by your medical team. . Drink plenty of fluids and get plenty of rest. . A steam or ultrasonic humidifier can help if you have congestion.   GET HELP RIGHT AWAY IF YOU HAVE EMERGENCY WARNING SIGNS** FOR COVID-19. If you or someone is showing any of these signs seek emergency medical care immediately. Call 911 or proceed to your closest emergency facility if: . You develop worsening high fever. . Trouble breathing . Bluish lips or face . Persistent pain or pressure in the chest . New confusion . Inability to wake or stay awake . You cough up blood. . Your symptoms become more severe  **This list is not all possible symptoms. Contact your medical provider for any symptoms that are sever or concerning to you.  MAKE SURE YOU   Understand these instructions.  Will watch your condition.  Will get  help right away if you are not doing well or get worse.  Your e-visit answers were reviewed by a board certified advanced clinical practitioner to complete your personal care plan.  Depending on the condition, your plan could have included both over the counter or prescription medications.  If there is a problem please reply once you have received a response from your provider.  Your safety is important to [place].  If you have drug allergies check your prescription carefully.    You can use MyChart to ask questions about today's visit, request a non-urgent call back, or ask for a work or school excuse for 24 hours related to this e-Visit. If it has been greater than 24 hours you will need to follow up with your provider, or enter a new e-Visit to address those concerns. You will get an e-mail in the next two days asking about your experience.  I hope that your e-visit has been valuable and will speed your recovery. Thank you for using e-visits.   Greater than 5 minutes, yet less than 10 minutes of time have been spent researching, coordinating, and implementing care for this patient today.  Thank you for the details you included in the comment boxes. Those details are very helpful in determining the best course of treatment for you and help [place] to provide the best care.

## 2019-11-03 ENCOUNTER — Ambulatory Visit: Payer: BC Managed Care – PPO | Attending: Internal Medicine

## 2019-11-03 DIAGNOSIS — Z20822 Contact with and (suspected) exposure to covid-19: Secondary | ICD-10-CM

## 2019-11-05 LAB — NOVEL CORONAVIRUS, NAA: SARS-CoV-2, NAA: NOT DETECTED

## 2019-12-23 ENCOUNTER — Encounter: Payer: Self-pay | Admitting: Internal Medicine

## 2019-12-24 ENCOUNTER — Telehealth: Payer: Self-pay

## 2019-12-24 NOTE — Telephone Encounter (Signed)
Tried to lvm regarding cologuard results, pt voicemail box is full. Will contact again.

## 2020-01-24 ENCOUNTER — Other Ambulatory Visit: Payer: Self-pay | Admitting: Internal Medicine

## 2020-01-24 DIAGNOSIS — I10 Essential (primary) hypertension: Secondary | ICD-10-CM

## 2020-01-24 DIAGNOSIS — E876 Hypokalemia: Secondary | ICD-10-CM

## 2020-04-07 ENCOUNTER — Ambulatory Visit (INDEPENDENT_AMBULATORY_CARE_PROVIDER_SITE_OTHER): Payer: BC Managed Care – PPO | Admitting: Internal Medicine

## 2020-04-07 ENCOUNTER — Encounter: Payer: Self-pay | Admitting: Internal Medicine

## 2020-04-07 ENCOUNTER — Other Ambulatory Visit: Payer: Self-pay

## 2020-04-07 VITALS — BP 136/72 | HR 65 | Temp 98.3°F | Resp 16 | Ht 60.0 in | Wt 174.0 lb

## 2020-04-07 DIAGNOSIS — D473 Essential (hemorrhagic) thrombocythemia: Secondary | ICD-10-CM | POA: Diagnosis not present

## 2020-04-07 DIAGNOSIS — E559 Vitamin D deficiency, unspecified: Secondary | ICD-10-CM

## 2020-04-07 DIAGNOSIS — J418 Mixed simple and mucopurulent chronic bronchitis: Secondary | ICD-10-CM | POA: Diagnosis not present

## 2020-04-07 DIAGNOSIS — R7303 Prediabetes: Secondary | ICD-10-CM

## 2020-04-07 DIAGNOSIS — R002 Palpitations: Secondary | ICD-10-CM | POA: Diagnosis not present

## 2020-04-07 DIAGNOSIS — I10 Essential (primary) hypertension: Secondary | ICD-10-CM

## 2020-04-07 DIAGNOSIS — Z720 Tobacco use: Secondary | ICD-10-CM

## 2020-04-07 DIAGNOSIS — Z1211 Encounter for screening for malignant neoplasm of colon: Secondary | ICD-10-CM

## 2020-04-07 DIAGNOSIS — N182 Chronic kidney disease, stage 2 (mild): Secondary | ICD-10-CM

## 2020-04-07 DIAGNOSIS — E039 Hypothyroidism, unspecified: Secondary | ICD-10-CM | POA: Diagnosis not present

## 2020-04-07 DIAGNOSIS — Z1231 Encounter for screening mammogram for malignant neoplasm of breast: Secondary | ICD-10-CM

## 2020-04-07 DIAGNOSIS — E538 Deficiency of other specified B group vitamins: Secondary | ICD-10-CM | POA: Diagnosis not present

## 2020-04-07 DIAGNOSIS — D75839 Thrombocytosis, unspecified: Secondary | ICD-10-CM

## 2020-04-07 LAB — BASIC METABOLIC PANEL
BUN: 16 mg/dL (ref 6–23)
CO2: 29 mEq/L (ref 19–32)
Calcium: 10.2 mg/dL (ref 8.4–10.5)
Chloride: 101 mEq/L (ref 96–112)
Creatinine, Ser: 0.99 mg/dL (ref 0.40–1.20)
GFR: 58.21 mL/min — ABNORMAL LOW (ref >60.00)
Glucose, Bld: 113 mg/dL — ABNORMAL HIGH (ref 70–99)
Potassium: 3.6 mEq/L (ref 3.5–5.1)
Sodium: 138 mEq/L (ref 135–145)

## 2020-04-07 LAB — CBC WITH DIFFERENTIAL/PLATELET
Basophils Absolute: 0.1 10*3/uL (ref 0.0–0.1)
Basophils Relative: 1 % (ref 0.0–3.0)
Eosinophils Absolute: 0.3 10*3/uL (ref 0.0–0.7)
Eosinophils Relative: 3.5 % (ref 0.0–5.0)
HCT: 40.5 % (ref 36.0–46.0)
Hemoglobin: 13.9 g/dL (ref 12.0–15.0)
Lymphocytes Relative: 29.6 % (ref 12.0–46.0)
Lymphs Abs: 2.5 10*3/uL (ref 0.7–4.0)
MCHC: 34.3 g/dL (ref 30.0–36.0)
MCV: 88 fl (ref 78.0–100.0)
Monocytes Absolute: 0.7 10*3/uL (ref 0.1–1.0)
Monocytes Relative: 7.8 % (ref 3.0–12.0)
Neutro Abs: 5 10*3/uL (ref 1.4–7.7)
Neutrophils Relative %: 58.1 % (ref 43.0–77.0)
Platelets: 381 10*3/uL (ref 150.0–400.0)
RBC: 4.6 Mil/uL (ref 3.87–5.11)
RDW: 14 % (ref 11.5–15.5)
WBC: 8.6 10*3/uL (ref 4.0–10.5)

## 2020-04-07 LAB — FOLATE: Folate: 6.9 ng/mL (ref >5.9)

## 2020-04-07 LAB — VITAMIN D 25 HYDROXY (VIT D DEFICIENCY, FRACTURES): VITD: 75.63 ng/mL (ref 30.00–100.00)

## 2020-04-07 LAB — TSH: TSH: 1.46 u[IU]/mL (ref 0.35–4.50)

## 2020-04-07 MED ORDER — BREZTRI AEROSPHERE 160-9-4.8 MCG/ACT IN AERO: 2.0000 | INHALATION_SPRAY | Freq: Two times a day (BID) | RESPIRATORY_TRACT | 5 refills | Status: DC

## 2020-04-07 NOTE — Progress Notes (Signed)
Subjective:  Patient ID: Sarah Watts, female    DOB: 1966/07/28  Age: 54 y.o. MRN: [id number]  CC: Palpitations, Hypothyroidism, Hypertension, Cough, and COPD  This visit occurred during the SARS-CoV-2 public health emergency.  Safety protocols were in place, including screening questions prior to the visit, additional usage of staff PPE, and extensive cleaning of exam room while observing appropriate contact time as indicated for disinfecting solutions.    HPI IJEOMA LOOR presents for f/up - She complains of a 24-month history of palpitations with the feeling that her heart is intermittently rushing and her heart rate is fast.  She says this occurs mostly at rest.  She denies dizziness, lightheadedness, near syncope, edema, fatigue, or chest pain.  She also complains of chronic cough that is rarely productive of clear phlegm.  She denies any recent episodes of hemoptysis.  She also experiences wheezing and shortness of breath with exertion.  She gets some symptom relief with the albuterol inhaler.  She continues to smoke cigarettes.  Outpatient Medications Prior to Visit  Medication Sig Dispense Refill   albuterol (VENTOLIN HFA) 108 (90 Base) MCG/ACT inhaler Inhale 2 puffs into the lungs every 6 (six) hours as needed for wheezing or shortness of breath. 8 g 0   Azilsartan Medoxomil (EDARBI) 80 MG TABS Take 1 tablet (80 mg total) by mouth daily. 90 tablet 1   Cholecalciferol 1.25 MG (50000 UT) capsule Take 1 capsule (50,000 Units total) by mouth once a week. 12 capsule 1   fluticasone (FLONASE) 50 MCG/ACT nasal spray Use 2 spray(s) in each nostril once daily 48 g 1   levocetirizine (XYZAL) 5 MG tablet Take 1 tablet (5 mg total) by mouth every evening. 90 tablet 1   levothyroxine (EUTHYROX) 75 MCG tablet Take 1 tablet (75 mcg total) by mouth daily. 90 tablet 1   potassium chloride SA (KLOR-CON) 20 MEQ tablet Take 1 tablet (20 mEq total) by mouth daily. 90 tablet 1    triamterene-hydrochlorothiazide (DYAZIDE) 37.5-25 MG capsule Take 1 capsule by mouth once daily 90 capsule 0   No facility-administered medications prior to visit.    ROS Review of Systems  Constitutional: Negative for appetite change, diaphoresis, fatigue and unexpected weight change.  HENT: Negative.  Negative for trouble swallowing.   Eyes: Negative.   Respiratory: Positive for cough, shortness of breath and wheezing. Negative for chest tightness.   Cardiovascular: Positive for palpitations. Negative for chest pain and leg swelling.  Gastrointestinal: Negative for abdominal pain, constipation, diarrhea, nausea and vomiting.  Endocrine: Negative.  Negative for cold intolerance, heat intolerance, polydipsia, polyphagia and polyuria.  Genitourinary: Negative.  Negative for difficulty urinating.  Musculoskeletal: Negative.  Negative for arthralgias and myalgias.  Skin: Negative.  Negative for color change and pallor.  Neurological: Negative.  Negative for dizziness, weakness, light-headedness and numbness.  Hematological: Negative for adenopathy. Does not bruise/bleed easily.  Psychiatric/Behavioral: Negative.     Objective:  BP 136/72 (BP Location: Left Arm, Patient Position: Sitting, Cuff Size: Large)    Pulse 65    Temp 98.3 F (36.8 C) (Oral)    Resp 16    Ht 5' (1.524 m)    Wt 174 lb (78.9 kg)    SpO2 96%    BMI 33.98 kg/m   BP Readings from Last 3 Encounters:  04/07/20 136/72  10/08/19 (!) 144/78  02/26/19 (!) 142/78    Wt Readings from Last 3 Encounters:  04/07/20 174 lb (78.9 kg)  10/08/19 176 lb (79.8  kg)  02/26/19 174 lb 8 oz (79.2 kg)    Physical Exam Vitals reviewed.  Constitutional:      General: She is not in acute distress.    Appearance: Normal appearance. She is not ill-appearing, toxic-appearing or diaphoretic.  HENT:     Nose: Nose normal.     Mouth/Throat:     Mouth: Mucous membranes are moist.  Eyes:     General: No scleral icterus.     Conjunctiva/sclera: Conjunctivae normal.  Cardiovascular:     Rate and Rhythm: Normal rate and regular rhythm.     Heart sounds: Normal heart sounds, S1 normal and S2 normal. No murmur heard.  No gallop.      Comments: NSR with sinus arrhythmia, 62 bpm RBBB (old) No LVH or Q waves Pulmonary:     Effort: Pulmonary effort is normal.     Breath sounds: No stridor. No wheezing, rhonchi or rales.  Abdominal:     General: Abdomen is protuberant. Bowel sounds are normal. There is no distension.     Palpations: Abdomen is soft. There is no hepatomegaly, splenomegaly or mass.     Tenderness: There is no abdominal tenderness.     Hernia: No hernia is present.  Musculoskeletal:        General: No swelling. Normal range of motion.     Cervical back: No tenderness.     Right lower leg: No edema.     Left lower leg: No edema.  Lymphadenopathy:     Cervical: No cervical adenopathy.  Skin:    General: Skin is warm and dry.     Findings: No lesion.  Neurological:     General: No focal deficit present.     Mental Status: She is alert and oriented to person, place, and time. Mental status is at baseline.  Psychiatric:        Mood and Affect: Mood normal.        Behavior: Behavior normal.     Lab Results  Component Value Date   WBC 8.6 04/07/2020   HGB 13.9 04/07/2020   HCT 40.5 04/07/2020   PLT 381.0 04/07/2020   GLUCOSE 113 (H) 04/07/2020   CHOL 159 10/08/2019   TRIG 136.0 10/08/2019   HDL 38.60 (L) 10/08/2019   LDLDIRECT 82.0 08/28/2018   LDLCALC 93 10/08/2019   ALT 12 10/08/2019   AST 12 10/08/2019   NA 138 04/07/2020   K 3.6 04/07/2020   CL 101 04/07/2020   CREATININE 0.99 04/07/2020   BUN 16 04/07/2020   CO2 29 04/07/2020   TSH 1.46 04/07/2020   HGBA1C 6.3 04/07/2020    No results found.  Assessment & Plan:   Nayvie was seen today for palpitations, hypothyroidism, hypertension, cough and copd.  Diagnoses and all orders for this visit:  Essential hypertension-  Her blood pressure is adequately well controlled. -     Basic metabolic panel; Future -     Basic metabolic panel  Colon cancer screening -     Cologuard  Acquired hypothyroidism- Her TSH is in the normal range.  She will stay on the current dose of levothyroxine. -     TSH; Future -     TSH  Thrombocytosis ([place])- Her platelet count is normal now. -     CBC with Differential/Platelet; Future -     Folate; Future -     Folate -     CBC with Differential/Platelet  B12 nutritional deficiency- Her folate level is normal.  Will continue parenteral B12 replacement therapy. -     CBC with Differential/Platelet; Future -     Folate; Future -     Folate -     CBC with Differential/Platelet  Prediabetes- Her A1c is at 6.3%.  Medical therapy is not indicated. -     Basic metabolic panel; Future -     Hemoglobin A1c; Future -     Hemoglobin A1c -     Basic metabolic panel  Vitamin D deficiency disease- Her vitamin D level is normal now. -     VITAMIN D 25 Hydroxy (Vit-D Deficiency, Fractures); Future -     VITAMIN D 25 Hydroxy (Vit-D Deficiency, Fractures)  Heart palpitations- Her EKG today shows sinus arrhythmia but I doubt this is causing her symptoms.  I have asked her to undergo an event monitor to see if she is symptomatic from a dysrhythmia. -     Cancel: [place]; Future -     CARDIAC EVENT MONITOR; Future  Visit for screening mammogram -     MM DIGITAL SCREENING BILATERAL; Future  Tobacco abuse- I asked her to quit smoking and to undergo a lung cancer screening test. -     Ambulatory Referral for Lung Cancer Scre  Mixed simple and mucopurulent chronic bronchitis ([place])- She has worsening symptoms with this.  I have asked her to start using a triple inhaler with a LABA/LAMA/ICS.  I gave her samples of Breztri and showed her how to use it.  She demonstrated proficiency with its use. -     Budeson-Glycopyrrol-Formoterol (BREZTRI AEROSPHERE) 160-9-4.8 MCG/ACT AERO;  Inhale 2 puffs into the lungs in the morning and at bedtime.  Palpitation- See above. -     EKG 12-Lead  Chronic renal disease, stage 2, mildly decreased glomerular filtration rate (GFR) between 60-89 mL/min/1.73 square meter- I have asked her to avoid nephrotoxic agents and to quit smoking.  Will continue to maintain control of her blood pressure.   I am having Sarah Watts start on [employer]. I am also having her maintain her levocetirizine, fluticasone, albuterol, levothyroxine, Cholecalciferol, potassium chloride SA, Edarbi, and triamterene-hydrochlorothiazide.  Meds ordered this encounter  Medications   Budeson-Glycopyrrol-Formoterol (BREZTRI AEROSPHERE) 160-9-4.8 MCG/ACT AERO    Sig: Inhale 2 puffs into the lungs in the morning and at bedtime.    Dispense:  10.7 g    Refill:  5   I spent 60 minutes in preparing to see the patient by review of recent labs, imaging and procedures, obtaining and reviewing separately obtained history, communicating with the patient and family or caregiver, ordering medications, tests or procedures, and documenting clinical information in the EHR including the differential Dx, treatment, and any further evaluation and other management of  1. Essential hypertension 2. Acquired hypothyroidism 3. Thrombocytosis ([place]) 4. B12 nutritional deficiency 5. Prediabetes 6. Vitamin D deficiency disease 7. Heart palpitations 8. Tobacco abuse 9. Mixed simple and mucopurulent chronic bronchitis ([place]) 10. Palpitation 11. Chronic renal disease, stage 2, mildly decreased glomerular filtration rate (GFR) between 60-89 mL/min/1.73 square meter     Follow-up: Return in about 2 months (around 06/07/2020).  Scarlette Calico, MD

## 2020-04-07 NOTE — Patient Instructions (Signed)

## 2020-04-08 ENCOUNTER — Encounter: Payer: Self-pay | Admitting: Internal Medicine

## 2020-04-08 LAB — HEMOGLOBIN A1C: Hgb A1c MFr Bld: 6.3 % (ref 4.6–6.5)

## 2020-04-09 DIAGNOSIS — R002 Palpitations: Secondary | ICD-10-CM | POA: Insufficient documentation

## 2020-04-09 DIAGNOSIS — N182 Chronic kidney disease, stage 2 (mild): Secondary | ICD-10-CM | POA: Insufficient documentation

## 2020-04-11 ENCOUNTER — Other Ambulatory Visit: Payer: Self-pay | Admitting: Internal Medicine

## 2020-04-11 DIAGNOSIS — Z720 Tobacco use: Secondary | ICD-10-CM

## 2020-04-11 MED ORDER — VARENICLINE TARTRATE 1 MG PO TABS: 1.0000 mg | ORAL_TABLET | Freq: Two times a day (BID) | ORAL | 3 refills | Status: DC

## 2020-04-11 MED ORDER — CHANTIX STARTING MONTH PAK 0.5 MG X 11 & 1 MG X 42 PO TABS: ORAL_TABLET | ORAL | 0 refills | Status: DC

## 2020-04-19 ENCOUNTER — Other Ambulatory Visit: Payer: Self-pay | Admitting: Internal Medicine

## 2020-04-19 DIAGNOSIS — I10 Essential (primary) hypertension: Secondary | ICD-10-CM

## 2020-04-20 ENCOUNTER — Ambulatory Visit (INDEPENDENT_AMBULATORY_CARE_PROVIDER_SITE_OTHER): Payer: BC Managed Care – PPO

## 2020-04-20 DIAGNOSIS — R002 Palpitations: Secondary | ICD-10-CM | POA: Diagnosis not present

## 2020-04-28 ENCOUNTER — Other Ambulatory Visit: Payer: Self-pay | Admitting: Internal Medicine

## 2020-04-28 DIAGNOSIS — I10 Essential (primary) hypertension: Secondary | ICD-10-CM

## 2020-04-28 DIAGNOSIS — E876 Hypokalemia: Secondary | ICD-10-CM

## 2020-04-28 DIAGNOSIS — E039 Hypothyroidism, unspecified: Secondary | ICD-10-CM

## 2020-05-03 ENCOUNTER — Other Ambulatory Visit: Payer: Self-pay | Admitting: *Deleted

## 2020-05-03 DIAGNOSIS — F17210 Nicotine dependence, cigarettes, uncomplicated: Secondary | ICD-10-CM

## 2020-05-03 DIAGNOSIS — Z87891 Personal history of nicotine dependence: Secondary | ICD-10-CM

## 2020-05-12 ENCOUNTER — Telehealth: Payer: Self-pay | Admitting: Acute Care

## 2020-05-12 NOTE — Telephone Encounter (Signed)
ATC pt, there was no answer and no option to leave a message. Will try back. 

## 2020-05-13 NOTE — Telephone Encounter (Signed)
Attempted to call patient, left vm for patient to return our call.

## 2020-05-16 NOTE — Telephone Encounter (Signed)
Patient is returning phone call. Patient phone number is [phone].

## 2020-05-16 NOTE — Telephone Encounter (Signed)
Spoke with Sarah Watts in [place]. She is fine to not remove her earrings prior to CT. LMTCB.

## 2020-05-16 NOTE — Telephone Encounter (Signed)
Patient is aware nothing further is needed at this time. 

## 2020-05-24 ENCOUNTER — Telehealth: Payer: Self-pay | Admitting: Acute Care

## 2020-05-24 NOTE — Telephone Encounter (Signed)
Spoke with the pt  She states that she is returning a call to someone regarding ? Of deductible for her appt for lung can screening  Forwarding to that pool bc I am unsure about this  Thanks

## 2020-05-25 NOTE — Telephone Encounter (Signed)
LMTC x 1  

## 2020-05-30 NOTE — Telephone Encounter (Signed)
LMTC x 1  

## 2020-06-02 NOTE — Telephone Encounter (Signed)
[place] x 1 - Will close this message per office protocol.

## 2020-06-07 ENCOUNTER — Other Ambulatory Visit: Payer: Self-pay | Admitting: Acute Care

## 2020-06-08 ENCOUNTER — Encounter: Payer: Self-pay | Admitting: Acute Care

## 2020-06-08 ENCOUNTER — Ambulatory Visit
Admission: RE | Admit: 2020-06-08 | Discharge: 2020-06-08 | Disposition: A | Discharge status: Home / Self Care | Payer: BC Managed Care – PPO | Source: Ambulatory Visit | Attending: Acute Care | Admitting: Acute Care

## 2020-06-08 ENCOUNTER — Other Ambulatory Visit: Payer: Self-pay

## 2020-06-08 ENCOUNTER — Ambulatory Visit (INDEPENDENT_AMBULATORY_CARE_PROVIDER_SITE_OTHER): Payer: BC Managed Care – PPO | Admitting: Acute Care

## 2020-06-08 VITALS — BP 130/70 | HR 57 | Temp 97.8°F | Ht 60.0 in | Wt 165.0 lb

## 2020-06-08 DIAGNOSIS — F17210 Nicotine dependence, cigarettes, uncomplicated: Secondary | ICD-10-CM

## 2020-06-08 DIAGNOSIS — Z122 Encounter for screening for malignant neoplasm of respiratory organs: Secondary | ICD-10-CM | POA: Insufficient documentation

## 2020-06-08 DIAGNOSIS — Z87891 Personal history of nicotine dependence: Secondary | ICD-10-CM

## 2020-06-08 NOTE — Progress Notes (Signed)
Shared Decision Making Visit Lung Cancer Screening Program [phone])   Eligibility:  Age 54 y.o.  Pack Years Smoking History Calculation 30 pack year smoking history (# packs/per year x # years smoked)  Recent History of coughing up blood  no  Unexplained weight loss? no ( >Than 15 pounds within the last 6 months )  Prior History Lung / other cancer no (Diagnosis within the last 5 years already requiring surveillance chest CT Scans).  Smoking Status Current Smoker  Former Smokers: Years since quit: NA  Quit Date: NA  Visit Components:  Discussion included one or more decision making aids. yes  Discussion included risk/benefits of screening. yes  Discussion included potential follow up diagnostic testing for abnormal scans. yes  Discussion included meaning and risk of over diagnosis. yes  Discussion included meaning and risk of False Positives. yes  Discussion included meaning of total radiation exposure. yes  Counseling Included:  Importance of adherence to annual lung cancer LDCT screening. yes  Impact of comorbidities on ability to participate in the program. yes  Ability and willingness to under diagnostic treatment. yes  Smoking Cessation Counseling:  Current Smokers:   Discussed importance of smoking cessation. yes  Information about tobacco cessation classes and interventions provided to patient. yes  Patient provided with "ticket" for LDCT Scan. yes  Symptomatic Patient. no  Counseling  Diagnosis Code: Tobacco Use Z72.0  Asymptomatic Patient yes  Counseling (Intermediate counseling: > three minutes counseling) [id number]  Former Smokers:   Discussed the importance of maintaining cigarette abstinence. yes  Diagnosis Code: Personal History of Nicotine Dependence. T41.962  Information about tobacco cessation classes and interventions provided to patient. Yes  Patient provided with "ticket" for LDCT Scan. yes  Written Order for Lung Cancer  Screening with LDCT placed in Epic. Yes (CT Chest Lung Cancer Screening Low Dose W/O CM) [id number] Z12.2-Screening of respiratory organs Z87.891-Personal history of nicotine dependence  BP 130/70 (BP Location: Left Arm, Cuff Size: Normal)   Pulse (!) 57   Temp 97.8 F (36.6 C) (Oral)   Ht 5' (1.524 m)   Wt 165 lb (74.8 kg)   SpO2 99%   BMI 32.22 kg/m    I have spent 25 minutes of face to face time with Sarah Watts discussing the risks and benefits of lung cancer screening. We viewed a power point together that explained in detail the above noted topics. We paused at intervals to allow for questions to be asked and answered to ensure understanding.We discussed that the single most powerful action that she can take to decrease her risk of developing lung cancer is to quit smoking. We discussed whether or not she is ready to commit to setting a quit date. We discussed options for tools to aid in quitting smoking including nicotine replacement therapy, non-nicotine medications, support groups, Quit Smart classes, and behavior modification. We discussed that often times setting smaller, more achievable goals, such as eliminating 1 cigarette a day for a week and then 2 cigarettes a day for a week can be helpful in slowly decreasing the number of cigarettes smoked. This allows for a sense of accomplishment as well as providing a clinical benefit. I gave her the " Be Stronger Than Your Excuses" card with contact information for community resources, classes, free nicotine replacement therapy, and access to mobile apps, text messaging, and on-line smoking cessation help. I have also given her my card and contact information in the event she needs to contact me. We discussed the time and  location of the scan, and that either Sarah Glassman RN or I will call with the results within 24-48 hours of receiving them. I have offered her  a copy of the power point we viewed  as a resource in the event they need  reinforcement of the concepts we discussed today in the office. The patient verbalized understanding of all of  the above and had no further questions upon leaving the office. They have my contact information in the event they have any further questions.  I spent 4 minutes counseling on smoking cessation and the health risks of continued tobacco abuse.  I explained to the patient that there has been a high incidence of coronary artery disease noted on these exams. I explained that this is a non-gated exam therefore degree or severity cannot be determined. This patient is  Not on statin therapy. I have asked the patient to follow-up with their PCP regarding any incidental finding of coronary artery disease and management with diet or medication as their PCP  feels is clinically indicated. The patient verbalized understanding of the above and had no further questions upon completion of the visit.   Pt. Has a prescription for Chantix.She did not fill it due to cost. She has been offered an appointment with pharmacy counseling. She wants to get through the scan today before deciding.   Magdalen Spatz, NP 06/08/2020

## 2020-06-08 NOTE — Patient Instructions (Signed)
Thank you for participating in the [facility]. It was our pleasure to meet you today. We will call you with the results of your scan within the next few days. Your scan will be assigned a Lung RADS category score by the physicians reading the scans.  This Lung RADS score determines follow up scanning.  See below for description of categories, and follow up screening recommendations. We will be in touch to schedule your follow up screening annually or based on recommendations of our providers. We will fax a copy of your scan results to your Primary Care Physician, or the physician who referred you to the program, to ensure they have the results. Please call the office if you have any questions or concerns regarding your scanning experience or results.  Our office number is [phone]. Please speak with Denise Phelps, RN. She is our Lung Cancer Screening RN. If she is unavailable when you call, please have the office staff send her a message. She will return your call at her earliest convenience. Remember, if your scan is normal, we will scan you annually as long as you continue to meet the criteria for the program. (Age 55-77, Current smoker or smoker who has quit within the last 15 years). If you are a smoker, remember, quitting is the single most powerful action that you can take to decrease your risk of lung cancer and other pulmonary, breathing related problems. We know quitting is hard, and we are here to help.  Please let us know if there is anything we can do to help you meet your goal of quitting. If you are a former smoker, congratulations. We are proud of you! Remain smoke free! Remember you can refer friends or family members through the number above.  We will screen them to make sure they meet criteria for the program. Thank you for helping us take better care of you by participating in Lung Screening.  Lung RADS Categories:  Lung RADS 1: no nodules  or definitely non-concerning nodules.  Recommendation is for a repeat annual scan in 12 months.  Lung RADS 2:  nodules that are non-concerning in appearance and behavior with a very low likelihood of becoming an active cancer. Recommendation is for a repeat annual scan in 12 months.  Lung RADS 3: nodules that are probably non-concerning , includes nodules with a low likelihood of becoming an active cancer.  Recommendation is for a 6-month repeat screening scan. Often noted after an upper respiratory illness. We will be in touch to make sure you have no questions, and to schedule your 6-month scan.  Lung RADS 4 A: nodules with concerning findings, recommendation is most often for a follow up scan in 3 months or additional testing based on our provider's assessment of the scan. We will be in touch to make sure you have no questions and to schedule the recommended 3 month follow up scan.  Lung RADS 4 B:  indicates findings that are concerning. We will be in touch with you to schedule additional diagnostic testing based on our provider's  assessment of the scan.   

## 2020-06-09 ENCOUNTER — Other Ambulatory Visit: Payer: Self-pay | Admitting: *Deleted

## 2020-06-09 DIAGNOSIS — Z87891 Personal history of nicotine dependence: Secondary | ICD-10-CM

## 2020-06-09 DIAGNOSIS — F17210 Nicotine dependence, cigarettes, uncomplicated: Secondary | ICD-10-CM

## 2020-06-09 NOTE — Progress Notes (Signed)
Please call patient and let them  know their  low dose Ct was read as a Lung RADS 1, negative study: no nodules or definitely benign nodules. Radiology recommendation is for a repeat LDCT in 12 months. .Please let them  know we will order and schedule their  annual screening scan for 05/2021. Marland Kitchen Please place order for annual  screening scan for  05/2021 and fax results to PCP. Thanks so much.

## 2020-06-14 ENCOUNTER — Other Ambulatory Visit: Payer: Self-pay

## 2020-06-14 ENCOUNTER — Encounter: Payer: Self-pay | Admitting: Internal Medicine

## 2020-06-14 ENCOUNTER — Ambulatory Visit (INDEPENDENT_AMBULATORY_CARE_PROVIDER_SITE_OTHER): Payer: BC Managed Care – PPO | Admitting: Internal Medicine

## 2020-06-14 VITALS — BP 136/68 | HR 65 | Temp 98.6°F | Resp 16 | Ht 60.0 in | Wt 166.1 lb

## 2020-06-14 DIAGNOSIS — J432 Centrilobular emphysema: Secondary | ICD-10-CM | POA: Insufficient documentation

## 2020-06-14 DIAGNOSIS — Z1231 Encounter for screening mammogram for malignant neoplasm of breast: Secondary | ICD-10-CM | POA: Diagnosis not present

## 2020-06-14 DIAGNOSIS — H6983 Other specified disorders of Eustachian tube, bilateral: Secondary | ICD-10-CM

## 2020-06-14 DIAGNOSIS — J301 Allergic rhinitis due to pollen: Secondary | ICD-10-CM

## 2020-06-14 DIAGNOSIS — I10 Essential (primary) hypertension: Secondary | ICD-10-CM | POA: Diagnosis not present

## 2020-06-14 DIAGNOSIS — J418 Mixed simple and mucopurulent chronic bronchitis: Secondary | ICD-10-CM

## 2020-06-14 DIAGNOSIS — N182 Chronic kidney disease, stage 2 (mild): Secondary | ICD-10-CM

## 2020-06-14 DIAGNOSIS — Z1211 Encounter for screening for malignant neoplasm of colon: Secondary | ICD-10-CM

## 2020-06-14 MED ORDER — FLUTICASONE PROPIONATE 50 MCG/ACT NA SUSP: NASAL | 1 refills | Status: DC

## 2020-06-14 MED ORDER — LEVOCETIRIZINE DIHYDROCHLORIDE 5 MG PO TABS: 5.0000 mg | ORAL_TABLET | Freq: Every evening | ORAL | 1 refills | Status: DC

## 2020-06-14 MED ORDER — BREZTRI AEROSPHERE 160-9-4.8 MCG/ACT IN AERO: 2.0000 | INHALATION_SPRAY | Freq: Two times a day (BID) | RESPIRATORY_TRACT | 5 refills | Status: DC

## 2020-06-14 NOTE — Patient Instructions (Signed)

## 2020-06-14 NOTE — Progress Notes (Signed)
Subjective:  Patient ID: Sarah Watts, female    DOB: Jun 15, 1966  Age: 54 y.o. MRN: [id number]  CC: Hypertension and COPD  This visit occurred during the SARS-CoV-2 public health emergency.  Safety protocols were in place, including screening questions prior to the visit, additional usage of staff PPE, and extensive cleaning of exam room while observing appropriate contact time as indicated for disinfecting solutions.    HPI Sarah Watts presents for f/up - She quit smoking 6 days ago.  She continues to have episodes of wheezing and shortness of breath.  She has not coughed up any phlegm a lot lately.  She tells me her blood pressure has been well controlled.  She denies headache, blurred vision, chest pain, diaphoresis, or edema.  Outpatient Medications Prior to Visit  Medication Sig Dispense Refill  . albuterol (VENTOLIN HFA) 108 (90 Base) MCG/ACT inhaler Inhale 2 puffs into the lungs every 6 (six) hours as needed for wheezing or shortness of breath. 8 g 0  . Cholecalciferol 1.25 MG (50000 UT) capsule Take 1 capsule (50,000 Units total) by mouth once a week. 12 capsule 1  . EDARBI 80 MG TABS TAKE 1 TABLET ONCE DAILY. 90 tablet 1  . EUTHYROX 75 MCG tablet Take 1 tablet by mouth once daily 90 tablet 0  . triamterene-hydrochlorothiazide (DYAZIDE) 37.5-25 MG capsule Take 1 capsule by mouth once daily 90 capsule 0  . Budeson-Glycopyrrol-Formoterol (BREZTRI AEROSPHERE) 160-9-4.8 MCG/ACT AERO Inhale 2 puffs into the lungs in the morning and at bedtime. 10.7 g 5  . fluticasone (FLONASE) 50 MCG/ACT nasal spray Use 2 spray(s) in each nostril once daily 48 g 1  . levocetirizine (XYZAL) 5 MG tablet Take 1 tablet (5 mg total) by mouth every evening. 90 tablet 1  . varenicline (CHANTIX CONTINUING MONTH PAK) 1 MG tablet Take 1 tablet (1 mg total) by mouth 2 (two) times daily. 60 tablet 3  . varenicline (CHANTIX STARTING MONTH PAK) 0.5 MG X 11 & 1 MG X 42 tablet Take one 0.5 mg tablet by mouth once  daily for 3 days, then increase to one 0.5 mg tablet twice daily for 4 days, then increase to one 1 mg tablet twice daily. 53 tablet 0  . potassium chloride SA (KLOR-CON) 20 MEQ tablet Take 1 tablet (20 mEq total) by mouth daily. (Patient not taking: Reported on 06/14/2020) 90 tablet 1   No facility-administered medications prior to visit.    ROS Review of Systems  Constitutional: Negative for appetite change, chills, diaphoresis, fatigue and fever.  HENT: Positive for congestion, postnasal drip and rhinorrhea. Negative for sinus pressure, sore throat and trouble swallowing.   Eyes: Negative for visual disturbance.  Respiratory: Positive for shortness of breath and wheezing. Negative for cough and stridor.   Cardiovascular: Negative for chest pain, palpitations and leg swelling.  Gastrointestinal: Negative for abdominal pain, constipation, diarrhea, nausea and vomiting.  Endocrine: Negative.   Genitourinary: Negative.  Negative for difficulty urinating and dysuria.  Musculoskeletal: Negative for arthralgias and joint swelling.  Skin: Negative for color change and rash.  Neurological: Negative.  Negative for dizziness, weakness, light-headedness and headaches.  Hematological: Negative for adenopathy. Does not bruise/bleed easily.  Psychiatric/Behavioral: Negative.     Objective:  BP 136/68 (BP Location: Left Arm, Patient Position: Sitting, Cuff Size: Normal)   Pulse 65   Temp 98.6 F (37 C) (Oral)   Resp 16   Ht 5' (1.524 m)   Wt 166 lb 2 oz (75.4 kg)  SpO2 96%   BMI 32.44 kg/m   BP Readings from Last 3 Encounters:  06/14/20 136/68  06/08/20 130/70  04/07/20 136/72    Wt Readings from Last 3 Encounters:  06/14/20 166 lb 2 oz (75.4 kg)  06/08/20 165 lb (74.8 kg)  04/07/20 174 lb (78.9 kg)    Physical Exam Vitals reviewed.  HENT:     Nose: Nose normal.     Mouth/Throat:     Mouth: Mucous membranes are moist.  Eyes:     General: No scleral icterus.     Conjunctiva/sclera: Conjunctivae normal.  Cardiovascular:     Rate and Rhythm: Normal rate and regular rhythm.     Heart sounds: No murmur heard.   Pulmonary:     Effort: Pulmonary effort is normal.     Breath sounds: No stridor. No wheezing, rhonchi or rales.  Abdominal:     General: Abdomen is protuberant.     Palpations: There is no mass.     Tenderness: There is no abdominal tenderness.  Musculoskeletal:        General: Normal range of motion.     Cervical back: Neck supple.     Right lower leg: No edema.     Left lower leg: No edema.  Lymphadenopathy:     Cervical: No cervical adenopathy.  Skin:    General: Skin is warm and dry.  Neurological:     General: No focal deficit present.     Mental Status: She is alert.  Psychiatric:        Behavior: Behavior normal.     Lab Results  Component Value Date   WBC 8.6 04/07/2020   HGB 13.9 04/07/2020   HCT 40.5 04/07/2020   PLT 381.0 04/07/2020   GLUCOSE 122 (H) 06/14/2020   CHOL 159 10/08/2019   TRIG 136.0 10/08/2019   HDL 38.60 (L) 10/08/2019   LDLDIRECT 82.0 08/28/2018   LDLCALC 93 10/08/2019   ALT 12 10/08/2019   AST 12 10/08/2019   NA 140 06/14/2020   K 4.0 06/14/2020   CL 104 06/14/2020   CREATININE 0.91 06/14/2020   BUN 18 06/14/2020   CO2 31 06/14/2020   TSH 1.46 04/07/2020   HGBA1C 6.3 04/07/2020    CT CHEST LUNG CA SCREEN LOW DOSE W/O CM  Result Date: 06/08/2020 CLINICAL DATA:  Current smoker, 30 pack-year history. EXAM: CT CHEST WITHOUT CONTRAST LOW-DOSE FOR LUNG CANCER SCREENING TECHNIQUE: Multidetector CT imaging of the chest was performed following the standard protocol without IV contrast. COMPARISON:  None. FINDINGS: Cardiovascular: Heart is at the upper limits of normal in size to mildly enlarged. No pericardial effusion. Mediastinum/Nodes: No pathologically enlarged mediastinal or axillary lymph nodes. Hilar regions are difficult to definitively evaluate without IV contrast appear grossly  unremarkable. Esophagus is grossly unremarkable. Lungs/Pleura: Smoking related respiratory bronchiolitis. Mild centrilobular and paraseptal emphysema. No worrisome pulmonary nodules. Scarring in the right middle lobe and lingula. No pleural fluid. Airway is unremarkable. Upper Abdomen: Visualized portions of the liver, adrenal glands, kidneys, spleen, pancreas, stomach and bowel are grossly unremarkable. Musculoskeletal: No worrisome lytic or sclerotic lesions. IMPRESSION: 1. Lung-RADS 1, negative. Continue annual screening with low-dose chest CT without contrast in 12 months. 2.  Emphysema (ICD10-J43.9). Electronically Signed   By: Lorin Picket M.D.   On: 06/08/2020 11:14    Assessment & Plan:   Sarah Watts was seen today for hypertension and copd.  Diagnoses and all orders for this visit:  Encounter for screening mammogram for malignant neoplasm  of breast -     MM Digital Screening; Future  Colon cancer screening -     Cologuard  Essential hypertension- Her BP is well controlled. -     BASIC METABOLIC PANEL WITH GFR; Future -     BASIC METABOLIC PANEL WITH GFR  Chronic renal disease, stage 2, mildly decreased glomerular filtration rate (GFR) between 60-89 mL/min/1.73 square meter- Her renal function has imporved -     BASIC METABOLIC PANEL WITH GFR; Future -     BASIC METABOLIC PANEL WITH GFR  Centrilobular emphysema (HCC)  Non-seasonal allergic rhinitis due to pollen -     Discontinue: fluticasone (FLONASE) 50 MCG/ACT nasal spray; Use 2 spray(s) in each nostril once daily -     fluticasone (FLONASE) 50 MCG/ACT nasal spray; Use 2 spray(s) in each nostril once daily -     levocetirizine (XYZAL) 5 MG tablet; Take 1 tablet (5 mg total) by mouth every evening.  Mixed simple and mucopurulent chronic bronchitis ([place])- She will continue using a LAMA/LABA/ICS combo inhaler. -     Budeson-Glycopyrrol-Formoterol (BREZTRI AEROSPHERE) 160-9-4.8 MCG/ACT AERO; Inhale 2 puffs into the lungs in the  morning and at bedtime.  Seasonal allergic rhinitis due to pollen -     levocetirizine (XYZAL) 5 MG tablet; Take 1 tablet (5 mg total) by mouth every evening.  Dysfunction of both eustachian tubes   I have discontinued Jeralene Huff. Ferg's potassium chloride SA, varenicline, and Chantix Starting Month Pak. I am also having her maintain her albuterol, Cholecalciferol, Edarbi, triamterene-hydrochlorothiazide, Euthyrox, fluticasone, Breztri Aerosphere, and levocetirizine.  Meds ordered this encounter  Medications  . DISCONTD: fluticasone (FLONASE) 50 MCG/ACT nasal spray    Sig: Use 2 spray(s) in each nostril once daily    Dispense:  48 g    Refill:  1  . fluticasone (FLONASE) 50 MCG/ACT nasal spray    Sig: Use 2 spray(s) in each nostril once daily    Dispense:  48 g    Refill:  1  . Budeson-Glycopyrrol-Formoterol (BREZTRI AEROSPHERE) 160-9-4.8 MCG/ACT AERO    Sig: Inhale 2 puffs into the lungs in the morning and at bedtime.    Dispense:  10.7 g    Refill:  5  . levocetirizine (XYZAL) 5 MG tablet    Sig: Take 1 tablet (5 mg total) by mouth every evening.    Dispense:  90 tablet    Refill:  1     Follow-up: Return in about 6 months (around 12/15/2020).  Sarah Calico, MD

## 2020-06-15 LAB — BASIC METABOLIC PANEL WITH GFR
BUN: 18 mg/dL (ref 7–25)
CO2: 31 mmol/L (ref 20–32)
Calcium: 10.3 mg/dL (ref 8.6–10.4)
Chloride: 104 mmol/L (ref 98–110)
Creat: 0.91 mg/dL (ref 0.50–1.05)
GFR, Est African American: 82 mL/min/{1.73_m2} (ref >=60)
GFR, Est Non African American: 71 mL/min/{1.73_m2} (ref >=60)
Glucose, Bld: 122 mg/dL — ABNORMAL HIGH (ref 65–99)
Potassium: 4 mmol/L (ref 3.5–5.3)
Sodium: 140 mmol/L (ref 135–146)

## 2020-08-02 ENCOUNTER — Other Ambulatory Visit: Payer: Self-pay | Admitting: Internal Medicine

## 2020-08-02 ENCOUNTER — Encounter: Payer: Self-pay | Admitting: Internal Medicine

## 2020-08-02 DIAGNOSIS — I10 Essential (primary) hypertension: Secondary | ICD-10-CM

## 2020-08-02 DIAGNOSIS — E876 Hypokalemia: Secondary | ICD-10-CM

## 2020-08-02 DIAGNOSIS — E039 Hypothyroidism, unspecified: Secondary | ICD-10-CM

## 2020-10-26 ENCOUNTER — Telehealth: Payer: BC Managed Care – PPO | Admitting: Family

## 2020-10-26 DIAGNOSIS — Z20822 Contact with and (suspected) exposure to covid-19: Secondary | ICD-10-CM

## 2020-10-26 MED ORDER — ALBUTEROL SULFATE HFA 108 (90 BASE) MCG/ACT IN AERS: 2.0000 | INHALATION_SPRAY | Freq: Four times a day (QID) | RESPIRATORY_TRACT | 0 refills | Status: DC | PRN

## 2020-10-26 MED ORDER — BENZONATATE 100 MG PO CAPS: 100.0000 mg | ORAL_CAPSULE | Freq: Three times a day (TID) | ORAL | 0 refills | Status: DC | PRN

## 2020-10-26 NOTE — Progress Notes (Signed)
E-Visit for Corona Virus Screening  We are sorry you are not feeling well. We are here to help!  You have tested positive for COVID-19, meaning that you were infected with the novel coronavirus and could give the virus to others.  It is vitally important that you stay home so you do not spread it to others.      Please continue isolation at home, for at least 10 days since the start of your symptoms and until you have had 24 hours with no fever (without taking a fever reducer) and with improving of symptoms.  Most cases improve 10 days from onset but we have seen a small number of patients who have gotten worse after the 10 days.  Please be sure to watch for worsening symptoms and remain taking the proper precautions.   Go to the nearest hospital ED for assessment if fever/cough/breathlessness are severe or illness seems like a threat to life.    The following symptoms may appear 2-14 days after exposure: . Fever . Cough . Shortness of breath or difficulty breathing . Chills . Repeated shaking with chills . Muscle pain . Headache . Sore throat . New loss of taste or smell . Fatigue . Congestion or runny nose . Nausea or vomiting . Diarrhea  You have been enrolled in [facility] Monitoring for COVID-19. Daily you will receive a questionnaire within the MyChart website. Our COVID-19 response team will be monitoring your responses daily.  You can use medication such as A prescription cough medication called Tessalon Perles 100 mg. You may take 1-2 capsules every 8 hours as needed for cough and A prescription inhaler called Albuterol MDI 90 mcg /actuation 2 puffs every 4 hours as needed for shortness of breath, wheezing, cough  We have asked the monoclonal antibody team contact you to discuss the infusion with you and potentially set this up. You should hear from them in the next 48 hours.    You may also take acetaminophen (Tylenol) as needed for fever.  HOME CARE: . Only take  medications as instructed by your medical team. . Drink plenty of fluids and get plenty of rest. . A steam or ultrasonic humidifier can help if you have congestion.   GET HELP RIGHT AWAY IF YOU HAVE EMERGENCY WARNING SIGNS.  Call 911 or proceed to your closest emergency facility if: . You develop worsening high fever. . Trouble breathing . Bluish lips or face . Persistent pain or pressure in the chest . New confusion . Inability to wake or stay awake . You cough up blood. . Your symptoms become more severe . Inability to hold down food or fluids  This list is not all possible symptoms. Contact your medical provider for any symptoms that are severe or concerning to you.    Your e-visit answers were reviewed by a board certified advanced clinical practitioner to complete your personal care plan.  Depending on the condition, your plan could have included both over the counter or prescription medications.  If there is a problem please reply once you have received a response from your provider.  Your safety is important to [place].  If you have drug allergies check your prescription carefully.    You can use MyChart to ask questions about today's visit, request a non-urgent call back, or ask for a work or school excuse for 24 hours related to this e-Visit. If it has been greater than 24 hours you will need to follow up with your provider,  or enter a new e-Visit to address those concerns. You will get an e-mail in the next two days asking about your experience.  I hope that your e-visit has been valuable and will speed your recovery. Thank you for using e-visits.    Approximately 5 minutes was spent documenting and reviewing patient's chart.

## 2020-11-02 ENCOUNTER — Other Ambulatory Visit: Payer: Self-pay | Admitting: Internal Medicine

## 2020-11-02 DIAGNOSIS — E876 Hypokalemia: Secondary | ICD-10-CM

## 2020-11-02 DIAGNOSIS — I10 Essential (primary) hypertension: Secondary | ICD-10-CM

## 2020-11-23 ENCOUNTER — Other Ambulatory Visit: Payer: Self-pay | Admitting: Internal Medicine

## 2020-11-23 DIAGNOSIS — I10 Essential (primary) hypertension: Secondary | ICD-10-CM

## 2020-12-20 ENCOUNTER — Encounter: Payer: Self-pay | Admitting: Internal Medicine

## 2020-12-20 ENCOUNTER — Other Ambulatory Visit: Payer: Self-pay | Admitting: Internal Medicine

## 2020-12-20 DIAGNOSIS — J432 Centrilobular emphysema: Secondary | ICD-10-CM

## 2020-12-20 DIAGNOSIS — J418 Mixed simple and mucopurulent chronic bronchitis: Secondary | ICD-10-CM

## 2020-12-20 DIAGNOSIS — J410 Simple chronic bronchitis: Secondary | ICD-10-CM | POA: Insufficient documentation

## 2020-12-20 MED ORDER — BREZTRI AEROSPHERE 160-9-4.8 MCG/ACT IN AERO: 2.0000 | INHALATION_SPRAY | Freq: Two times a day (BID) | RESPIRATORY_TRACT | 5 refills | Status: DC

## 2021-02-01 ENCOUNTER — Other Ambulatory Visit: Payer: Self-pay | Admitting: Internal Medicine

## 2021-02-01 DIAGNOSIS — E039 Hypothyroidism, unspecified: Secondary | ICD-10-CM

## 2021-02-01 DIAGNOSIS — I10 Essential (primary) hypertension: Secondary | ICD-10-CM

## 2021-02-01 DIAGNOSIS — E876 Hypokalemia: Secondary | ICD-10-CM

## 2021-02-02 ENCOUNTER — Telehealth: Payer: Self-pay

## 2021-02-02 NOTE — Telephone Encounter (Signed)
Key: [id number]

## 2021-02-02 NOTE — Telephone Encounter (Signed)
approved through 02/02/2022

## 2021-02-03 ENCOUNTER — Other Ambulatory Visit: Payer: Self-pay | Admitting: Internal Medicine

## 2021-02-03 DIAGNOSIS — I10 Essential (primary) hypertension: Secondary | ICD-10-CM

## 2021-02-03 DIAGNOSIS — E876 Hypokalemia: Secondary | ICD-10-CM

## 2021-02-03 DIAGNOSIS — E039 Hypothyroidism, unspecified: Secondary | ICD-10-CM

## 2021-02-09 ENCOUNTER — Other Ambulatory Visit: Payer: Self-pay | Admitting: Internal Medicine

## 2021-02-09 DIAGNOSIS — E039 Hypothyroidism, unspecified: Secondary | ICD-10-CM

## 2021-02-09 DIAGNOSIS — E876 Hypokalemia: Secondary | ICD-10-CM

## 2021-02-09 DIAGNOSIS — I10 Essential (primary) hypertension: Secondary | ICD-10-CM

## 2021-02-09 MED ORDER — LEVOTHYROXINE SODIUM 75 MCG PO TABS: 75.0000 ug | ORAL_TABLET | Freq: Every day | ORAL | 0 refills | Status: DC

## 2021-02-09 MED ORDER — TRIAMTERENE-HCTZ 37.5-25 MG PO CAPS: 1.0000 | ORAL_CAPSULE | Freq: Every day | ORAL | 0 refills | Status: DC

## 2021-02-09 NOTE — Telephone Encounter (Signed)
Pt called stating she needs a refill on her thyroid medication and her trial water pill. [facility] is [facility]. Thanks.

## 2021-03-08 ENCOUNTER — Other Ambulatory Visit: Payer: Self-pay

## 2021-03-09 ENCOUNTER — Ambulatory Visit (INDEPENDENT_AMBULATORY_CARE_PROVIDER_SITE_OTHER): Payer: BC Managed Care – PPO | Admitting: Internal Medicine

## 2021-03-09 ENCOUNTER — Encounter: Payer: Self-pay | Admitting: Internal Medicine

## 2021-03-09 ENCOUNTER — Other Ambulatory Visit: Payer: Self-pay

## 2021-03-09 VITALS — BP 154/78 | HR 69 | Temp 98.3°F | Resp 16 | Ht 60.0 in | Wt 178.0 lb

## 2021-03-09 DIAGNOSIS — Z0000 Encounter for general adult medical examination without abnormal findings: Secondary | ICD-10-CM | POA: Diagnosis not present

## 2021-03-09 DIAGNOSIS — R7303 Prediabetes: Secondary | ICD-10-CM

## 2021-03-09 DIAGNOSIS — Z1211 Encounter for screening for malignant neoplasm of colon: Secondary | ICD-10-CM

## 2021-03-09 DIAGNOSIS — N182 Chronic kidney disease, stage 2 (mild): Secondary | ICD-10-CM

## 2021-03-09 DIAGNOSIS — E538 Deficiency of other specified B group vitamins: Secondary | ICD-10-CM

## 2021-03-09 DIAGNOSIS — Z23 Encounter for immunization: Secondary | ICD-10-CM

## 2021-03-09 DIAGNOSIS — J410 Simple chronic bronchitis: Secondary | ICD-10-CM

## 2021-03-09 DIAGNOSIS — I10 Essential (primary) hypertension: Secondary | ICD-10-CM

## 2021-03-09 DIAGNOSIS — E559 Vitamin D deficiency, unspecified: Secondary | ICD-10-CM | POA: Diagnosis not present

## 2021-03-09 DIAGNOSIS — J432 Centrilobular emphysema: Secondary | ICD-10-CM

## 2021-03-09 DIAGNOSIS — E039 Hypothyroidism, unspecified: Secondary | ICD-10-CM

## 2021-03-09 DIAGNOSIS — Z1231 Encounter for screening mammogram for malignant neoplasm of breast: Secondary | ICD-10-CM

## 2021-03-09 DIAGNOSIS — D528 Other folate deficiency anemias: Secondary | ICD-10-CM

## 2021-03-09 LAB — CBC WITH DIFFERENTIAL/PLATELET
Basophils Absolute: 0 10*3/uL (ref 0.0–0.1)
Basophils Relative: 0.6 % (ref 0.0–3.0)
Eosinophils Absolute: 0.3 10*3/uL (ref 0.0–0.7)
Eosinophils Relative: 3.9 % (ref 0.0–5.0)
HCT: 37.7 % (ref 36.0–46.0)
Hemoglobin: 12.6 g/dL (ref 12.0–15.0)
Lymphocytes Relative: 33.2 % (ref 12.0–46.0)
Lymphs Abs: 2.7 10*3/uL (ref 0.7–4.0)
MCHC: 33.5 g/dL (ref 30.0–36.0)
MCV: 85.2 fl (ref 78.0–100.0)
Monocytes Absolute: 0.6 10*3/uL (ref 0.1–1.0)
Monocytes Relative: 8 % (ref 3.0–12.0)
Neutro Abs: 4.3 10*3/uL (ref 1.4–7.7)
Neutrophils Relative %: 54.3 % (ref 43.0–77.0)
Platelets: 349 10*3/uL (ref 150.0–400.0)
RBC: 4.42 Mil/uL (ref 3.87–5.11)
RDW: 13 % (ref 11.5–15.5)
WBC: 8 10*3/uL (ref 4.0–10.5)

## 2021-03-09 LAB — FOLATE: Folate: 5.5 ng/mL — ABNORMAL LOW (ref >5.9)

## 2021-03-09 LAB — HEMOGLOBIN A1C: Hgb A1c MFr Bld: 6.4 % (ref 4.6–6.5)

## 2021-03-09 LAB — VITAMIN B12: Vitamin B-12: 139 pg/mL — ABNORMAL LOW (ref 211–911)

## 2021-03-09 LAB — LIPID PANEL
Cholesterol: 165 mg/dL (ref 0–200)
HDL: 37.5 mg/dL — ABNORMAL LOW (ref >39.00)
LDL Cholesterol: 93 mg/dL (ref 0–99)
NonHDL: 127.16
Total CHOL/HDL Ratio: 4
Triglycerides: 173 mg/dL — ABNORMAL HIGH (ref 0.0–149.0)
VLDL: 34.6 mg/dL (ref 0.0–40.0)

## 2021-03-09 LAB — BASIC METABOLIC PANEL
BUN: 19 mg/dL (ref 6–23)
CO2: 31 mEq/L (ref 19–32)
Calcium: 9.9 mg/dL (ref 8.4–10.5)
Chloride: 104 mEq/L (ref 96–112)
Creatinine, Ser: 0.94 mg/dL (ref 0.40–1.20)
GFR: 68.06 mL/min (ref >60.00)
Glucose, Bld: 112 mg/dL — ABNORMAL HIGH (ref 70–99)
Potassium: 4 mEq/L (ref 3.5–5.1)
Sodium: 140 mEq/L (ref 135–145)

## 2021-03-09 LAB — HEPATIC FUNCTION PANEL
ALT: 14 U/L (ref 0–35)
AST: 14 U/L (ref 0–37)
Albumin: 4.1 g/dL (ref 3.5–5.2)
Alkaline Phosphatase: 74 U/L (ref 39–117)
Bilirubin, Direct: 0 mg/dL (ref 0.0–0.3)
Total Bilirubin: 0.3 mg/dL (ref 0.2–1.2)
Total Protein: 6.8 g/dL (ref 6.0–8.3)

## 2021-03-09 LAB — VITAMIN D 25 HYDROXY (VIT D DEFICIENCY, FRACTURES): VITD: 30.92 ng/mL (ref 30.00–100.00)

## 2021-03-09 LAB — TSH: TSH: 0.37 u[IU]/mL (ref 0.35–4.50)

## 2021-03-09 MED ORDER — FOLIC ACID 1 MG PO TABS: 1.0000 mg | ORAL_TABLET | Freq: Every day | ORAL | 1 refills | Status: DC

## 2021-03-09 MED ORDER — TRELEGY ELLIPTA 100-62.5-25 MCG/INH IN AEPB: 1.0000 | INHALATION_SPRAY | Freq: Every day | RESPIRATORY_TRACT | 1 refills | Status: DC

## 2021-03-09 NOTE — Progress Notes (Addendum)
Subjective:  Patient ID: Sarah Watts, female    DOB: 10-Mar-1966  Age: 55 y.o. MRN: [id number]  CC: Annual Exam, Hypertension, Hypothyroidism, and Cough  This visit occurred during the SARS-CoV-2 public health emergency.  Safety protocols were in place, including screening questions prior to the visit, additional usage of staff PPE, and extensive cleaning of exam room while observing appropriate contact time as indicated for disinfecting solutions.    HPI KATHLEEN TAMM presents for a CPX and f/up -   She continues to complain of cough that is rarely productive of yellow phlegm.  She also has wheezing and shortness of breath.  She has quit smoking.  She is not using an inhaler because her insurance company would not pay for it.  She complains of weight gain.  She is not currently receiving a B12 supplement.  Outpatient Medications Prior to Visit  Medication Sig Dispense Refill  . albuterol (VENTOLIN HFA) 108 (90 Base) MCG/ACT inhaler Inhale 2 puffs into the lungs every 6 (six) hours as needed for wheezing or shortness of breath. 8 g 0  . EDARBI 80 MG TABS TAKE 1 TABLET ONCE DAILY. 30 tablet 5  . fluticasone (FLONASE) 50 MCG/ACT nasal spray Use 2 spray(s) in each nostril once daily 48 g 1  . levocetirizine (XYZAL) 5 MG tablet Take 1 tablet (5 mg total) by mouth every evening. 90 tablet 1  . levothyroxine (EUTHYROX) 75 MCG tablet Take 1 tablet (75 mcg total) by mouth daily. 90 tablet 0  . triamterene-hydrochlorothiazide (DYAZIDE) 37.5-25 MG capsule Take 1 each (1 capsule total) by mouth daily. 90 capsule 0  . Budeson-Glycopyrrol-Formoterol (BREZTRI AEROSPHERE) 160-9-4.8 MCG/ACT AERO Inhale 2 puffs into the lungs in the morning and at bedtime. 10.7 g 5  . Cholecalciferol 1.25 MG (50000 UT) capsule Take 1 capsule (50,000 Units total) by mouth once a week. 12 capsule 1   No facility-administered medications prior to visit.    ROS Review of Systems  Constitutional: Positive for  unexpected weight change (wt gain). Negative for appetite change, chills, diaphoresis, fatigue and fever.  HENT: Negative.  Negative for sore throat and trouble swallowing.   Eyes: Negative.   Respiratory: Positive for cough, shortness of breath and wheezing. Negative for chest tightness.   Cardiovascular: Negative for chest pain, palpitations and leg swelling.  Gastrointestinal: Negative.  Negative for abdominal pain, constipation, diarrhea and nausea.  Endocrine: Negative.   Genitourinary: Negative.  Negative for difficulty urinating, dysuria and hematuria.  Musculoskeletal: Negative.   Skin: Negative.   Neurological: Negative.  Negative for dizziness, weakness, light-headedness and headaches.  Hematological: Negative for adenopathy. Does not bruise/bleed easily.  Psychiatric/Behavioral: Negative.     Objective:  BP (!) 154/78 (BP Location: Left Arm, Patient Position: Sitting, Cuff Size: Large)   Pulse 69   Temp 98.3 F (36.8 C) (Oral)   Resp 16   Ht 5' (1.524 m)   Wt 178 lb (80.7 kg)   SpO2 97%   BMI 34.76 kg/m   BP Readings from Last 3 Encounters:  03/09/21 (!) 154/78  06/14/20 136/68  06/08/20 130/70    Wt Readings from Last 3 Encounters:  03/09/21 178 lb (80.7 kg)  06/14/20 166 lb 2 oz (75.4 kg)  06/08/20 165 lb (74.8 kg)    Physical Exam Vitals reviewed.  Constitutional:      Appearance: Normal appearance. She is not ill-appearing.  HENT:     Nose: Nose normal.     Mouth/Throat:  Mouth: Mucous membranes are moist.  Eyes:     General: No scleral icterus.    Conjunctiva/sclera: Conjunctivae normal.  Cardiovascular:     Rate and Rhythm: Normal rate and regular rhythm.     Heart sounds: No murmur heard.   Pulmonary:     Effort: Pulmonary effort is normal.     Breath sounds: No stridor. No wheezing, rhonchi or rales.  Abdominal:     General: Abdomen is protuberant. Bowel sounds are normal. There is no distension.     Palpations: Abdomen is soft. There  is no hepatomegaly, splenomegaly or mass.     Tenderness: There is no abdominal tenderness.  Musculoskeletal:        General: Normal range of motion.     Cervical back: Neck supple.     Right lower leg: No edema.     Left lower leg: No edema.  Lymphadenopathy:     Cervical: No cervical adenopathy.  Skin:    General: Skin is warm and dry.     Coloration: Skin is not pale.  Neurological:     General: No focal deficit present.     Mental Status: She is alert.  Psychiatric:        Mood and Affect: Mood normal.        Behavior: Behavior normal.     Lab Results  Component Value Date   WBC 8.0 03/09/2021   HGB 12.6 03/09/2021   HCT 37.7 03/09/2021   PLT 349.0 03/09/2021   GLUCOSE 112 (H) 03/09/2021   CHOL 165 03/09/2021   TRIG 173.0 (H) 03/09/2021   HDL 37.50 (L) 03/09/2021   LDLDIRECT 82.0 08/28/2018   LDLCALC 93 03/09/2021   ALT 14 03/09/2021   AST 14 03/09/2021   NA 140 03/09/2021   K 4.0 03/09/2021   CL 104 03/09/2021   CREATININE 0.94 03/09/2021   BUN 19 03/09/2021   CO2 31 03/09/2021   TSH 0.37 03/09/2021   HGBA1C 6.4 03/09/2021    CT CHEST LUNG CA SCREEN LOW DOSE W/O CM  Result Date: 06/08/2020 CLINICAL DATA:  Current smoker, 30 pack-year history. EXAM: CT CHEST WITHOUT CONTRAST LOW-DOSE FOR LUNG CANCER SCREENING TECHNIQUE: Multidetector CT imaging of the chest was performed following the standard protocol without IV contrast. COMPARISON:  None. FINDINGS: Cardiovascular: Heart is at the upper limits of normal in size to mildly enlarged. No pericardial effusion. Mediastinum/Nodes: No pathologically enlarged mediastinal or axillary lymph nodes. Hilar regions are difficult to definitively evaluate without IV contrast appear grossly unremarkable. Esophagus is grossly unremarkable. Lungs/Pleura: Smoking related respiratory bronchiolitis. Mild centrilobular and paraseptal emphysema. No worrisome pulmonary nodules. Scarring in the right middle lobe and lingula. No pleural  fluid. Airway is unremarkable. Upper Abdomen: Visualized portions of the liver, adrenal glands, kidneys, spleen, pancreas, stomach and bowel are grossly unremarkable. Musculoskeletal: No worrisome lytic or sclerotic lesions. IMPRESSION: 1. Lung-RADS 1, negative. Continue annual screening with low-dose chest CT without contrast in 12 months. 2.  Emphysema (ICD10-J43.9). Electronically Signed   By: Lorin Picket M.D.   On: 06/08/2020 11:14    Assessment & Plan:   Chamara was seen today for annual exam, hypertension, hypothyroidism and cough.  Diagnoses and all orders for this visit:  Essential hypertension- She has not achieved her blood pressure goal of 130/80.  She agrees to be more compliant with the diuretic and to improve her lifestyle modifications. -     Basic metabolic panel; Future -     Hepatic function panel; Future -  Hepatic function panel -     Basic metabolic panel  Acquired hypothyroidism- Her TSH is in the normal range.  She will stay on the current dose of levothyroxine. -     TSH; Future -     TSH  B12 nutritional deficiency- Her B12 level is low.  She agrees to restart parenteral B12 replacement therapy. -     CBC with Differential/Platelet; Future -     Vitamin B12; Future -     Folate; Future -     Folate -     Vitamin B12 -     CBC with Differential/Platelet  Routine general medical examination at a health care facility- Exam completed, labs reviewed, vaccines reviewed and updated, cancer screenings addressed, patient education was given. -     Lipid panel; Future -     Lipid panel  Vitamin D deficiency disease -     VITAMIN D 25 Hydroxy (Vit-D Deficiency, Fractures); Future -     VITAMIN D 25 Hydroxy (Vit-D Deficiency, Fractures)  Prediabetes- Her A1c is up to 6.4%.  She was encouraged to improve her lifestyle modifications. -     Hemoglobin A1c; Future -     Hemoglobin A1c  Colon cancer screening -     Cologuard  Centrilobular emphysema ([place]) -      Fluticasone-Umeclidin-Vilant (TRELEGY ELLIPTA) 100-62.5-25 MCG/INH AEPB; Inhale 1 puff into the lungs daily.  Simple chronic bronchitis ([place]) -     Fluticasone-Umeclidin-Vilant (TRELEGY ELLIPTA) 100-62.5-25 MCG/INH AEPB; Inhale 1 puff into the lungs daily.  Visit for screening mammogram -     MM DIGITAL SCREENING BILATERAL; Future  Need for vaccination -     Pneumococcal conjugate vaccine 20-valent (Prevnar 20)  Other folate deficiency anemias -     folic acid (FOLVITE) 1 MG tablet; Take 1 tablet (1 mg total) by mouth daily.  Chronic renal disease, stage 2, mildly decreased glomerular filtration rate (GFR) between 60-89- Her renal function is normal now.  mL/min/1.73 square meter   I have discontinued Jeralene Huff. Cogan's Cholecalciferol and Breztri Aerosphere. I am also having her start on Trelegy Ellipta and folic acid. Additionally, I am having her maintain her fluticasone, levocetirizine, albuterol, Edarbi, levothyroxine, and triamterene-hydrochlorothiazide.  Meds ordered this encounter  Medications  . Fluticasone-Umeclidin-Vilant (TRELEGY ELLIPTA) 100-62.5-25 MCG/INH AEPB    Sig: Inhale 1 puff into the lungs daily.    Dispense:  120 each    Refill:  1  . folic acid (FOLVITE) 1 MG tablet    Sig: Take 1 tablet (1 mg total) by mouth daily.    Dispense:  90 tablet    Refill:  1     Follow-up: Return in about 6 months (around 09/09/2021).  Scarlette Calico, MD

## 2021-03-09 NOTE — Patient Instructions (Signed)
Health Maintenance, Female Adopting a healthy lifestyle and getting preventive care are important in promoting health and wellness. Ask your health care provider about:  The right schedule for you to have regular tests and exams.  Things you can do on your own to prevent diseases and keep yourself healthy. What should I know about diet, weight, and exercise? Eat a healthy diet  Eat a diet that includes plenty of vegetables, fruits, low-fat dairy products, and lean protein.  Do not eat a lot of foods that are high in solid fats, added sugars, or sodium.   Maintain a healthy weight Body mass index (BMI) is used to identify weight problems. It estimates body fat based on height and weight. Your health care provider can help determine your BMI and help you achieve or maintain a healthy weight. Get regular exercise Get regular exercise. This is one of the most important things you can do for your health. Most adults should:  Exercise for at least 150 minutes each week. The exercise should increase your heart rate and make you sweat (moderate-intensity exercise).  Do strengthening exercises at least twice a week. This is in addition to the moderate-intensity exercise.  Spend less time sitting. Even light physical activity can be beneficial. Watch cholesterol and blood lipids Have your blood tested for lipids and cholesterol at 56 years of age, then have this test every 5 years. Have your cholesterol levels checked more often if:  Your lipid or cholesterol levels are high.  You are older than 56 years of age.  You are at high risk for heart disease. What should I know about cancer screening? Depending on your health history and family history, you may need to have cancer screening at various ages. This may include screening for:  Breast cancer.  Cervical cancer.  Colorectal cancer.  Skin cancer.  Lung cancer. What should I know about heart disease, diabetes, and high blood  pressure? Blood pressure and heart disease  High blood pressure causes heart disease and increases the risk of stroke. This is more likely to develop in people who have high blood pressure readings, are of African descent, or are overweight.  Have your blood pressure checked: ? Every 3-5 years if you are 18-39 years of age. ? Every year if you are 40 years old or older. Diabetes Have regular diabetes screenings. This checks your fasting blood sugar level. Have the screening done:  Once every three years after age 40 if you are at a normal weight and have a low risk for diabetes.  More often and at a younger age if you are overweight or have a high risk for diabetes. What should I know about preventing infection? Hepatitis B If you have a higher risk for hepatitis B, you should be screened for this virus. Talk with your health care provider to find out if you are at risk for hepatitis B infection. Hepatitis C Testing is recommended for:  Everyone born from 1945 through 1965.  Anyone with known risk factors for hepatitis C. Sexually transmitted infections (STIs)  Get screened for STIs, including gonorrhea and chlamydia, if: ? You are sexually active and are younger than 56 years of age. ? You are older than 56 years of age and your health care provider tells you that you are at risk for this type of infection. ? Your sexual activity has changed since you were last screened, and you are at increased risk for chlamydia or gonorrhea. Ask your health care provider   if you are at risk.  Ask your health care provider about whether you are at high risk for HIV. Your health care provider may recommend a prescription medicine to help prevent HIV infection. If you choose to take medicine to prevent HIV, you should first get tested for HIV. You should then be tested every 3 months for as long as you are taking the medicine. Pregnancy  If you are about to stop having your period (premenopausal) and  you may become pregnant, seek counseling before you get pregnant.  Take 400 to 800 micrograms (mcg) of folic acid every day if you become pregnant.  Ask for birth control (contraception) if you want to prevent pregnancy. Osteoporosis and menopause Osteoporosis is a disease in which the bones lose minerals and strength with aging. This can result in bone fractures. If you are 65 years old or older, or if you are at risk for osteoporosis and fractures, ask your health care provider if you should:  Be screened for bone loss.  Take a calcium or vitamin D supplement to lower your risk of fractures.  Be given hormone replacement therapy (HRT) to treat symptoms of menopause. Follow these instructions at home: Lifestyle  Do not use any products that contain nicotine or tobacco, such as cigarettes, e-cigarettes, and chewing tobacco. If you need help quitting, ask your health care provider.  Do not use street drugs.  Do not share needles.  Ask your health care provider for help if you need support or information about quitting drugs. Alcohol use  Do not drink alcohol if: ? Your health care provider tells you not to drink. ? You are pregnant, may be pregnant, or are planning to become pregnant.  If you drink alcohol: ? Limit how much you use to 0-1 drink a day. ? Limit intake if you are breastfeeding.  Be aware of how much alcohol is in your drink. In the [place] one drink equals one 12 oz bottle of beer (355 mL), one 5 oz glass of wine (148 mL), or one 1 oz glass of hard liquor (44 mL). General instructions  Schedule regular health, dental, and eye exams.  Stay current with your vaccines.  Tell your health care provider if: ? You often feel depressed. ? You have ever been abused or do not feel safe at home. Summary  Adopting a healthy lifestyle and getting preventive care are important in promoting health and wellness.  Follow your health care provider's instructions about healthy  diet, exercising, and getting tested or screened for diseases.  Follow your health care provider's instructions on monitoring your cholesterol and blood pressure. This information is not intended to replace advice given to you by your health care provider. Make sure you discuss any questions you have with your health care provider. Document Revised: 10/08/2018 Document Reviewed: 10/08/2018 Elsevier Patient Education  2021 [employer]  

## 2021-03-10 DIAGNOSIS — E538 Deficiency of other specified B group vitamins: Secondary | ICD-10-CM

## 2021-03-10 MED ORDER — CYANOCOBALAMIN 1000 MCG/ML IJ SOLN
1000.0000 ug | INTRAMUSCULAR | Status: DC
  Administered 2021-03-15 – 2021-04-19 (×2): 1000 ug via INTRAMUSCULAR

## 2021-03-10 NOTE — Telephone Encounter (Signed)
B12 order has been entered for Q30days.

## 2021-03-15 ENCOUNTER — Other Ambulatory Visit: Payer: Self-pay

## 2021-03-15 ENCOUNTER — Ambulatory Visit (INDEPENDENT_AMBULATORY_CARE_PROVIDER_SITE_OTHER): Payer: BC Managed Care – PPO

## 2021-03-15 DIAGNOSIS — E538 Deficiency of other specified B group vitamins: Secondary | ICD-10-CM

## 2021-03-15 DIAGNOSIS — Z1211 Encounter for screening for malignant neoplasm of colon: Secondary | ICD-10-CM | POA: Diagnosis not present

## 2021-03-15 LAB — COLOGUARD: Cologuard: NEGATIVE

## 2021-03-15 NOTE — Progress Notes (Signed)
B12 Given 

## 2021-03-23 LAB — COLOGUARD: Cologuard: NEGATIVE

## 2021-04-19 ENCOUNTER — Ambulatory Visit (INDEPENDENT_AMBULATORY_CARE_PROVIDER_SITE_OTHER): Payer: BC Managed Care – PPO

## 2021-04-19 ENCOUNTER — Other Ambulatory Visit: Payer: Self-pay

## 2021-04-19 DIAGNOSIS — E538 Deficiency of other specified B group vitamins: Secondary | ICD-10-CM

## 2021-04-19 NOTE — Progress Notes (Addendum)
Patient came into the office to receive her b12 injection. She tolerated the injection well. No questions or concerns.    I have reviewed and agree

## 2021-05-08 ENCOUNTER — Other Ambulatory Visit: Payer: Self-pay | Admitting: Internal Medicine

## 2021-05-08 DIAGNOSIS — E876 Hypokalemia: Secondary | ICD-10-CM

## 2021-05-08 DIAGNOSIS — I10 Essential (primary) hypertension: Secondary | ICD-10-CM

## 2021-05-08 DIAGNOSIS — E039 Hypothyroidism, unspecified: Secondary | ICD-10-CM

## 2021-06-12 ENCOUNTER — Telehealth: Payer: Self-pay | Admitting: Acute Care

## 2021-06-12 DIAGNOSIS — F17210 Nicotine dependence, cigarettes, uncomplicated: Secondary | ICD-10-CM

## 2021-06-12 DIAGNOSIS — Z87891 Personal history of nicotine dependence: Secondary | ICD-10-CM

## 2021-06-12 NOTE — Telephone Encounter (Signed)
Will forward to [facility]) to contact pt to schedule Ct. New CT order has been placed.

## 2021-06-12 NOTE — Telephone Encounter (Signed)
I have spoke with the patient and her LCS CT has been scheduled at [place] location on 06/27/2021 @ 3:40pm and she is aware of the appt

## 2021-06-27 ENCOUNTER — Other Ambulatory Visit: Payer: Self-pay | Admitting: Internal Medicine

## 2021-06-27 ENCOUNTER — Ambulatory Visit
Admission: RE | Admit: 2021-06-27 | Discharge: 2021-06-27 | Disposition: A | Discharge status: Home / Self Care | Payer: BC Managed Care – PPO | Source: Ambulatory Visit | Attending: Acute Care | Admitting: Acute Care

## 2021-06-27 DIAGNOSIS — J984 Other disorders of lung: Secondary | ICD-10-CM | POA: Diagnosis not present

## 2021-06-27 DIAGNOSIS — F17210 Nicotine dependence, cigarettes, uncomplicated: Secondary | ICD-10-CM

## 2021-06-27 DIAGNOSIS — J432 Centrilobular emphysema: Secondary | ICD-10-CM | POA: Diagnosis not present

## 2021-06-27 DIAGNOSIS — J301 Allergic rhinitis due to pollen: Secondary | ICD-10-CM

## 2021-06-27 DIAGNOSIS — Z87891 Personal history of nicotine dependence: Secondary | ICD-10-CM

## 2021-07-04 ENCOUNTER — Other Ambulatory Visit: Payer: Self-pay

## 2021-07-04 DIAGNOSIS — J301 Allergic rhinitis due to pollen: Secondary | ICD-10-CM

## 2021-07-06 NOTE — Progress Notes (Signed)
Please call patient and let them  know their  low dose Ct was read as a Lung RADS 1, negative study: no nodules or definitely benign nodules. Radiology recommendation is for a repeat LDCT in 12 months. .Please let them  know we will order and schedule their  annual screening scan for 06/2022. Pt.  is  not  currently on statin therapy. Please place order for annual  screening scan for  06/2022 and fax results to PCP. Thanks so much.

## 2021-07-07 ENCOUNTER — Encounter: Payer: Self-pay | Admitting: *Deleted

## 2021-07-07 DIAGNOSIS — Z87891 Personal history of nicotine dependence: Secondary | ICD-10-CM

## 2021-07-07 DIAGNOSIS — F17210 Nicotine dependence, cigarettes, uncomplicated: Secondary | ICD-10-CM

## 2021-08-10 ENCOUNTER — Other Ambulatory Visit: Payer: Self-pay | Admitting: Internal Medicine

## 2021-08-10 DIAGNOSIS — E039 Hypothyroidism, unspecified: Secondary | ICD-10-CM

## 2021-08-10 DIAGNOSIS — I10 Essential (primary) hypertension: Secondary | ICD-10-CM

## 2021-08-10 DIAGNOSIS — E876 Hypokalemia: Secondary | ICD-10-CM

## 2021-08-19 ENCOUNTER — Emergency Department (HOSPITAL_BASED_OUTPATIENT_CLINIC_OR_DEPARTMENT_OTHER): Payer: BC Managed Care – PPO

## 2021-08-19 ENCOUNTER — Other Ambulatory Visit: Payer: Self-pay

## 2021-08-19 ENCOUNTER — Encounter (HOSPITAL_BASED_OUTPATIENT_CLINIC_OR_DEPARTMENT_OTHER): Payer: Self-pay | Admitting: Emergency Medicine

## 2021-08-19 ENCOUNTER — Emergency Department (HOSPITAL_BASED_OUTPATIENT_CLINIC_OR_DEPARTMENT_OTHER)
Admission: EM | Admit: 2021-08-19 | Discharge: 2021-08-19 | Disposition: A | Discharge status: Home / Self Care | Payer: BC Managed Care – PPO | Attending: Emergency Medicine | Admitting: Emergency Medicine

## 2021-08-19 DIAGNOSIS — N182 Chronic kidney disease, stage 2 (mild): Secondary | ICD-10-CM | POA: Insufficient documentation

## 2021-08-19 DIAGNOSIS — Z79899 Other long term (current) drug therapy: Secondary | ICD-10-CM | POA: Diagnosis not present

## 2021-08-19 DIAGNOSIS — Z87891 Personal history of nicotine dependence: Secondary | ICD-10-CM | POA: Insufficient documentation

## 2021-08-19 DIAGNOSIS — R0602 Shortness of breath: Secondary | ICD-10-CM | POA: Diagnosis not present

## 2021-08-19 DIAGNOSIS — R062 Wheezing: Secondary | ICD-10-CM | POA: Diagnosis not present

## 2021-08-19 DIAGNOSIS — R059 Cough, unspecified: Secondary | ICD-10-CM | POA: Insufficient documentation

## 2021-08-19 DIAGNOSIS — Z7951 Long term (current) use of inhaled steroids: Secondary | ICD-10-CM | POA: Insufficient documentation

## 2021-08-19 DIAGNOSIS — J449 Chronic obstructive pulmonary disease, unspecified: Secondary | ICD-10-CM | POA: Insufficient documentation

## 2021-08-19 DIAGNOSIS — I129 Hypertensive chronic kidney disease with stage 1 through stage 4 chronic kidney disease, or unspecified chronic kidney disease: Secondary | ICD-10-CM | POA: Diagnosis not present

## 2021-08-19 DIAGNOSIS — R051 Acute cough: Secondary | ICD-10-CM

## 2021-08-19 DIAGNOSIS — Z20822 Contact with and (suspected) exposure to covid-19: Secondary | ICD-10-CM | POA: Insufficient documentation

## 2021-08-19 DIAGNOSIS — E039 Hypothyroidism, unspecified: Secondary | ICD-10-CM | POA: Diagnosis not present

## 2021-08-19 LAB — RESP PANEL BY RT-PCR (FLU A&B, COVID) ARPGX2
Influenza A by PCR: NEGATIVE
Influenza B by PCR: NEGATIVE
SARS Coronavirus 2 by RT PCR: NEGATIVE

## 2021-08-19 MED ORDER — AEROCHAMBER PLUS FLO-VU MEDIUM MISC
1.0000 | Freq: Once | Status: AC
  Administered 2021-08-19: 1
  Filled 2021-08-19: qty 1

## 2021-08-19 MED ORDER — BENZONATATE 100 MG PO CAPS: 100.0000 mg | ORAL_CAPSULE | Freq: Three times a day (TID) | ORAL | 0 refills | Status: DC

## 2021-08-19 MED ORDER — ALBUTEROL SULFATE HFA 108 (90 BASE) MCG/ACT IN AERS
4.0000 | INHALATION_SPRAY | Freq: Once | RESPIRATORY_TRACT | Status: AC
  Administered 2021-08-19: 4 via RESPIRATORY_TRACT
  Filled 2021-08-19: qty 6.7

## 2021-08-19 MED ORDER — PROMETHAZINE-DM 6.25-15 MG/5ML PO SYRP: 5.0000 mL | ORAL_SOLUTION | Freq: Four times a day (QID) | ORAL | 0 refills | Status: DC | PRN

## 2021-08-19 MED ORDER — PREDNISONE 10 MG PO TABS: 40.0000 mg | ORAL_TABLET | Freq: Every day | ORAL | 0 refills | Status: AC

## 2021-08-19 MED ORDER — PREDNISONE 20 MG PO TABS
40.0000 mg | ORAL_TABLET | Freq: Once | ORAL | Status: AC
  Administered 2021-08-19: 40 mg via ORAL
  Filled 2021-08-19: qty 2

## 2021-08-19 NOTE — ED Provider Notes (Signed)
[facility] EMERGENCY DEPARTMENT Provider Note   CSN: [id number] Arrival date & time: 08/19/21  1340     History Chief Complaint  Patient presents with   Cough   Shortness of Breath    Sarah Watts is a 55 y.o. female.  HPI Patient with cough, shortness of breath, wheezing for the past 4 days.  Her cough has been productive of yellow/green sputum she has had subjective fevers and chills at home but temperature not above 99  She denies any chest pain improved when she is coughing.  Denies any abdominal pain nausea or vomiting.  She states 2 days ago she got a PCR test for COVID which was negative.  She states she has a history of COPD although she is not on her prescribed steroid inhaler because she cannot afford this.  She states she does use an albuterol inhaler occasionally has been using this more often over the past few days.  No recent surgeries, hospitalization, long travel, hemoptysis, estrogen containing OCP, cancer history.  No unilateral leg swelling.  No history of PE or VTE.      Past Medical History:  Diagnosis Date   Allergic rhinitis    Heart murmur    HTN (hypertension)    Irregular heartbeat     Patient Active Problem List   Diagnosis Date Noted   Simple chronic bronchitis ([place]) 12/20/2020   Centrilobular emphysema ([place]) 06/14/2020   Encounter for screening for lung cancer 06/08/2020   Chronic renal disease, stage 2, mildly decreased glomerular filtration rate (GFR) between 60-89 mL/min/1.73 square meter 04/09/2020   Colon cancer screening 10/08/2019   Vitamin D deficiency disease 10/08/2019   B12 nutritional deficiency 04/04/2017   Current severe episode of major depressive disorder without psychotic features without prior episode ([place]) 03/18/2017   Routine general medical examination at a health care facility 03/12/2016   Tobacco abuse 12/01/2015   Prediabetes 03/03/2013   Visit for screening mammogram 02/13/2013   Spinal stenosis in  cervical region 02/13/2013   Hypothyroid 01/18/2011   OBSTRUCTIVE SLEEP APNEA 01/09/2011   Essential hypertension 12/19/2010   Allergic rhinitis 12/19/2010    Past Surgical History:  Procedure Laterality Date   APPENDECTOMY     CESAREAN SECTION     CHOLECYSTECTOMY     FACIAL RECONSTRUCTION SURGERY     Left, due to birth defect   TONSILLECTOMY       OB History   No obstetric history on file.     Family History  Problem Relation Age of Onset   Diabetes Other        1st degree relative   Hypertension Other    Prostate cancer Other        1st degree relative <50   Allergies Mother    Breast cancer Mother    Allergies Father    Heart disease Father    Allergies Sister    Asthma Sister    Colon cancer Neg Hx     Social History   Tobacco Use   Smoking status: Former    Packs/day: 0.50    Years: 15.00    Pack years: 7.50    Types: Cigarettes    Quit date: 06/07/2020    Years since quitting: 1.2   Smokeless tobacco: Never  Vaping Use   Vaping Use: Former  Substance Use Topics   Alcohol use: No   Drug use: No    Home Medications Prior to Admission medications   Medication Sig Start Date  End Date Taking? Authorizing Provider  benzonatate (TESSALON) 100 MG capsule Take 1 capsule (100 mg total) by mouth every 8 (eight) hours. 08/19/21  Yes Khalea Ventura S, PA  predniSONE (DELTASONE) 10 MG tablet Take 4 tablets (40 mg total) by mouth daily for 5 days. 08/19/21 08/24/21 Yes Vayden Weinand S, PA  promethazine-dextromethorphan (PROMETHAZINE-DM) 6.25-15 MG/5ML syrup Take 5 mLs by mouth 4 (four) times daily as needed for cough. 08/19/21  Yes Lorien Shingler S, PA  albuterol (VENTOLIN HFA) 108 (90 Base) MCG/ACT inhaler Inhale 2 puffs into the lungs every 6 (six) hours as needed for wheezing or shortness of breath. 10/26/20   Hawks, Christy A, FNP  EDARBI 80 MG TABS TAKE 1 TABLET ONCE DAILY. 11/23/20   Janith Lima, MD  fluticasone Asencion [facility]) 50 MCG/ACT nasal spray Use 2  spray(s) in each nostril once daily 06/27/21   Janith Lima, MD  Fluticasone-Umeclidin-Vilant (TRELEGY ELLIPTA) 100-62.5-25 MCG/INH AEPB Inhale 1 puff into the lungs daily. 03/09/21   Janith Lima, MD  folic acid (FOLVITE) 1 MG tablet Take 1 tablet (1 mg total) by mouth daily. 03/09/21   Janith Lima, MD  levocetirizine (XYZAL) 5 MG tablet Take 1 tablet (5 mg total) by mouth every evening. 06/14/20   Janith Lima, MD  levothyroxine (SYNTHROID) 75 MCG tablet Take 1 tablet by mouth once daily 08/11/21   Janith Lima, MD  triamterene-hydrochlorothiazide [facility]) 37.5-25 MG capsule Take 1 capsule by mouth once daily 08/11/21   Janith Lima, MD    Allergies    Shellfish allergy, Codeine, and Tramadol  Review of Systems   Review of Systems  Constitutional:  Positive for chills and fatigue. Negative for fever.  HENT:  Negative for congestion.   Eyes:  Negative for pain.  Respiratory:  Positive for cough. Negative for shortness of breath.   Cardiovascular:  Negative for chest pain and leg swelling.  Gastrointestinal:  Negative for abdominal pain, diarrhea, nausea and vomiting.  Genitourinary:  Negative for dysuria.  Musculoskeletal:  Negative for myalgias.  Skin:  Negative for rash.  Neurological:  Negative for dizziness and headaches.   Physical Exam Updated Vital Signs BP (!) 153/84 (BP Location: Left Arm)   Pulse 69   Temp 98.5 F (36.9 C) (Oral)   Resp (!) 22   Ht 5' (1.524 m)   Wt 74.8 kg   SpO2 97%   BMI 32.22 kg/m   Physical Exam Vitals and nursing note reviewed.  Constitutional:      General: She is not in acute distress.    Comments: Pleasant well-appearing 55 year old.  In no acute distress.  Sitting comfortably in bed.  Able answer questions appropriately follow commands. No increased work of breathing. Speaking in full sentences.   HENT:     Head: Normocephalic and atraumatic.     Nose: Nose normal.     Mouth/Throat:     Mouth: Mucous membranes are  moist.  Eyes:     General: No scleral icterus. Cardiovascular:     Rate and Rhythm: Normal rate and regular rhythm.     Pulses: Normal pulses.     Heart sounds: Normal heart sounds.  Pulmonary:     Effort: Pulmonary effort is normal. No respiratory distress.     Breath sounds: Wheezing present.     Comments: Coarse lung sounds throughout.  Wheezing auscultated diffusely.  Speaking full sentences.  No increased work of breathing. Abdominal:     Palpations: Abdomen is soft.  Tenderness: There is no abdominal tenderness.  Musculoskeletal:     Cervical back: Normal range of motion.     Right lower leg: No edema.     Left lower leg: No edema.     Comments: Lower extremities without edema or calf tenderness  Skin:    General: Skin is warm and dry.     Capillary Refill: Capillary refill takes less than 2 seconds.  Neurological:     Mental Status: She is alert. Mental status is at baseline.  Psychiatric:        Mood and Affect: Mood normal.        Behavior: Behavior normal.    ED Results / Procedures / Treatments   Labs (all labs ordered are listed, but only abnormal results are displayed) Labs Reviewed  RESP PANEL BY RT-PCR (FLU A&B, COVID) [id number]    EKG None  Radiology DG Chest Portable 1 View  Result Date: 08/19/2021 CLINICAL DATA:  55 year old female with cough. EXAM: PORTABLE CHEST - 1 VIEW COMPARISON:  11/07/2015, 06/27/2021 FINDINGS: The mediastinal contours are within normal limits. No cardiomegaly. The lungs are clear bilaterally without evidence of focal consolidation, pleural effusion, or pneumothorax. No acute osseous abnormality. IMPRESSION: No acute cardiopulmonary process. Electronically Signed   By: Ruthann Cancer M.D.   On: 08/19/2021 15:00    Procedures Procedures   Medications Ordered in ED Medications  albuterol (VENTOLIN HFA) 108 (90 Base) MCG/ACT inhaler 4 puff (4 puffs Inhalation Given 08/19/21 1511)  AeroChamber Plus Flo-Vu Medium MISC 1 each (1  each Other Given 08/19/21 1513)  predniSONE (DELTASONE) tablet 40 mg (40 mg Oral Given 08/19/21 1521)    ED Course  I have reviewed the triage vital signs and the nursing notes.  Pertinent labs & imaging results that were available during my care of the patient were reviewed by me and considered in my medical decision making (see chart for details).    MDM Rules/Calculators/A&P                          Patient history of COPD not using steroid inhaler because of financial difficulties does use albuterol however states she is almost out she has been experiencing cough wheezing and shortness of breath over the past 4 days.  No hemoptysis no chest pain.  On physical examination patient has coarse lung sounds and some wheezing.  Given albuterol inhaler 4 puffs with spacer by RT and prednisone 40 mg.  Reassessed for his evidence later patient no longer is wheezing still has mildly coarse lung sounds states that she feels that she is breathing more easily.  On reassessment she is not tachypneic has no increased work of breathing inspiratory aspiratory length is appropriate.  COVID influenza negative.  Chest x-ray unremarkable no infiltrate.  Recommended follow-up with PCP.  Will need to talk more about ICS that may be less expensive.  She was prescribed Trelegy which is a more expensive ICS.  She understands that she needs to have this discussion with her PCP.  All questions answered best my ability.  Return precautions given.  Final Clinical Impression(s) / ED Diagnoses Final diagnoses:  Acute cough  Wheezing    Rx / DC Orders ED Discharge Orders          Ordered    predniSONE (DELTASONE) 10 MG tablet  Daily        08/19/21 1455    promethazine-dextromethorphan (PROMETHAZINE-DM) 6.25-15 MG/5ML syrup  4 times daily  PRN        08/19/21 1455    benzonatate (TESSALON) 100 MG capsule  Every 8 hours        08/19/21 1455             Pati Gallo [place], [place] 08/19/21 1709     Gareth Morgan, MD 08/21/21 1828

## 2021-08-19 NOTE — Discharge Instructions (Addendum)
Please follow-up with your primary care provider and discuss specifically steroid inhalers that you could be prescribed that are more affordable.

## 2021-08-19 NOTE — ED Triage Notes (Signed)
Pt c/o cough with shortness of breath x 4 days. Pt had COVID test by [facility] yesterday that was negative.

## 2021-08-30 ENCOUNTER — Telehealth: Payer: BC Managed Care – PPO | Admitting: Physician Assistant

## 2021-08-30 DIAGNOSIS — J441 Chronic obstructive pulmonary disease with (acute) exacerbation: Secondary | ICD-10-CM

## 2021-08-30 MED ORDER — PREDNISONE 20 MG PO TABS: 40.0000 mg | ORAL_TABLET | Freq: Every day | ORAL | 0 refills | Status: DC

## 2021-08-30 MED ORDER — DOXYCYCLINE HYCLATE 100 MG PO TABS: 100.0000 mg | ORAL_TABLET | Freq: Two times a day (BID) | ORAL | 0 refills | Status: DC

## 2021-08-30 MED ORDER — PROMETHAZINE-DM 6.25-15 MG/5ML PO SYRP: 5.0000 mL | ORAL_SOLUTION | Freq: Four times a day (QID) | ORAL | 0 refills | Status: DC | PRN

## 2021-08-30 NOTE — Patient Instructions (Signed)
Sarah Watts, thank you for joining Leeanne Rio, PA-C for today's virtual visit.  While this provider is not your primary care provider (PCP), if your PCP is located in our provider database this encounter information will be shared with them immediately following your visit.  Consent: (Patient) Sarah Watts provided verbal consent for this virtual visit at the beginning of the encounter.  Current Medications:  Current Outpatient Medications:    albuterol (VENTOLIN HFA) 108 (90 Base) MCG/ACT inhaler, Inhale 2 puffs into the lungs every 6 (six) hours as needed for wheezing or shortness of breath., Disp: 8 g, Rfl: 0   benzonatate (TESSALON) 100 MG capsule, Take 1 capsule (100 mg total) by mouth every 8 (eight) hours., Disp: 21 capsule, Rfl: 0   EDARBI 80 MG TABS, TAKE 1 TABLET ONCE DAILY., Disp: 30 tablet, Rfl: 5   fluticasone (FLONASE) 50 MCG/ACT nasal spray, Use 2 spray(s) in each nostril once daily, Disp: 48 g, Rfl: 1   Fluticasone-Umeclidin-Vilant (TRELEGY ELLIPTA) 100-62.5-25 MCG/INH AEPB, Inhale 1 puff into the lungs daily., Disp: 120 each, Rfl: 1   folic acid (FOLVITE) 1 MG tablet, Take 1 tablet (1 mg total) by mouth daily., Disp: 90 tablet, Rfl: 1   levocetirizine (XYZAL) 5 MG tablet, Take 1 tablet (5 mg total) by mouth every evening., Disp: 90 tablet, Rfl: 1   levothyroxine (SYNTHROID) 75 MCG tablet, Take 1 tablet by mouth once daily, Disp: 90 tablet, Rfl: 0   promethazine-dextromethorphan (PROMETHAZINE-DM) 6.25-15 MG/5ML syrup, Take 5 mLs by mouth 4 (four) times daily as needed for cough., Disp: 118 mL, Rfl: 0   triamterene-hydrochlorothiazide (DYAZIDE) 37.5-25 MG capsule, Take 1 capsule by mouth once daily, Disp: 90 capsule, Rfl: 0  Current Facility-Administered Medications:    cyanocobalamin ((VITAMIN B-12)) injection 1,000 mcg, 1,000 mcg, Intramuscular, Q30 days, Janith Lima, MD, 1,000 mcg at 04/19/21 1452   Medications ordered in this encounter:  No orders of  the defined types were placed in this encounter.    *If you need refills on other medications prior to your next appointment, please contact your pharmacy*  Follow-Up: Call back or seek an in-person evaluation if the symptoms worsen or if the condition fails to improve as anticipated.  Other Instructions Take antibiotic (doxycycline) as directed.  Increase fluids.  Get plenty of rest. Use Mucinex for congestion. Use the Prednisone and cough medication as directed. Take a daily probiotic (I recommend Align or Culturelle, but even Activia Yogurt may be beneficial).  A humidifier placed in the bedroom may offer some relief for a dry, scratchy throat of nasal irritation.  Read information below on acute bronchitis.   If anything worsens you need repeat evaluation in person at Urgent Care or ER facility.  Acute Bronchitis Bronchitis is when the airways that extend from the windpipe into the lungs get red, puffy, and painful (inflamed). Bronchitis often causes thick spit (mucus) to develop. This leads to a cough. A cough is the most common symptom of bronchitis. In acute bronchitis, the condition usually begins suddenly and goes away over time (usually in 2 weeks). Smoking, allergies, and asthma can make bronchitis worse. Repeated episodes of bronchitis may cause more lung problems.  HOME CARE Rest. Drink enough fluids to keep your pee (urine) clear or pale yellow (unless you need to limit fluids as told by your doctor). Only take over-the-counter or prescription medicines as told by your doctor. Avoid smoking and secondhand smoke. These can make bronchitis worse. If you are a  smoker, think about using nicotine gum or skin patches. Quitting smoking will help your lungs heal faster. Reduce the chance of getting bronchitis again by: Washing your hands often. Avoiding people with cold symptoms. Trying not to touch your hands to your mouth, nose, or eyes. Follow up with your doctor as told.  GET  HELP IF: Your symptoms do not improve after 1 week of treatment. Symptoms include: Cough. Fever. Coughing up thick spit. Body aches. Chest congestion. Chills. Shortness of breath. Sore throat.  GET HELP RIGHT AWAY IF:  You have an increased fever. You have chills. You have severe shortness of breath. You have bloody thick spit (sputum). You throw up (vomit) often. You lose too much body fluid (dehydration). You have a severe headache. You faint.  MAKE SURE YOU:  Understand these instructions. Will watch your condition. Will get help right away if you are not doing well or get worse. Document Released: 04/02/2008 Document Revised: 06/17/2013 Document Reviewed: 04/07/2013 [facility] Patient Information 2015 [place], [place]. This information is not intended to replace advice given to you by your health care provider. Make sure you discuss any questions you have with your health care provider.    If you have been instructed to have an in-person evaluation today at a local Urgent Care facility, please use the link below. It will take you to a list of all of our available [facility], including address, phone number and hours of operation. Please do not delay care.  [facility]  If you or a family member do not have a primary care provider, use the link below to schedule a visit and establish care. When you choose a [facility] primary care physician or advanced practice provider, you gain a long-term partner in health. Find a Primary Care Provider  Learn more about [facility]'s in-office and virtual care options: [facility]

## 2021-08-30 NOTE — Progress Notes (Signed)
Virtual Visit Consent   VELNA HEDGECOCK, you are scheduled for a virtual visit with a [facility] provider today.     Just as with appointments in the office, your consent must be obtained to participate.  Your consent will be active for this visit and any virtual visit you may have with one of our providers in the next 365 days.     If you have a MyChart account, a copy of this consent can be sent to you electronically.  All virtual visits are billed to your insurance company just like a traditional visit in the office.    As this is a virtual visit, video technology does not allow for your provider to perform a traditional examination.  This may limit your provider's ability to fully assess your condition.  If your provider identifies any concerns that need to be evaluated in person or the need to arrange testing (such as labs, EKG, etc.), we will make arrangements to do so.     Although advances in technology are sophisticated, we cannot ensure that it will always work on either your end or our end.  If the connection with a video visit is poor, the visit may have to be switched to a telephone visit.  With either a video or telephone visit, we are not always able to ensure that we have a secure connection.     I need to obtain your verbal consent now.   Are you willing to proceed with your visit today?    RENIA MIKELSON has provided verbal consent on 08/30/2021 for a virtual visit (video or telephone).   Leeanne Rio, [place]   Date: 08/30/2021 2:41 PM   Virtual Visit via Video Note   I, Leeanne Rio, connected with  ADILYNNE FITZWATER  ([id number], 1966/04/02) on 08/30/21 at  2:30 PM EDT by a video-enabled telemedicine application and verified that I am speaking with the correct person using two identifiers.  Location: Patient: Virtual Visit Location Patient: Home Provider: Virtual Visit Location Provider: Home Office   I discussed the limitations of evaluation and  management by telemedicine and the availability of in person appointments. The patient expressed understanding and agreed to proceed.    History of Present Illness: ARELIZ ROTHMAN is a 55 y.o. who identifies as a female who was assigned female at birth, and is being seen today for ongoing URI symptoms. Was initially seen back on 08/19/2021 at ER at which time flu and COVID PCR were negative. CXR obtained at that time and negative for acute process. Was treated with steroids in ER and sent home with supportive measures and cough medications. Since that time symptoms have continued and worsened now with a lower-grade fever intermittently, further increased sputum production and wheezing despite use of her inhaler. Just found out one of the kids she was around was diagnosed with RSV a few days before her symptom onset.   HPI: HPI  Problems:  Patient Active Problem List   Diagnosis Date Noted   Simple chronic bronchitis ([place]) 12/20/2020   Centrilobular emphysema ([place]) 06/14/2020   Encounter for screening for lung cancer 06/08/2020   Chronic renal disease, stage 2, mildly decreased glomerular filtration rate (GFR) between 60-89 mL/min/1.73 square meter 04/09/2020   Colon cancer screening 10/08/2019   Vitamin D deficiency disease 10/08/2019   B12 nutritional deficiency 04/04/2017   Current severe episode of major depressive disorder without psychotic features without prior episode ([place]) 03/18/2017   Routine general  medical examination at a health care facility 03/12/2016   Tobacco abuse 12/01/2015   Prediabetes 03/03/2013   Visit for screening mammogram 02/13/2013   Spinal stenosis in cervical region 02/13/2013   Hypothyroid 01/18/2011   OBSTRUCTIVE SLEEP APNEA 01/09/2011   Essential hypertension 12/19/2010   Allergic rhinitis 12/19/2010    Allergies:  Allergies  Allergen Reactions   Shellfish Allergy Nausea And Vomiting   Codeine     REACTION: tachycardia   Tramadol     Nausea and  vomiting   Medications:  Current Outpatient Medications:    albuterol (VENTOLIN HFA) 108 (90 Base) MCG/ACT inhaler, Inhale 2 puffs into the lungs every 6 (six) hours as needed for wheezing or shortness of breath., Disp: 8 g, Rfl: 0   EDARBI 80 MG TABS, TAKE 1 TABLET ONCE DAILY., Disp: 30 tablet, Rfl: 5   fluticasone (FLONASE) 50 MCG/ACT nasal spray, Use 2 spray(s) in each nostril once daily, Disp: 48 g, Rfl: 1   Fluticasone-Umeclidin-Vilant (TRELEGY ELLIPTA) 100-62.5-25 MCG/INH AEPB, Inhale 1 puff into the lungs daily., Disp: 120 each, Rfl: 1   folic acid (FOLVITE) 1 MG tablet, Take 1 tablet (1 mg total) by mouth daily., Disp: 90 tablet, Rfl: 1   levocetirizine (XYZAL) 5 MG tablet, Take 1 tablet (5 mg total) by mouth every evening., Disp: 90 tablet, Rfl: 1   levothyroxine (SYNTHROID) 75 MCG tablet, Take 1 tablet by mouth once daily, Disp: 90 tablet, Rfl: 0   triamterene-hydrochlorothiazide (DYAZIDE) 37.5-25 MG capsule, Take 1 capsule by mouth once daily, Disp: 90 capsule, Rfl: 0  Current Facility-Administered Medications:    cyanocobalamin ((VITAMIN B-12)) injection 1,000 mcg, 1,000 mcg, Intramuscular, Q30 days, Janith Lima, MD, 1,000 mcg at 04/19/21 1452  Observations/Objective: Patient is well-developed, well-nourished in no acute distress.  Resting comfortably at home.  Head is normocephalic, atraumatic.  No labored breathing. Audible wheezing with expiration. Speech is clear and coherent with logical content.  Patient is alert and oriented at baseline.   Assessment and Plan: 1. COPD exacerbation ([place]) With concern for acute bronchitis now secondary to initial viral URI. Will start prednisone 40 mg daily x 5 days. Continue albuterol as directed, if needed. Rx Doxycycline to take twice daily for 7 days. Rx promethazine-DM for cough. Supportive measures and OTC medications reviewed. Strict ER precautions reviewed with patient and her wife.  Follow Up Instructions: I discussed the  assessment and treatment plan with the patient. The patient was provided an opportunity to ask questions and all were answered. The patient agreed with the plan and demonstrated an understanding of the instructions.  A copy of instructions were sent to the patient via MyChart unless otherwise noted below.   The patient was advised to call back or seek an in-person evaluation if the symptoms worsen or if the condition fails to improve as anticipated.  Time:  I spent 15 minutes with the patient via telehealth technology discussing the above problems/concerns.    Leeanne Rio, PA-C

## 2021-09-14 ENCOUNTER — Encounter (HOSPITAL_BASED_OUTPATIENT_CLINIC_OR_DEPARTMENT_OTHER): Payer: Self-pay | Admitting: *Deleted

## 2021-09-14 ENCOUNTER — Emergency Department (HOSPITAL_BASED_OUTPATIENT_CLINIC_OR_DEPARTMENT_OTHER)
Admission: EM | Admit: 2021-09-14 | Discharge: 2021-09-15 | Disposition: A | Discharge status: Home / Self Care | Payer: BC Managed Care – PPO | Attending: Emergency Medicine | Admitting: Emergency Medicine

## 2021-09-14 ENCOUNTER — Other Ambulatory Visit: Payer: Self-pay

## 2021-09-14 ENCOUNTER — Emergency Department (HOSPITAL_BASED_OUTPATIENT_CLINIC_OR_DEPARTMENT_OTHER): Payer: BC Managed Care – PPO

## 2021-09-14 DIAGNOSIS — R059 Cough, unspecified: Secondary | ICD-10-CM | POA: Insufficient documentation

## 2021-09-14 DIAGNOSIS — J449 Chronic obstructive pulmonary disease, unspecified: Secondary | ICD-10-CM | POA: Insufficient documentation

## 2021-09-14 DIAGNOSIS — R0602 Shortness of breath: Secondary | ICD-10-CM | POA: Diagnosis not present

## 2021-09-14 DIAGNOSIS — I10 Essential (primary) hypertension: Secondary | ICD-10-CM | POA: Diagnosis not present

## 2021-09-14 DIAGNOSIS — Z87891 Personal history of nicotine dependence: Secondary | ICD-10-CM | POA: Diagnosis not present

## 2021-09-14 LAB — CBC
HCT: 36.6 % (ref 36.0–46.0)
Hemoglobin: 12.4 g/dL (ref 12.0–15.0)
MCH: 29.2 pg (ref 26.0–34.0)
MCHC: 33.9 g/dL (ref 30.0–36.0)
MCV: 86.1 fL (ref 80.0–100.0)
Platelets: 378 10*3/uL (ref 150–400)
RBC: 4.25 MIL/uL (ref 3.87–5.11)
RDW: 13.5 % (ref 11.5–15.5)
WBC: 12.7 10*3/uL — ABNORMAL HIGH (ref 4.0–10.5)
nRBC: 0 % (ref 0.0–0.2)

## 2021-09-14 LAB — D-DIMER, QUANTITATIVE: D-Dimer, Quant: <0.27 ug/mL-FEU (ref 0.00–0.50)

## 2021-09-14 MED ORDER — ALBUTEROL SULFATE HFA 108 (90 BASE) MCG/ACT IN AERS
2.0000 | INHALATION_SPRAY | Freq: Once | RESPIRATORY_TRACT | Status: AC
  Administered 2021-09-14: 23:00:00 2 via RESPIRATORY_TRACT
  Filled 2021-09-14: qty 6.7

## 2021-09-14 NOTE — ED Triage Notes (Addendum)
C/o cough with SOB x 15 days, completed steroid and cough med no improvement

## 2021-09-14 NOTE — ED Provider Notes (Signed)
[facility] Emergency Department Provider Note MRN:  [id number]  Arrival date & time: 09/15/21     Chief Complaint   Cough   History of Present Illness   Sarah Watts is a 55 y.o. year-old female with a history of hypertension, COPD presenting to the ED with chief complaint of cough.  Persistent cough for the past couple weeks.  Was diagnosed with a COPD exacerbation and prescribed steroids and doxycycline.  Completed these medicines about a week ago and was feeling better.  For the past 2 days experiencing increasing cough, productive of thick mucus.  Subjective fever last night.  Denies any chest pain, does feel mildly short of breath.  No headache or vision change, no neck pain, no abdominal pain, no leg pain or swelling, no numbness or weakness to the arms or legs.  Review of Systems  A complete 10 system review of systems was obtained and all systems are negative except as noted in the HPI and PMH.   Patient's Health History    Past Medical History:  Diagnosis Date   Allergic rhinitis    Heart murmur    HTN (hypertension)    Irregular heartbeat     Past Surgical History:  Procedure Laterality Date   APPENDECTOMY     CESAREAN SECTION     CHOLECYSTECTOMY     FACIAL RECONSTRUCTION SURGERY     Left, due to birth defect   TONSILLECTOMY      Family History  Problem Relation Age of Onset   Diabetes Other        1st degree relative   Hypertension Other    Prostate cancer Other        1st degree relative <50   Allergies Mother    Breast cancer Mother    Allergies Father    Heart disease Father    Allergies Sister    Asthma Sister    Colon cancer Neg Hx     Social History   Socioeconomic History   Marital status: Divorced    Spouse name: Not on file   Number of children: Y   Years of education: Not on file   Highest education level: Not on file  Occupational History   Occupation: Therapist, music: [id number]  Tobacco Use    Smoking status: Former    Packs/day: 0.50    Years: 15.00    Pack years: 7.50    Types: Cigarettes    Quit date: 06/07/2020    Years since quitting: 1.2   Smokeless tobacco: Never  Vaping Use   Vaping Use: Former  Substance and Sexual Activity   Alcohol use: No   Drug use: No   Sexual activity: Yes    Comment: female partner  Other Topics Concern   Not on file  Social History Narrative   Domestic Partner - Female, Estate manager/land agent   Regular Exercise -  NO   Social Determinants of Health   Financial Resource Strain: Not on file  Food Insecurity: Not on file  Transportation Needs: Not on file  Physical Activity: Not on file  Stress: Not on file  Social Connections: Not on file  Intimate Partner Violence: Not on file     Physical Exam   Vitals:   09/14/21 2345 09/15/21 0000  BP: 128/62 132/66  Pulse: 76 76  Resp: 17 19  Temp:    SpO2: 92% 94%    CONSTITUTIONAL: Well-appearing, NAD NEURO:  Alert and oriented x  3, no focal deficits EYES:  eyes equal and reactive ENT/NECK:  no LAD, no JVD CARDIO: Regular rate, well-perfused, normal S1 and S2 PULM:  CTAB no wheezing or rhonchi GI/GU:  normal bowel sounds, non-distended, non-tender MSK/SPINE:  No gross deformities, no edema SKIN:  no rash, atraumatic PSYCH:  Appropriate speech and behavior  *Additional and/or pertinent findings included in MDM below  Diagnostic and Interventional Summary    EKG Interpretation  Date/Time:  Thursday September 14 2021 23:16:13 EST Ventricular Rate:  77 PR Interval:  169 QRS Duration: 135 QT Interval:  435 QTC Calculation: 493 R Axis:   8 Text Interpretation: Sinus rhythm Right bundle branch block Confirmed by Gerlene Fee [phone]) on 09/14/2021 11:20:02 PM       Labs Reviewed  CBC - Abnormal; Notable for the following components:      Result Value   WBC 12.7 (*)    All other components within normal limits  BASIC METABOLIC PANEL - Abnormal; Notable for the following components:    Potassium 2.7 (*)    Glucose, Bld 163 (*)    Creatinine, Ser 1.06 (*)    All other components within normal limits  BRAIN NATRIURETIC PEPTIDE  D-DIMER, QUANTITATIVE  TROPONIN I (HIGH SENSITIVITY)    DG Chest Port 1 View  Final Result      Medications  albuterol (VENTOLIN HFA) 108 (90 Base) MCG/ACT inhaler 2 puff (2 puffs Inhalation Given 09/14/21 2321)  potassium chloride SA (KLOR-CON) CR tablet 40 mEq (40 mEq Oral Given 09/15/21 0009)     Procedures  /  Critical Care Procedures  ED Course and Medical Decision Making  I have reviewed the triage vital signs, the nursing notes, and pertinent available records from the EMR.  Listed above are laboratory and imaging tests that I personally ordered, reviewed, and interpreted and then considered in my medical decision making (see below for details).  Return of shortness of breath, productive cough, subjective fever.  Differential diagnosis including COPD exacerbation, pneumonia, viral illness.  Has had recent exposure to a child with RSV.  Patient is sitting comfortably in no acute distress, vital signs are normal.  Lungs are overall clear with no significant wheezing.  No increased work of breathing, no leg pain or swelling.  Overall doubt PE or other emergent process.  Patient is endorsing some left-sided rib pain and thoracic back pain.  Screening EKG showing a new right bundle branch block compared to remote EKG on record back in 2002.  Will obtain screening labs with troponin and D-dimer to exclude ACS, PE     Labs reassuring, hypokalemia noted and repleted, patient is appropriate for discharge.  Barth Kirks. Sedonia Small, [place] [email or web address]  Final Clinical Impressions(s) / ED Diagnoses     ICD-10-CM   1. Cough, unspecified type  R05.9       ED Discharge Orders          Ordered    cefdinir (OMNICEF) 300 MG capsule  2 times daily        09/15/21 0015    azithromycin  (ZITHROMAX) 250 MG tablet  Daily        09/15/21 0015    potassium chloride SA (KLOR-CON) 20 MEQ tablet  2 times daily        09/15/21 0015             Discharge Instructions Discussed with and Provided to Patient:     Discharge Instructions  You were evaluated in the Emergency Department and after careful evaluation, we did not find any emergent condition requiring admission or further testing in the hospital.  Your exam/testing today was overall reassuring.  We are treating you for possible pneumonia.  Take the Omnicef and azithromycin antibiotics as directed.  It is also important that you take the potassium pills as directed and increase the amount of potassium in your diet.  Please return to the Emergency Department if you experience any worsening of your condition.  Thank you for allowing [place] to be a part of your care.         Maudie Flakes, MD 09/15/21 [phone]

## 2021-09-15 LAB — BRAIN NATRIURETIC PEPTIDE: B Natriuretic Peptide: 31.1 pg/mL (ref 0.0–100.0)

## 2021-09-15 LAB — BASIC METABOLIC PANEL
Anion gap: 10 (ref 5–15)
BUN: 14 mg/dL (ref 6–20)
CO2: 27 mmol/L (ref 22–32)
Calcium: 9.2 mg/dL (ref 8.9–10.3)
Chloride: 99 mmol/L (ref 98–111)
Creatinine, Ser: 1.06 mg/dL — ABNORMAL HIGH (ref 0.44–1.00)
GFR, Estimated: >60 mL/min (ref >60)
Glucose, Bld: 163 mg/dL — ABNORMAL HIGH (ref 70–99)
Potassium: 2.7 mmol/L — CL (ref 3.5–5.1)
Sodium: 136 mmol/L (ref 135–145)

## 2021-09-15 LAB — TROPONIN I (HIGH SENSITIVITY): Troponin I (High Sensitivity): 9 ng/L (ref <18)

## 2021-09-15 MED ORDER — CEFDINIR 300 MG PO CAPS: 300.0000 mg | ORAL_CAPSULE | Freq: Two times a day (BID) | ORAL | 0 refills | Status: AC

## 2021-09-15 MED ORDER — POTASSIUM CHLORIDE CRYS ER 20 MEQ PO TBCR
40.0000 meq | EXTENDED_RELEASE_TABLET | Freq: Once | ORAL | Status: AC
  Administered 2021-09-15: 40 meq via ORAL
  Filled 2021-09-15: qty 2

## 2021-09-15 MED ORDER — POTASSIUM CHLORIDE CRYS ER 20 MEQ PO TBCR: 20.0000 meq | EXTENDED_RELEASE_TABLET | Freq: Two times a day (BID) | ORAL | 0 refills | Status: DC

## 2021-09-15 MED ORDER — AZITHROMYCIN 250 MG PO TABS: 250.0000 mg | ORAL_TABLET | Freq: Every day | ORAL | 0 refills | Status: DC

## 2021-09-15 NOTE — ED Notes (Signed)
Patient discharged to home.  All discharge instructions reviewed.  Patient verbalized understanding via teachback method.  VS WDL.  Respirations even and unlabored.  Ambulatory out of ED.   °

## 2021-09-15 NOTE — ED Notes (Signed)
Pt called and reported pharmacy never received azithromycin prescription from discharge on 09/14/21. Verified prescription per AVS summary and contacted pharmacy with provider name, px, dose, route. Patient contacted and aware missing prescription called into pharmacy.

## 2021-09-15 NOTE — Discharge Instructions (Signed)
You were evaluated in the Emergency Department and after careful evaluation, we did not find any emergent condition requiring admission or further testing in the hospital.  Your exam/testing today was overall reassuring.  We are treating you for possible pneumonia.  Take the Omnicef and azithromycin antibiotics as directed.  It is also important that you take the potassium pills as directed and increase the amount of potassium in your diet.  Please return to the Emergency Department if you experience any worsening of your condition.  Thank you for allowing [place] to be a part of your care.

## 2021-09-25 NOTE — Progress Notes (Signed)
Subjective:    Patient ID: Sarah Watts, female    DOB: January 20, 1966, 55 y.o.   MRN: [id number]  This visit occurred during the SARS-CoV-2 public health emergency.  Safety protocols were in place, including screening questions prior to the visit, additional usage of staff PPE, and extensive cleaning of exam room while observing appropriate contact time as indicated for disinfecting solutions.    HPI The patient is here for an acute visit for persisting cough.   08/19/21 - ED - cough, sob. Cxr neg, covid/flu neg.  Rx'd prednisone 40 mg daily, albuterol  09/14/21 - ED - cough - cxr neg, labs neg, but K low, prescribed a zpak, cefdinir, potassium  Does not use trelegy or breztri daily - bc of cost. she uses albuterol multiple times a day and it helps.  She felt both prescription medications she received in the recent past helped, but symptoms got worse both times after finishing them.    She was exposed to RSV about three weeks ago. She feels like the issue is in her bronchi.  She coughs a lot at night and she brings up yellow sputum.  She is more short of breath than usual and having some wheezing and chest tightness.   Medications and allergies reviewed with patient and updated if appropriate.  Patient Active Problem List   Diagnosis Date Noted   Simple chronic bronchitis ([place]) 12/20/2020   Centrilobular emphysema ([place]) 06/14/2020   Encounter for screening for lung cancer 06/08/2020   Chronic renal disease, stage 2, mildly decreased glomerular filtration rate (GFR) between 60-89 mL/min/1.73 square meter 04/09/2020   Colon cancer screening 10/08/2019   Vitamin D deficiency disease 10/08/2019   B12 nutritional deficiency 04/04/2017   Current severe episode of major depressive disorder without psychotic features without prior episode ([place]) 03/18/2017   Routine general medical examination at a health care facility 03/12/2016   Tobacco abuse 12/01/2015   Prediabetes 03/03/2013    Visit for screening mammogram 02/13/2013   Spinal stenosis in cervical region 02/13/2013   Hypothyroid 01/18/2011   OBSTRUCTIVE SLEEP APNEA 01/09/2011   Essential hypertension 12/19/2010   Allergic rhinitis 12/19/2010    Current Outpatient Medications on File Prior to Visit  Medication Sig Dispense Refill   albuterol (VENTOLIN HFA) 108 (90 Base) MCG/ACT inhaler Inhale 2 puffs into the lungs every 6 (six) hours as needed for wheezing or shortness of breath. 8 g 0   EDARBI 80 MG TABS TAKE 1 TABLET ONCE DAILY. 30 tablet 5   fluticasone (FLONASE) 50 MCG/ACT nasal spray Use 2 spray(s) in each nostril once daily 48 g 1   Fluticasone-Umeclidin-Vilant (TRELEGY ELLIPTA) 100-62.5-25 MCG/INH AEPB Inhale 1 puff into the lungs daily. 202 each 1   folic acid (FOLVITE) 1 MG tablet Take 1 tablet (1 mg total) by mouth daily. 90 tablet 1   levocetirizine (XYZAL) 5 MG tablet Take 1 tablet (5 mg total) by mouth every evening. 90 tablet 1   levothyroxine (SYNTHROID) 75 MCG tablet Take 1 tablet by mouth once daily 90 tablet 0   triamterene-hydrochlorothiazide (DYAZIDE) 37.5-25 MG capsule Take 1 capsule by mouth once daily 90 capsule 0   Current Facility-Administered Medications on File Prior to Visit  Medication Dose Route Frequency Provider Last Rate Last Admin   cyanocobalamin ((VITAMIN B-12)) injection 1,000 mcg  1,000 mcg Intramuscular Q30 days Janith Lima, MD   1,000 mcg at 04/19/21 1452    Past Medical History:  Diagnosis Date   Allergic rhinitis  Heart murmur    HTN (hypertension)    Irregular heartbeat     Past Surgical History:  Procedure Laterality Date   APPENDECTOMY     CESAREAN SECTION     CHOLECYSTECTOMY     FACIAL RECONSTRUCTION SURGERY     Left, due to birth defect   TONSILLECTOMY      Social History   Socioeconomic History   Marital status: Divorced    Spouse name: Not on file   Number of children: Y   Years of education: Not on file   Highest education level: Not on  file  Occupational History   Occupation: Therapist, music: [employer]  Tobacco Use   Smoking status: Former    Packs/day: 0.50    Years: 15.00    Pack years: 7.50    Types: Cigarettes    Quit date: 06/07/2020    Years since quitting: 1.3   Smokeless tobacco: Never  Vaping Use   Vaping Use: Former  Substance and Sexual Activity   Alcohol use: No   Drug use: No   Sexual activity: Yes    Comment: female partner  Other Topics Concern   Not on file  Social History Narrative   Domestic Partner - Female, Estate manager/land agent   Regular Exercise -  NO   Social Determinants of Health   Financial Resource Strain: Not on file  Food Insecurity: Not on file  Transportation Needs: Not on file  Physical Activity: Not on file  Stress: Not on file  Social Connections: Not on file    Family History  Problem Relation Age of Onset   Diabetes Other        1st degree relative   Hypertension Other    Prostate cancer Other        1st degree relative <50   Allergies Mother    Breast cancer Mother    Allergies Father    Heart disease Father    Allergies Sister    Asthma Sister    Colon cancer Neg Hx     Review of Systems  Constitutional:  Positive for chills and diaphoresis. Negative for fever.  HENT:  Positive for congestion, sinus pressure, sore throat and voice change. Negative for ear pain.   Respiratory:  Positive for cough (productive of yellow sputum), chest tightness, shortness of breath and wheezing.   Gastrointestinal:  Positive for diarrhea. Negative for nausea.  Musculoskeletal:  Negative for myalgias.  Neurological:  Positive for dizziness (couple of times) and headaches (posterior  - neck).      Objective:   Vitals:   09/26/21 1012 09/26/21 1106  BP: (!) 170/100 (!) 160/80  Pulse: 80   Temp: 98.5 F (36.9 C)   SpO2: 96%    BP Readings from Last 3 Encounters:  09/26/21 (!) 160/80  09/15/21 132/66  08/19/21 (!) 153/84   Wt Readings from Last 3 Encounters:   09/26/21 171 lb (77.6 kg)  09/14/21 167 lb (75.8 kg)  08/19/21 165 lb (74.8 kg)   Body mass index is 33.4 kg/m.   Physical Exam    GENERAL APPEARANCE: Appears stated age, well appearing, NAD EYES: conjunctiva clear, no icterus HENT: bilateral tympanic membranes and ear canals normal, oropharynx with no erythema or exudates, trachea midline, no cervical or supraclavicular lymphadenopathy LUNGS: Unlabored breathing, good air entry bilaterally, no crackles, diffuse expiratory wheeze  CARDIOVASCULAR: Normal S1,S2 , no edema SKIN: Warm, dry      Assessment & Plan:    Bronchitis,  COPD exacerbation: Acute visit She is acute on chronic bronchitis with COPD exacerbation, and given duration and persistent symptoms concern for possible bacterial cause in addition to the COPD exacerbation Tested twice for COVID/flu and both negative Depo-Medrol 80 mg IM x1 Start doxycycline 100 mg twice daily x10 days Prednisone 40 mg daily starting tomorrow for 5 days Continue albuterol as needed Samples given a dose maintenance inhalers-Trelegy and Breztri-savings card given for both as well to see which 1 will work for her  Follow-up if symptoms not improving or any concerns

## 2021-09-26 ENCOUNTER — Other Ambulatory Visit: Payer: Self-pay

## 2021-09-26 ENCOUNTER — Encounter: Payer: Self-pay | Admitting: Internal Medicine

## 2021-09-26 ENCOUNTER — Ambulatory Visit (INDEPENDENT_AMBULATORY_CARE_PROVIDER_SITE_OTHER): Payer: BC Managed Care – PPO | Admitting: Internal Medicine

## 2021-09-26 VITALS — BP 160/80 | HR 80 | Temp 98.5°F | Ht 60.0 in | Wt 171.0 lb

## 2021-09-26 DIAGNOSIS — R053 Chronic cough: Secondary | ICD-10-CM | POA: Diagnosis not present

## 2021-09-26 DIAGNOSIS — J40 Bronchitis, not specified as acute or chronic: Secondary | ICD-10-CM

## 2021-09-26 DIAGNOSIS — J441 Chronic obstructive pulmonary disease with (acute) exacerbation: Secondary | ICD-10-CM | POA: Diagnosis not present

## 2021-09-26 MED ORDER — DOXYCYCLINE HYCLATE 100 MG PO TABS: 100.0000 mg | ORAL_TABLET | Freq: Two times a day (BID) | ORAL | 0 refills | Status: AC

## 2021-09-26 MED ORDER — BREZTRI AEROSPHERE 160-9-4.8 MCG/ACT IN AERO: 2.0000 | INHALATION_SPRAY | Freq: Two times a day (BID) | RESPIRATORY_TRACT | 11 refills | Status: DC

## 2021-09-26 MED ORDER — METHYLPREDNISOLONE ACETATE 80 MG/ML IJ SUSP
80.0000 mg | Freq: Once | INTRAMUSCULAR | Status: AC
  Administered 2021-09-26: 80 mg via INTRAMUSCULAR

## 2021-09-26 MED ORDER — PREDNISONE 20 MG PO TABS: 40.0000 mg | ORAL_TABLET | Freq: Every day | ORAL | 0 refills | Status: AC

## 2021-09-26 NOTE — Patient Instructions (Addendum)
     You received a steroid injection here today.    Medications changes include :   doxycycline - antibiotic. Prednisone starting tomorrow.     Your prescription(s) have been submitted to your pharmacy. Please take as directed and contact our office if you believe you are having problem(s) with the medication(s).

## 2021-09-27 ENCOUNTER — Other Ambulatory Visit: Payer: Self-pay | Admitting: Internal Medicine

## 2021-09-27 DIAGNOSIS — I10 Essential (primary) hypertension: Secondary | ICD-10-CM

## 2021-11-04 ENCOUNTER — Other Ambulatory Visit: Payer: Self-pay | Admitting: Internal Medicine

## 2021-11-04 DIAGNOSIS — I10 Essential (primary) hypertension: Secondary | ICD-10-CM

## 2021-11-04 DIAGNOSIS — E039 Hypothyroidism, unspecified: Secondary | ICD-10-CM

## 2021-11-04 DIAGNOSIS — E876 Hypokalemia: Secondary | ICD-10-CM

## 2021-11-22 ENCOUNTER — Other Ambulatory Visit: Payer: Self-pay | Admitting: Internal Medicine

## 2021-11-22 DIAGNOSIS — I10 Essential (primary) hypertension: Secondary | ICD-10-CM

## 2021-11-22 DIAGNOSIS — E876 Hypokalemia: Secondary | ICD-10-CM

## 2021-11-22 DIAGNOSIS — E039 Hypothyroidism, unspecified: Secondary | ICD-10-CM

## 2021-11-24 ENCOUNTER — Other Ambulatory Visit: Payer: Self-pay | Admitting: Internal Medicine

## 2021-11-24 DIAGNOSIS — E039 Hypothyroidism, unspecified: Secondary | ICD-10-CM

## 2021-11-24 DIAGNOSIS — E876 Hypokalemia: Secondary | ICD-10-CM

## 2021-11-24 DIAGNOSIS — I10 Essential (primary) hypertension: Secondary | ICD-10-CM

## 2021-11-25 ENCOUNTER — Other Ambulatory Visit: Payer: Self-pay | Admitting: Internal Medicine

## 2021-11-25 DIAGNOSIS — E039 Hypothyroidism, unspecified: Secondary | ICD-10-CM

## 2021-11-25 DIAGNOSIS — E876 Hypokalemia: Secondary | ICD-10-CM

## 2021-11-25 DIAGNOSIS — I10 Essential (primary) hypertension: Secondary | ICD-10-CM

## 2021-11-25 MED ORDER — TRIAMTERENE-HCTZ 37.5-25 MG PO CAPS: 1.0000 | ORAL_CAPSULE | Freq: Every day | ORAL | 0 refills | Status: DC

## 2021-11-25 MED ORDER — LEVOTHYROXINE SODIUM 75 MCG PO TABS: 75.0000 ug | ORAL_TABLET | Freq: Every day | ORAL | 0 refills | Status: DC

## 2021-12-26 NOTE — Progress Notes (Unsigned)
° ° °  Subjective:    Patient ID: Sarah Watts, female    DOB: 02-Nov-1965, 56 y.o.   MRN: [id number]  This visit occurred during the SARS-CoV-2 public health emergency.  Safety protocols were in place, including screening questions prior to the visit, additional usage of staff PPE, and extensive cleaning of exam room while observing appropriate contact time as indicated for disinfecting solutions.    HPI Nashika is here for  Chief Complaint  Patient presents with   COPD    Flare of COPD         Medications and allergies reviewed with patient and updated if appropriate.  Current Outpatient Medications on File Prior to Visit  Medication Sig Dispense Refill   albuterol (VENTOLIN HFA) 108 (90 Base) MCG/ACT inhaler Inhale 2 puffs into the lungs every 6 (six) hours as needed for wheezing or shortness of breath. 8 g 0   Budeson-Glycopyrrol-Formoterol (BREZTRI AEROSPHERE) 160-9-4.8 MCG/ACT AERO Inhale 2 puffs into the lungs 2 (two) times daily. 10.7 g 11   EDARBI 80 MG TABS TAKE 1 TABLET ONCE DAILY. 30 tablet 5   fluticasone (FLONASE) 50 MCG/ACT nasal spray Use 2 spray(s) in each nostril once daily 48 g 1   folic acid (FOLVITE) 1 MG tablet Take 1 tablet (1 mg total) by mouth daily. 90 tablet 1   levocetirizine (XYZAL) 5 MG tablet Take 1 tablet (5 mg total) by mouth every evening. 90 tablet 1   levothyroxine (SYNTHROID) 75 MCG tablet Take 1 tablet (75 mcg total) by mouth daily. 30 tablet 0   triamterene-hydrochlorothiazide (DYAZIDE) 37.5-25 MG capsule Take 1 each (1 capsule total) by mouth daily. 30 capsule 0   No current facility-administered medications on file prior to visit.    Review of Systems     Objective:  There were no vitals filed for this visit. BP Readings from Last 3 Encounters:  09/26/21 (!) 160/80  09/15/21 132/66  08/19/21 (!) 153/84   Wt Readings from Last 3 Encounters:  09/26/21 171 lb (77.6 kg)  09/14/21 167 lb (75.8 kg)  08/19/21 165 lb (74.8 kg)    There is no height or weight on file to calculate BMI.    Physical Exam         Assessment & Plan:    See Problem List for Assessment and Plan of chronic medical problems.

## 2021-12-27 ENCOUNTER — Encounter: Payer: BC Managed Care – PPO | Admitting: Internal Medicine

## 2022-01-05 ENCOUNTER — Other Ambulatory Visit: Payer: Self-pay | Admitting: Internal Medicine

## 2022-01-05 DIAGNOSIS — E876 Hypokalemia: Secondary | ICD-10-CM

## 2022-01-05 DIAGNOSIS — I10 Essential (primary) hypertension: Secondary | ICD-10-CM

## 2022-01-05 DIAGNOSIS — E039 Hypothyroidism, unspecified: Secondary | ICD-10-CM

## 2022-01-07 ENCOUNTER — Encounter: Payer: Self-pay | Admitting: Internal Medicine

## 2022-01-07 NOTE — Progress Notes (Signed)
? ? ? ? ?Subjective:  ? ? Patient ID: Sarah Watts, female    DOB: 02/16/1966, 56 y.o.   MRN: [id number] ? ?This visit occurred during the SARS-CoV-2 public health emergency.  Safety protocols were in place, including screening questions prior to the visit, additional usage of staff PPE, and extensive cleaning of exam room while observing appropriate contact time as indicated for disinfecting solutions.   ? ? ?HPI ?Aliannah is here for follow up of her chronic medical problems, including htn, hypothyroid, prediabetes ? ?She has been out of her medications for about one week.  ? ?Persistent cough - it started a while - 2-3 weeks ago.  She thinks she has a sinus infection. ? ?Medications and allergies reviewed with patient and updated if appropriate. ? ?Current Outpatient Medications on File Prior to Visit  ?Medication Sig Dispense Refill  ? albuterol (VENTOLIN HFA) 108 (90 Base) MCG/ACT inhaler Inhale 2 puffs into the lungs every 6 (six) hours as needed for wheezing or shortness of breath. 8 g 0  ? Budeson-Glycopyrrol-Formoterol (BREZTRI AEROSPHERE) 160-9-4.8 MCG/ACT AERO Inhale 2 puffs into the lungs 2 (two) times daily. 10.7 g 11  ? EDARBI 80 MG TABS TAKE 1 TABLET ONCE DAILY. 30 tablet 5  ? fluticasone (FLONASE) 50 MCG/ACT nasal spray Use 2 spray(s) in each nostril once daily 48 g 1  ? folic acid (FOLVITE) 1 MG tablet Take 1 tablet (1 mg total) by mouth daily. 90 tablet 1  ? levocetirizine (XYZAL) 5 MG tablet Take 1 tablet (5 mg total) by mouth every evening. 90 tablet 1  ? levothyroxine (SYNTHROID) 75 MCG tablet Take 1 tablet (75 mcg total) by mouth daily. 30 tablet 0  ? triamterene-hydrochlorothiazide (DYAZIDE) 37.5-25 MG capsule Take 1 each (1 capsule total) by mouth daily. 30 capsule 0  ? ?No current facility-administered medications on file prior to visit.  ? ? ? ?Review of Systems  ?Constitutional:  Positive for chills (initially) and fatigue. Negative for fever.  ?HENT:  Positive for congestion, ear pain,  sinus pain and sore throat (intermittent).   ?Respiratory:  Positive for cough, shortness of breath (chronic) and wheezing.   ?Cardiovascular:  Negative for chest pain and palpitations.  ?Neurological:  Positive for dizziness and headaches.  ? ?   ?Objective:  ? ?Vitals:  ? 01/08/22 1508  ?BP: (!) 150/78  ?Pulse: 63  ?Temp: 98.1 ?F (36.7 ?C)  ?SpO2: 97%  ? ?BP Readings from Last 3 Encounters:  ?01/08/22 (!) 150/78  ?09/26/21 (!) 160/80  ?09/15/21 132/66  ? ?Wt Readings from Last 3 Encounters:  ?01/08/22 180 lb (81.6 kg)  ?09/26/21 171 lb (77.6 kg)  ?09/14/21 167 lb (75.8 kg)  ? ?Body mass index is 35.15 kg/m?. ? ?  ?Physical Exam ?Constitutional:   ?   General: She is not in acute distress. ?   Appearance: Normal appearance. She is not ill-appearing.  ?HENT:  ?   Head: Normocephalic and atraumatic.  ?   Right Ear: Tympanic membrane, ear canal and external ear normal.  ?   Left Ear: Tympanic membrane, ear canal and external ear normal.  ?   Mouth/Throat:  ?   Mouth: Mucous membranes are moist.  ?   Pharynx: No oropharyngeal exudate or posterior oropharyngeal erythema.  ?Eyes:  ?   Conjunctiva/sclera: Conjunctivae normal.  ?Cardiovascular:  ?   Rate and Rhythm: Normal rate and regular rhythm.  ?Pulmonary:  ?   Effort: Pulmonary effort is normal. No respiratory distress.  ?  Breath sounds: Normal breath sounds. No wheezing or rales.  ?Musculoskeletal:  ?   Cervical back: Neck supple. No tenderness.  ?   Right lower leg: No edema.  ?   Left lower leg: No edema.  ?Lymphadenopathy:  ?   Cervical: No cervical adenopathy.  ?Skin: ?   General: Skin is warm and dry.  ?Neurological:  ?   Mental Status: She is alert.  ? ?   ? ?Lab Results  ?Component Value Date  ? WBC 12.7 (H) 09/14/2021  ? HGB 12.4 09/14/2021  ? HCT 36.6 09/14/2021  ? PLT 378 09/14/2021  ? GLUCOSE 163 (H) 09/14/2021  ? CHOL 165 03/09/2021  ? TRIG 173.0 (H) 03/09/2021  ? HDL 37.50 (L) 03/09/2021  ? LDLDIRECT 82.0 08/28/2018  ? [place] 93 03/09/2021  ? ALT 14  03/09/2021  ? AST 14 03/09/2021  ? NA 136 09/14/2021  ? K 2.7 (LL) 09/14/2021  ? CL 99 09/14/2021  ? CREATININE 1.06 (H) 09/14/2021  ? BUN 14 09/14/2021  ? CO2 27 09/14/2021  ? TSH 0.37 03/09/2021  ? HGBA1C 6.4 03/09/2021  ? ? ? ?Assessment & Plan:  ? ? ?Hypertension: ?Chronic ?BP not controlled - she has been out of her medication for one week ?Restart dyazide 37.5-25 mg daily ?Cmp ? ?Hypothyroidism: ?Chronic  ?Clinically euthyroid ?Has been out of medication for one week ?Restart levothyroxine 75 mcg daily ?Check tsh  ?Titrate med dose if needed ? ?Prediabetes: ?Chronic ?Check a1c ? ?Acute sinus infection: ?Acute ?Likely bacterial  ?Lungs are clear - using inhaler once a day ?Start Cefdinir 300 mg BID x 10 day ?otc cold medications ?Rest, fluid ? ? ? ? ? ? ?

## 2022-01-08 ENCOUNTER — Ambulatory Visit (INDEPENDENT_AMBULATORY_CARE_PROVIDER_SITE_OTHER): Payer: BC Managed Care – PPO | Admitting: Internal Medicine

## 2022-01-08 ENCOUNTER — Other Ambulatory Visit: Payer: Self-pay

## 2022-01-08 VITALS — BP 150/78 | HR 63 | Temp 98.1°F | Ht 60.0 in | Wt 180.0 lb

## 2022-01-08 DIAGNOSIS — R7303 Prediabetes: Secondary | ICD-10-CM | POA: Diagnosis not present

## 2022-01-08 DIAGNOSIS — E039 Hypothyroidism, unspecified: Secondary | ICD-10-CM | POA: Diagnosis not present

## 2022-01-08 DIAGNOSIS — J432 Centrilobular emphysema: Secondary | ICD-10-CM

## 2022-01-08 DIAGNOSIS — J0100 Acute maxillary sinusitis, unspecified: Secondary | ICD-10-CM | POA: Diagnosis not present

## 2022-01-08 DIAGNOSIS — I10 Essential (primary) hypertension: Secondary | ICD-10-CM

## 2022-01-08 LAB — CBC WITH DIFFERENTIAL/PLATELET
Basophils Absolute: 0.1 10*3/uL (ref 0.0–0.1)
Basophils Relative: 1.1 % (ref 0.0–3.0)
Eosinophils Absolute: 0.5 10*3/uL (ref 0.0–0.7)
Eosinophils Relative: 6 % — ABNORMAL HIGH (ref 0.0–5.0)
HCT: 40.7 % (ref 36.0–46.0)
Hemoglobin: 13.4 g/dL (ref 12.0–15.0)
Lymphocytes Relative: 29.3 % (ref 12.0–46.0)
Lymphs Abs: 2.3 10*3/uL (ref 0.7–4.0)
MCHC: 33.1 g/dL (ref 30.0–36.0)
MCV: 86.8 fl (ref 78.0–100.0)
Monocytes Absolute: 0.5 10*3/uL (ref 0.1–1.0)
Monocytes Relative: 6.8 % (ref 3.0–12.0)
Neutro Abs: 4.5 10*3/uL (ref 1.4–7.7)
Neutrophils Relative %: 56.8 % (ref 43.0–77.0)
Platelets: 376 10*3/uL (ref 150.0–400.0)
RBC: 4.68 Mil/uL (ref 3.87–5.11)
RDW: 13.6 % (ref 11.5–15.5)
WBC: 7.9 10*3/uL (ref 4.0–10.5)

## 2022-01-08 LAB — HEMOGLOBIN A1C: Hgb A1c MFr Bld: 6.2 % (ref 4.6–6.5)

## 2022-01-08 MED ORDER — LEVOTHYROXINE SODIUM 75 MCG PO TABS: 75.0000 ug | ORAL_TABLET | Freq: Every day | ORAL | 1 refills | Status: DC

## 2022-01-08 MED ORDER — TRIAMTERENE-HCTZ 37.5-25 MG PO CAPS: 1.0000 | ORAL_CAPSULE | Freq: Every day | ORAL | 1 refills | Status: DC

## 2022-01-08 MED ORDER — CEFDINIR 300 MG PO CAPS: 300.0000 mg | ORAL_CAPSULE | Freq: Two times a day (BID) | ORAL | 0 refills | Status: DC

## 2022-01-08 NOTE — Assessment & Plan Note (Deleted)
Chronic ?BP not controlled - she has been out of her medication for one week ?Restart dyazide 37.5-25 mg daily ?cmp ? ?

## 2022-01-08 NOTE — Patient Instructions (Addendum)
? ? ? ?  Blood work was ordered.   ? ? ?Medications changes include :   cefdinir twice daily x 10 days.   ? ? ?Your prescription(s) have been sent to your pharmacy.  ? ? ? ?Return in about 3 months (around 04/10/2022) for follow up with Dr Ronnald Ramp. ? ?

## 2022-01-09 LAB — COMPREHENSIVE METABOLIC PANEL
ALT: 16 U/L (ref 0–35)
AST: 16 U/L (ref 0–37)
Albumin: 4.3 g/dL (ref 3.5–5.2)
Alkaline Phosphatase: 66 U/L (ref 39–117)
BUN: 13 mg/dL (ref 6–23)
CO2: 28 mEq/L (ref 19–32)
Calcium: 10.1 mg/dL (ref 8.4–10.5)
Chloride: 103 mEq/L (ref 96–112)
Creatinine, Ser: 0.88 mg/dL (ref 0.40–1.20)
GFR: 73.23 mL/min (ref >60.00)
Glucose, Bld: 101 mg/dL — ABNORMAL HIGH (ref 70–99)
Potassium: 3.7 mEq/L (ref 3.5–5.1)
Sodium: 139 mEq/L (ref 135–145)
Total Bilirubin: 0.4 mg/dL (ref 0.2–1.2)
Total Protein: 7 g/dL (ref 6.0–8.3)

## 2022-01-09 LAB — TSH: TSH: 6.2 u[IU]/mL — ABNORMAL HIGH (ref 0.35–5.50)

## 2022-01-14 NOTE — Patient Instructions (Signed)
     Blood work was ordered.     Medications changes include :   none   Your prescription(s) have been sent to your pharmacy.    A referral was ordered for [facility] for a colonoscopy.     Someone from that office will call you to schedule an appointment.    Return in about 6 months (around 11/29/2022) for Physical Exam.   [facility] Phone: [phone]  

## 2022-04-01 IMAGING — DX DG CHEST 1V PORT
1 series · 1 of 1 positions shown · non-contrast
Comparison: August 19, 2021

CLINICAL DATA: Cough and shortness of breath.

EXAM:
PORTABLE CHEST 1 VIEW

[chest ap]
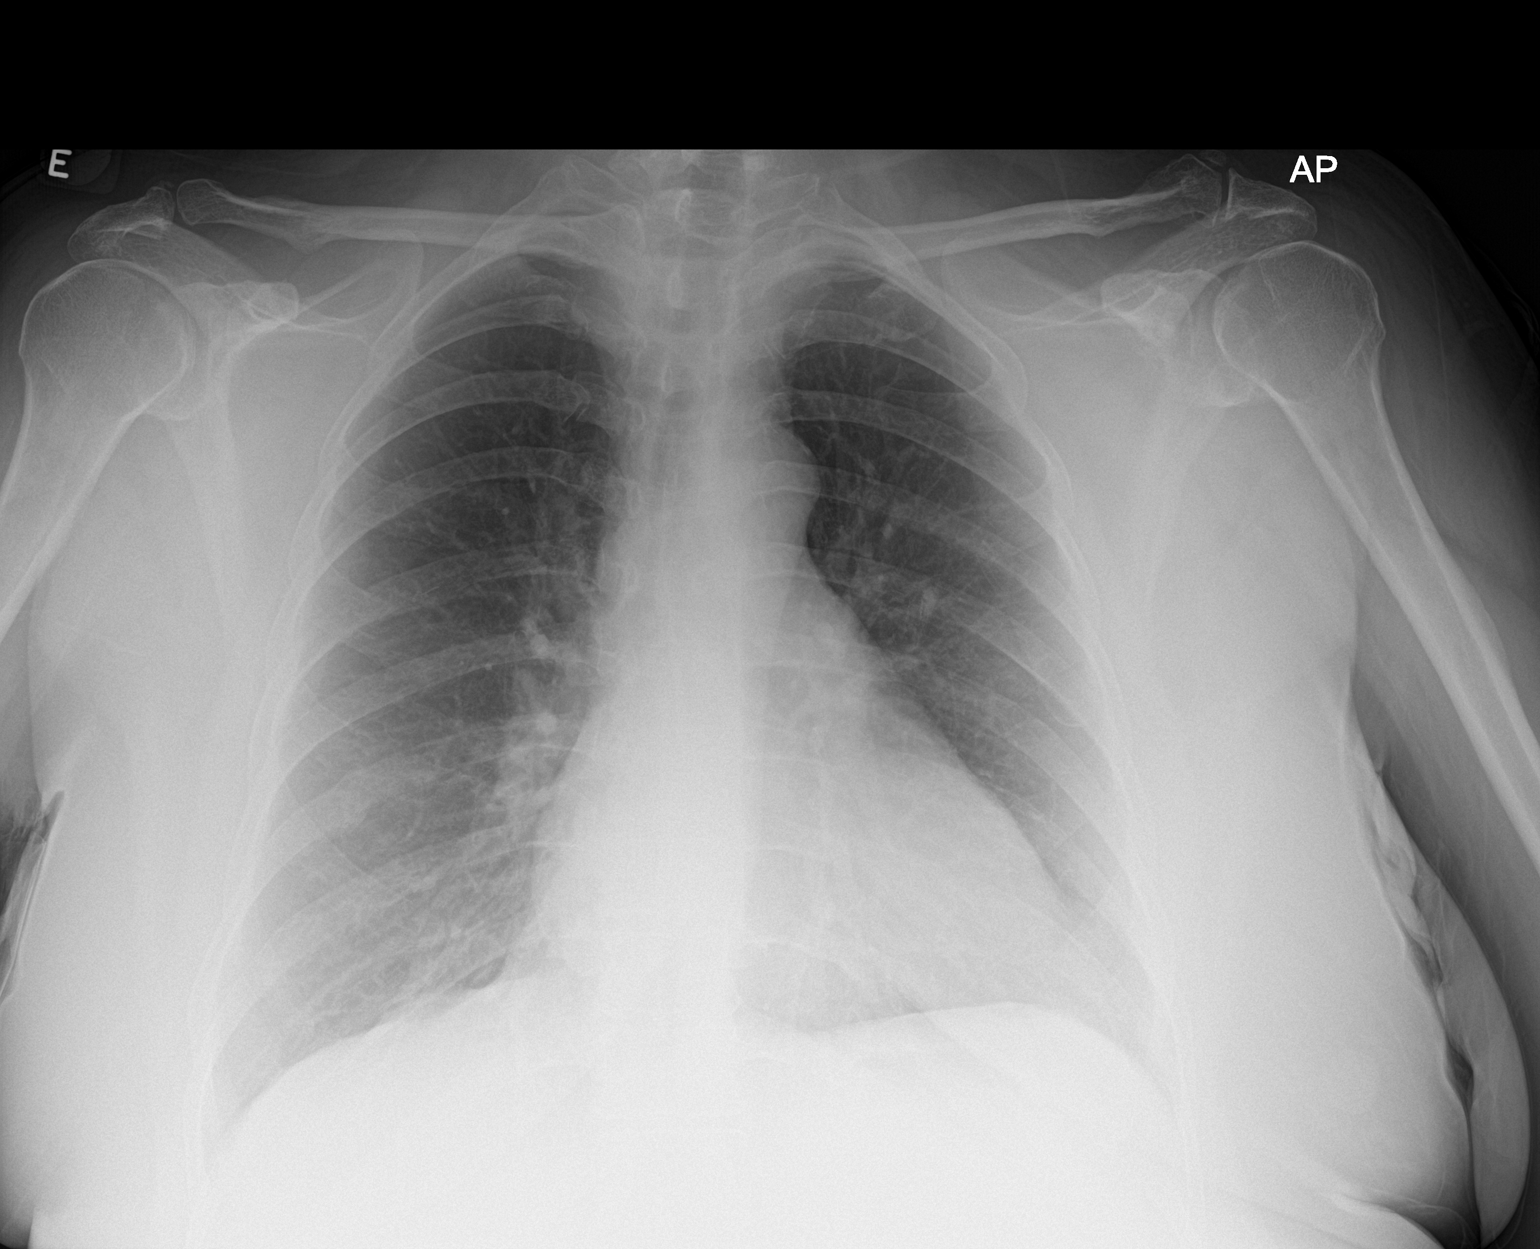

[1 of 1 positions shown; findings below may reference images not displayed]

FINDINGS: The heart size and mediastinal contours are within normal limits.
Both lungs are clear. The visualized skeletal structures are
unremarkable.
IMPRESSION: No active disease.

## 2022-04-10 ENCOUNTER — Encounter: Payer: Self-pay | Admitting: Internal Medicine

## 2022-04-10 ENCOUNTER — Ambulatory Visit (INDEPENDENT_AMBULATORY_CARE_PROVIDER_SITE_OTHER): Payer: BC Managed Care – PPO | Admitting: Internal Medicine

## 2022-04-10 ENCOUNTER — Other Ambulatory Visit: Payer: Self-pay | Admitting: Internal Medicine

## 2022-04-10 VITALS — BP 160/78 | HR 75 | Temp 98.0°F | Resp 16 | Ht 60.0 in | Wt 181.0 lb

## 2022-04-10 DIAGNOSIS — R21 Rash and other nonspecific skin eruption: Secondary | ICD-10-CM | POA: Insufficient documentation

## 2022-04-10 DIAGNOSIS — J432 Centrilobular emphysema: Secondary | ICD-10-CM

## 2022-04-10 DIAGNOSIS — Z0000 Encounter for general adult medical examination without abnormal findings: Secondary | ICD-10-CM

## 2022-04-10 DIAGNOSIS — E039 Hypothyroidism, unspecified: Secondary | ICD-10-CM | POA: Diagnosis not present

## 2022-04-10 DIAGNOSIS — E876 Hypokalemia: Secondary | ICD-10-CM

## 2022-04-10 DIAGNOSIS — Z1231 Encounter for screening mammogram for malignant neoplasm of breast: Secondary | ICD-10-CM

## 2022-04-10 DIAGNOSIS — Z136 Encounter for screening for cardiovascular disorders: Secondary | ICD-10-CM | POA: Diagnosis not present

## 2022-04-10 DIAGNOSIS — Z23 Encounter for immunization: Secondary | ICD-10-CM | POA: Diagnosis not present

## 2022-04-10 DIAGNOSIS — E538 Deficiency of other specified B group vitamins: Secondary | ICD-10-CM | POA: Diagnosis not present

## 2022-04-10 DIAGNOSIS — N182 Chronic kidney disease, stage 2 (mild): Secondary | ICD-10-CM | POA: Diagnosis not present

## 2022-04-10 DIAGNOSIS — I10 Essential (primary) hypertension: Secondary | ICD-10-CM

## 2022-04-10 DIAGNOSIS — R7303 Prediabetes: Secondary | ICD-10-CM

## 2022-04-10 DIAGNOSIS — E269 Hyperaldosteronism, unspecified: Secondary | ICD-10-CM

## 2022-04-10 DIAGNOSIS — J301 Allergic rhinitis due to pollen: Secondary | ICD-10-CM

## 2022-04-10 LAB — BASIC METABOLIC PANEL
BUN: 14 mg/dL (ref 6–23)
CO2: 30 mEq/L (ref 19–32)
Calcium: 9.9 mg/dL (ref 8.4–10.5)
Chloride: 103 mEq/L (ref 96–112)
Creatinine, Ser: 0.92 mg/dL (ref 0.40–1.20)
GFR: 69.3 mL/min (ref >60.00)
Glucose, Bld: 101 mg/dL — ABNORMAL HIGH (ref 70–99)
Potassium: 3.3 mEq/L — ABNORMAL LOW (ref 3.5–5.1)
Sodium: 139 mEq/L (ref 135–145)

## 2022-04-10 LAB — TSH: TSH: 2 u[IU]/mL (ref 0.35–5.50)

## 2022-04-10 LAB — LIPID PANEL
Cholesterol: 195 mg/dL (ref 0–200)
HDL: 42.4 mg/dL (ref >39.00)
LDL Cholesterol: 114 mg/dL — ABNORMAL HIGH (ref 0–99)
NonHDL: 152.5
Total CHOL/HDL Ratio: 5
Triglycerides: 191 mg/dL — ABNORMAL HIGH (ref 0.0–149.0)
VLDL: 38.2 mg/dL (ref 0.0–40.0)

## 2022-04-10 LAB — FOLATE: Folate: 9.6 ng/mL (ref >5.9)

## 2022-04-10 LAB — VITAMIN B12: Vitamin B-12: 170 pg/mL — ABNORMAL LOW (ref 211–911)

## 2022-04-10 MED ORDER — TRIAMTERENE-HCTZ 37.5-25 MG PO CAPS: 1.0000 | ORAL_CAPSULE | Freq: Every day | ORAL | 1 refills | Status: DC

## 2022-04-10 MED ORDER — LEVOCETIRIZINE DIHYDROCHLORIDE 5 MG PO TABS: 5.0000 mg | ORAL_TABLET | Freq: Every evening | ORAL | 1 refills | Status: DC

## 2022-04-10 NOTE — Patient Instructions (Signed)
Hypertension, Adult High blood pressure (hypertension) is when the force of blood pumping through the arteries is too strong. The arteries are the blood vessels that carry blood from the heart throughout the body. Hypertension forces the heart to work harder to pump blood and may cause arteries to become narrow or stiff. Untreated or uncontrolled hypertension can lead to a heart attack, heart failure, a stroke, kidney disease, and other problems. A blood pressure reading consists of a higher number over a lower number. Ideally, your blood pressure should be below 120/80. The first ("top") number is called the systolic pressure. It is a measure of the pressure in your arteries as your heart beats. The second ("bottom") number is called the diastolic pressure. It is a measure of the pressure in your arteries as the heart relaxes. What are the causes? The exact cause of this condition is not known. There are some conditions that result in high blood pressure. What increases the risk? Certain factors may make you more likely to develop high blood pressure. Some of these risk factors are under your control, including: Smoking. Not getting enough exercise or physical activity. Being overweight. Having too much fat, sugar, calories, or salt (sodium) in your diet. Drinking too much alcohol. Other risk factors include: Having a personal history of heart disease, diabetes, high cholesterol, or kidney disease. Stress. Having a family history of high blood pressure and high cholesterol. Having obstructive sleep apnea. Age. The risk increases with age. What are the signs or symptoms? High blood pressure may not cause symptoms. Very high blood pressure (hypertensive crisis) may cause: Headache. Fast or irregular heartbeats (palpitations). Shortness of breath. Nosebleed. Nausea and vomiting. Vision changes. Severe chest pain, dizziness, and seizures. How is this diagnosed? This condition is diagnosed by  measuring your blood pressure while you are seated, with your arm resting on a flat surface, your legs uncrossed, and your feet flat on the floor. The cuff of the blood pressure monitor will be placed directly against the skin of your upper arm at the level of your heart. Blood pressure should be measured at least twice using the same arm. Certain conditions can cause a difference in blood pressure between your right and left arms. If you have a high blood pressure reading during one visit or you have normal blood pressure with other risk factors, you may be asked to: Return on a different day to have your blood pressure checked again. Monitor your blood pressure at home for 1 week or longer. If you are diagnosed with hypertension, you may have other blood or imaging tests to help your health care provider understand your overall risk for other conditions. How is this treated? This condition is treated by making healthy lifestyle changes, such as eating healthy foods, exercising more, and reducing your alcohol intake. You may be referred for counseling on a healthy diet and physical activity. Your health care provider may prescribe medicine if lifestyle changes are not enough to get your blood pressure under control and if: Your systolic blood pressure is above 130. Your diastolic blood pressure is above 80. Your personal target blood pressure may vary depending on your medical conditions, your age, and other factors. Follow these instructions at home: Eating and drinking  Eat a diet that is high in fiber and potassium, and low in sodium, added sugar, and fat. An example of this eating plan is called the DASH diet. DASH stands for Dietary Approaches to Stop Hypertension. To eat this way: Eat   plenty of fresh fruits and vegetables. Try to fill one half of your plate at each meal with fruits and vegetables. Eat whole grains, such as whole-wheat pasta, brown rice, or whole-grain bread. Fill about one  fourth of your plate with whole grains. Eat or drink low-fat dairy products, such as skim milk or low-fat yogurt. Avoid fatty cuts of meat, processed or cured meats, and poultry with skin. Fill about one fourth of your plate with lean proteins, such as fish, chicken without skin, beans, eggs, or tofu. Avoid pre-made and processed foods. These tend to be higher in sodium, added sugar, and fat. Reduce your daily sodium intake. Many people with hypertension should eat less than 1,500 mg of sodium a day. Do not drink alcohol if: Your health care provider tells you not to drink. You are pregnant, may be pregnant, or are planning to become pregnant. If you drink alcohol: Limit how much you have to: 0-1 drink a day for women. 0-2 drinks a day for men. Know how much alcohol is in your drink. In the [place] one drink equals one 12 oz bottle of beer (355 mL), one 5 oz glass of wine (148 mL), or one 1 oz glass of hard liquor (44 mL). Lifestyle  Work with your health care provider to maintain a healthy body weight or to lose weight. Ask what an ideal weight is for you. Get at least 30 minutes of exercise that causes your heart to beat faster (aerobic exercise) most days of the week. Activities may include walking, swimming, or biking. Include exercise to strengthen your muscles (resistance exercise), such as Pilates or lifting weights, as part of your weekly exercise routine. Try to do these types of exercises for 30 minutes at least 3 days a week. Do not use any products that contain nicotine or tobacco. These products include cigarettes, chewing tobacco, and vaping devices, such as e-cigarettes. If you need help quitting, ask your health care provider. Monitor your blood pressure at home as told by your health care provider. Keep all follow-up visits. This is important. Medicines Take over-the-counter and prescription medicines only as told by your health care provider. Follow directions carefully. Blood  pressure medicines must be taken as prescribed. Do not skip doses of blood pressure medicine. Doing this puts you at risk for problems and can make the medicine less effective. Ask your health care provider about side effects or reactions to medicines that you should watch for. Contact a health care provider if you: Think you are having a reaction to a medicine you are taking. Have headaches that keep coming back (recurring). Feel dizzy. Have swelling in your ankles. Have trouble with your vision. Get help right away if you: Develop a severe headache or confusion. Have unusual weakness or numbness. Feel faint. Have severe pain in your chest or abdomen. Vomit repeatedly. Have trouble breathing. These symptoms may be an emergency. Get help right away. Call 911. Do not wait to see if the symptoms will go away. Do not drive yourself to the hospital. Summary Hypertension is when the force of blood pumping through your arteries is too strong. If this condition is not controlled, it may put you at risk for serious complications. Your personal target blood pressure may vary depending on your medical conditions, your age, and other factors. For most people, a normal blood pressure is less than 120/80. Hypertension is treated with lifestyle changes, medicines, or a combination of both. Lifestyle changes include losing weight, eating a healthy,   low-sodium diet, exercising more, and limiting alcohol. This information is not intended to replace advice given to you by your health care provider. Make sure you discuss any questions you have with your health care provider. Document Revised: 08/22/2021 Document Reviewed: 08/22/2021 Elsevier Patient Education  2023 [employer]  

## 2022-04-10 NOTE — Progress Notes (Signed)
Subjective:  Patient ID: Sarah Watts, female    DOB: 1965/12/05  Age: 56 y.o. MRN: [id number]  CC: Annual Exam, Hypertension, and Hyperlipidemia   HPI Sarah Watts presents for a CPX and f/up -  Sarah Watts complains of a 53-monthhistory of rash on her left upper breast.  Sarah Watts describes it as red spots that do not cause any symptoms.  Sarah Watts has been treating it with topical alcohol and hydrogen peroxide with no relief.  Sarah Watts is active and complains of dyspnea on exertion but Sarah Watts denies chest pain, diaphoresis, dizziness, lightheadedness, or edema.  There appears to be some noncompliance with antihypertensives.  Outpatient Medications Prior to Visit  Medication Sig Dispense Refill   albuterol (VENTOLIN HFA) 108 (90 Base) MCG/ACT inhaler Inhale 2 puffs into the lungs every 6 (six) hours as needed for wheezing or shortness of breath. 8 g 0   cefdinir (OMNICEF) 300 MG capsule Take 1 capsule (300 mg total) by mouth 2 (two) times daily. 20 capsule 0   fluticasone (FLONASE) 50 MCG/ACT nasal spray Use 2 spray(s) in each nostril once daily 48 g 1   folic acid (FOLVITE) 1 MG tablet Take 1 tablet (1 mg total) by mouth daily. 90 tablet 1   levothyroxine (SYNTHROID) 75 MCG tablet Take 1 tablet (75 mcg total) by mouth daily. 90 tablet 1   Budeson-Glycopyrrol-Formoterol (BREZTRI AEROSPHERE) 160-9-4.8 MCG/ACT AERO Inhale 2 puffs into the lungs 2 (two) times daily. 10.7 g 11   EDARBI 80 MG TABS TAKE 1 TABLET ONCE DAILY. 30 tablet 5   levocetirizine (XYZAL) 5 MG tablet Take 1 tablet (5 mg total) by mouth every evening. 90 tablet 1   triamterene-hydrochlorothiazide (DYAZIDE) 37.5-25 MG capsule Take 1 each (1 capsule total) by mouth daily. 90 capsule 1   No facility-administered medications prior to visit.    ROS Review of Systems  Constitutional: Negative.  Negative for diaphoresis and fatigue.  HENT: Negative.    Eyes: Negative.   Respiratory:  Positive for shortness of breath. Negative for cough,  chest tightness and wheezing.   Cardiovascular:  Negative for chest pain, palpitations and leg swelling.  Gastrointestinal:  Negative for abdominal pain, constipation, diarrhea, nausea and vomiting.  Endocrine: Negative.   Genitourinary: Negative.  Negative for difficulty urinating.  Musculoskeletal: Negative.   Skin:  Positive for rash.  Neurological:  Negative for dizziness, weakness, light-headedness, numbness and headaches.  Hematological:  Negative for adenopathy. Does not bruise/bleed easily.  Psychiatric/Behavioral: Negative.      Objective:  BP (!) 160/78 (BP Location: Left Arm, Patient Position: Sitting, Cuff Size: Large)   Pulse 75   Temp 98 F (36.7 C) (Oral)   Resp 16   Ht 5' (1.524 m)   Wt 181 lb (82.1 kg)   SpO2 97%   BMI 35.35 kg/m   BP Readings from Last 3 Encounters:  04/10/22 (!) 160/78  01/08/22 (!) 150/78  09/26/21 (!) 160/80    Wt Readings from Last 3 Encounters:  04/10/22 181 lb (82.1 kg)  01/08/22 180 lb (81.6 kg)  09/26/21 171 lb (77.6 kg)    Physical Exam Vitals reviewed.  HENT:     Nose: Nose normal.     Mouth/Throat:     Mouth: Mucous membranes are moist.  Eyes:     General: No scleral icterus.    Conjunctiva/sclera: Conjunctivae normal.  Cardiovascular:     Rate and Rhythm: Normal rate and regular rhythm.     Heart sounds: No murmur  heard. Pulmonary:     Effort: Pulmonary effort is normal.     Breath sounds: No stridor. No wheezing, rhonchi or rales.  Abdominal:     General: Abdomen is protuberant. Bowel sounds are normal.     Palpations: There is no hepatomegaly, splenomegaly or mass.     Tenderness: There is no abdominal tenderness. There is no guarding.  Musculoskeletal:        General: Normal range of motion.     Cervical back: Neck supple.     Right lower leg: No edema.     Left lower leg: No edema.  Lymphadenopathy:     Cervical: No cervical adenopathy.  Skin:    General: Skin is warm and dry.     Findings: Rash  present.     Comments: 3 erythematous macules on left upper breast.  Neurological:     General: No focal deficit present.     Mental Status: Sarah Watts is alert.  Psychiatric:        Mood and Affect: Mood normal.        Behavior: Behavior normal.     Lab Results  Component Value Date   WBC 7.9 01/08/2022   HGB 13.4 01/08/2022   HCT 40.7 01/08/2022   PLT 376.0 01/08/2022   GLUCOSE 101 (H) 04/10/2022   CHOL 195 04/10/2022   TRIG 191.0 (H) 04/10/2022   HDL 42.40 04/10/2022   LDLDIRECT 82.0 08/28/2018   LDLCALC 114 (H) 04/10/2022   ALT 16 01/08/2022   AST 16 01/08/2022   NA 139 04/10/2022   K 3.3 (L) 04/10/2022   CL 103 04/10/2022   CREATININE 0.92 04/10/2022   BUN 14 04/10/2022   CO2 30 04/10/2022   TSH 2.00 04/10/2022   HGBA1C 6.2 01/08/2022    DG Chest Port 1 View  Result Date: 09/14/2021 CLINICAL DATA:  Cough and shortness of breath. EXAM: PORTABLE CHEST 1 VIEW COMPARISON:  August 19, 2021 FINDINGS: The heart size and mediastinal contours are within normal limits. Both lungs are clear. The visualized skeletal structures are unremarkable. IMPRESSION: No active disease. Electronically Signed   By: Virgina Norfolk M.D.   On: 09/14/2021 22:48    Assessment & Plan:   Sarah Watts was seen today for annual exam, hypertension and hyperlipidemia.  Diagnoses and all orders for this visit:  Essential hypertension- Her blood pressure is not adequately well controlled.  Will restart the antihypertensives.  Will check labs to screen for secondary causes and endorgan damage. -     Basic metabolic panel; Future -     Urinalysis, Routine w reflex microscopic; Future -     Urinalysis, Routine w reflex microscopic -     Basic metabolic panel -     triamterene-hydrochlorothiazide (DYAZIDE) 37.5-25 MG capsule; Take 1 each (1 capsule total) by mouth daily. -     irbesartan (AVAPRO) 300 MG tablet; Take 1 tablet (300 mg total) by mouth daily. -     Aldosterone + renin activity w/ ratio;  Future -     Magnesium; Future -     potassium chloride (KLOR-CON 10) 10 MEQ tablet; Take 1 tablet (10 mEq total) by mouth 2 (two) times daily.  Acquired hypothyroidism- Sarah Watts is euthyroid. -     TSH; Future -     TSH  Chronic renal disease, stage 2, mildly decreased glomerular filtration rate (GFR) between 60-89 mL/min/1.73 square meter- Will try to get better control of her blood pressure. -     Basic metabolic panel;  Future -     Urinalysis, Routine w reflex microscopic; Future -     Urinalysis, Routine w reflex microscopic -     Basic metabolic panel  I50 nutritional deficiency-  Her B12 level remains low.  I have asked her to be more compliant with parenteral B12 replacement therapy. -     Vitamin B12; Future -     Folate; Future -     Folate -     Vitamin B12  Prediabetes  Routine general medical examination at a health care facility- Exam completed, labs reviewed, vaccines reviewed and updated, cancer screenings addressed, patient education was given. -     Lipid panel; Future -     Lipid panel  Non-seasonal allergic rhinitis due to pollen -     levocetirizine (XYZAL) 5 MG tablet; Take 1 tablet (5 mg total) by mouth every evening.  Seasonal allergic rhinitis due to pollen -     levocetirizine (XYZAL) 5 MG tablet; Take 1 tablet (5 mg total) by mouth every evening.  Rash in adult -     Ambulatory referral to Dermatology  Visit for screening mammogram -     MM DIGITAL SCREENING BILATERAL; Future  Hypokalemia -     Aldosterone + renin activity w/ ratio; Future -     Magnesium; Future -     potassium chloride (KLOR-CON 10) 10 MEQ tablet; Take 1 tablet (10 mEq total) by mouth 2 (two) times daily.  Centrilobular emphysema (HCC) -     Budeson-Glycopyrrol-Formoterol (BREZTRI AEROSPHERE) 160-9-4.8 MCG/ACT AERO; Inhale 2 puffs into the lungs 2 (two) times daily.  Other orders -     Varicella-zoster vaccine IM (Shingrix)   I have discontinued Jeralene Huff. Bouza's Earnest Rosier. I  am also having her start on irbesartan and potassium chloride. Additionally, I am having her maintain her albuterol, folic acid, fluticasone, cefdinir, levothyroxine, triamterene-hydrochlorothiazide, levocetirizine, and Breztri Aerosphere.  Meds ordered this encounter  Medications   triamterene-hydrochlorothiazide (DYAZIDE) 37.5-25 MG capsule    Sig: Take 1 each (1 capsule total) by mouth daily.    Dispense:  90 capsule    Refill:  1   levocetirizine (XYZAL) 5 MG tablet    Sig: Take 1 tablet (5 mg total) by mouth every evening.    Dispense:  90 tablet    Refill:  1   irbesartan (AVAPRO) 300 MG tablet    Sig: Take 1 tablet (300 mg total) by mouth daily.    Dispense:  90 tablet    Refill:  0   potassium chloride (KLOR-CON 10) 10 MEQ tablet    Sig: Take 1 tablet (10 mEq total) by mouth 2 (two) times daily.    Dispense:  180 tablet    Refill:  0   Budeson-Glycopyrrol-Formoterol (BREZTRI AEROSPHERE) 160-9-4.8 MCG/ACT AERO    Sig: Inhale 2 puffs into the lungs 2 (two) times daily.    Dispense:  32.1 g    Refill:  1     Follow-up: Return in about 3 months (around 07/11/2022).  Scarlette Calico, MD

## 2022-04-11 DIAGNOSIS — E876 Hypokalemia: Secondary | ICD-10-CM | POA: Insufficient documentation

## 2022-04-11 LAB — URINALYSIS, ROUTINE W REFLEX MICROSCOPIC
Bilirubin Urine: NEGATIVE
Hgb urine dipstick: NEGATIVE
Ketones, ur: NEGATIVE
Leukocytes,Ua: NEGATIVE
Nitrite: NEGATIVE
RBC / HPF: NONE SEEN (ref 0–?)
Specific Gravity, Urine: <=1.005 — AB (ref 1.000–1.030)
Total Protein, Urine: NEGATIVE
Urine Glucose: NEGATIVE
Urobilinogen, UA: 0.2 (ref 0.0–1.0)
pH: 6 (ref 5.0–8.0)

## 2022-04-11 MED ORDER — BREZTRI AEROSPHERE 160-9-4.8 MCG/ACT IN AERO: 2.0000 | INHALATION_SPRAY | Freq: Two times a day (BID) | RESPIRATORY_TRACT | 1 refills | Status: DC

## 2022-04-11 MED ORDER — POTASSIUM CHLORIDE ER 10 MEQ PO TBCR: 10.0000 meq | EXTENDED_RELEASE_TABLET | Freq: Two times a day (BID) | ORAL | 0 refills | Status: DC

## 2022-04-11 MED ORDER — IRBESARTAN 300 MG PO TABS: 300.0000 mg | ORAL_TABLET | Freq: Every day | ORAL | 0 refills | Status: DC

## 2022-04-12 ENCOUNTER — Other Ambulatory Visit (INDEPENDENT_AMBULATORY_CARE_PROVIDER_SITE_OTHER): Payer: BC Managed Care – PPO

## 2022-04-12 DIAGNOSIS — I10 Essential (primary) hypertension: Secondary | ICD-10-CM | POA: Diagnosis not present

## 2022-04-12 DIAGNOSIS — E876 Hypokalemia: Secondary | ICD-10-CM

## 2022-04-12 LAB — MAGNESIUM: Magnesium: 2 mg/dL (ref 1.5–2.5)

## 2022-04-16 ENCOUNTER — Telehealth: Payer: Self-pay | Admitting: Internal Medicine

## 2022-04-16 NOTE — Telephone Encounter (Signed)
Pt returned phone call regarding test results. I advised her "Your potassium level is low - please come back to the lab for an additional lab test. I have prescribed a potassium supplement. Also, you B12 level is low - please come in for a B12 injection. The other labs are okay." PT is currently out of town so I scheduled her B12 for 7/15, upon earlier arrival advised pt she can reschedule to an earlier date.   Fyi

## 2022-04-23 ENCOUNTER — Other Ambulatory Visit: Payer: BC Managed Care – PPO

## 2022-04-23 ENCOUNTER — Ambulatory Visit (INDEPENDENT_AMBULATORY_CARE_PROVIDER_SITE_OTHER): Payer: BC Managed Care – PPO

## 2022-04-23 DIAGNOSIS — E538 Deficiency of other specified B group vitamins: Secondary | ICD-10-CM

## 2022-04-23 MED ORDER — CYANOCOBALAMIN 1000 MCG/ML IJ SOLN
1000.0000 ug | Freq: Once | INTRAMUSCULAR | Status: AC
  Administered 2022-04-23: 1000 ug via INTRAMUSCULAR

## 2022-04-25 ENCOUNTER — Telehealth: Payer: Self-pay

## 2022-04-25 NOTE — Telephone Encounter (Signed)
Pt is calling after getting a recording saying that she needed to schedule her Mammogram now. Pt stated she hasn't had one in 3 years.  Please advise

## 2022-04-25 NOTE — Telephone Encounter (Signed)
Thank you I will let pt know. I provided the pt with the number to call to get scheduled

## 2022-04-28 LAB — ALDOSTERONE + RENIN ACTIVITY W/ RATIO
ALDO / PRA Ratio: 32.4 Ratio — ABNORMAL HIGH (ref 0.9–28.9)
Aldosterone: 11 ng/dL
Renin Activity: 0.34 ng/mL/h (ref 0.25–5.82)

## 2022-04-29 DIAGNOSIS — E269 Hyperaldosteronism, unspecified: Secondary | ICD-10-CM | POA: Insufficient documentation

## 2022-04-29 NOTE — Addendum Note (Signed)
Addended by: Janith Lima on: 04/29/2022 12:27 PM   Modules accepted: Orders

## 2022-05-02 ENCOUNTER — Ambulatory Visit (INDEPENDENT_AMBULATORY_CARE_PROVIDER_SITE_OTHER): Payer: BC Managed Care – PPO

## 2022-05-02 DIAGNOSIS — E538 Deficiency of other specified B group vitamins: Secondary | ICD-10-CM

## 2022-05-02 MED ORDER — CYANOCOBALAMIN 1000 MCG/ML IJ SOLN
1000.0000 ug | Freq: Once | INTRAMUSCULAR | Status: AC
  Administered 2022-05-02: 1000 ug via INTRAMUSCULAR

## 2022-05-02 NOTE — Progress Notes (Signed)
Pt here for monthly B12 injection per Dr Jones.  B12 1000mcg given IM left deltoid and pt tolerated injection well.  Pt to schedule next B12 injection upon check out.    

## 2022-05-02 NOTE — Progress Notes (Signed)
After obtaining consent, and per orders of Dr. Jones, injection of B12 given by Jaquita Bessire. Patient tolerated injection well in right deltoid and instructed to report any adverse reaction to me immediately.  

## 2022-05-09 ENCOUNTER — Ambulatory Visit (INDEPENDENT_AMBULATORY_CARE_PROVIDER_SITE_OTHER): Payer: BC Managed Care – PPO

## 2022-05-09 DIAGNOSIS — E538 Deficiency of other specified B group vitamins: Secondary | ICD-10-CM | POA: Diagnosis not present

## 2022-05-09 MED ORDER — CYANOCOBALAMIN 1000 MCG/ML IJ SOLN
1000.0000 ug | Freq: Once | INTRAMUSCULAR | Status: AC
  Administered 2022-05-09: 1000 ug via INTRAMUSCULAR

## 2022-05-09 NOTE — Progress Notes (Signed)
After obtaining consent, and per orders of Dr. Jones, injection of B12 given by Satoria Dunlop. Patient tolerated injection well in left deltoid and instructed to report any adverse reaction to me immediately.  

## 2022-05-16 ENCOUNTER — Ambulatory Visit (INDEPENDENT_AMBULATORY_CARE_PROVIDER_SITE_OTHER): Payer: BC Managed Care – PPO

## 2022-05-16 DIAGNOSIS — E538 Deficiency of other specified B group vitamins: Secondary | ICD-10-CM | POA: Diagnosis not present

## 2022-05-16 MED ORDER — CYANOCOBALAMIN 1000 MCG/ML IJ SOLN
1000.0000 ug | Freq: Once | INTRAMUSCULAR | Status: AC
  Administered 2022-05-16: 1000 ug via INTRAMUSCULAR

## 2022-05-16 NOTE — Progress Notes (Signed)
After obtaining consent, and per orders of Dr. Ronnald Ramp, injection of b12 given on the right deltoid by Marrian Salvage. Patient tolerate well and was instructed to report any adverse reaction to me immediately.

## 2022-06-15 ENCOUNTER — Ambulatory Visit (INDEPENDENT_AMBULATORY_CARE_PROVIDER_SITE_OTHER): Payer: BC Managed Care – PPO

## 2022-06-15 DIAGNOSIS — E538 Deficiency of other specified B group vitamins: Secondary | ICD-10-CM

## 2022-06-15 MED ORDER — CYANOCOBALAMIN 1000 MCG/ML IJ SOLN
1000.0000 ug | Freq: Once | INTRAMUSCULAR | Status: AC
  Administered 2022-06-15: 1000 ug via INTRAMUSCULAR

## 2022-06-15 NOTE — Progress Notes (Signed)
After obtaining consent, and per orders of Dr. Ronnald Ramp, injection of B12 given by Max Sane. Patient tolerated injection well in right deltoid, and instructed to report any adverse reaction to me immediately.

## 2022-06-19 ENCOUNTER — Ambulatory Visit (INDEPENDENT_AMBULATORY_CARE_PROVIDER_SITE_OTHER): Payer: BC Managed Care – PPO | Admitting: "Endocrinology

## 2022-06-19 ENCOUNTER — Encounter: Payer: Self-pay | Admitting: "Endocrinology

## 2022-06-19 VITALS — BP 140/80 | HR 56 | Ht 60.0 in | Wt 180.6 lb

## 2022-06-19 DIAGNOSIS — I10 Essential (primary) hypertension: Secondary | ICD-10-CM | POA: Diagnosis not present

## 2022-06-19 DIAGNOSIS — E876 Hypokalemia: Secondary | ICD-10-CM | POA: Diagnosis not present

## 2022-06-19 DIAGNOSIS — E669 Obesity, unspecified: Secondary | ICD-10-CM

## 2022-06-19 DIAGNOSIS — E349 Endocrine disorder, unspecified: Secondary | ICD-10-CM

## 2022-06-19 MED ORDER — POTASSIUM CHLORIDE ER 20 MEQ PO TBCR: 20.0000 meq | EXTENDED_RELEASE_TABLET | Freq: Two times a day (BID) | ORAL | 1 refills | Status: DC

## 2022-06-19 NOTE — Progress Notes (Unsigned)
Endocrinology Consult Note                                            06/19/2022, 3:21 PM   Subjective:    Patient ID: Sarah Watts, female    DOB: 05-02-1966, PCP Janith Lima, MD   Past Medical History:  Diagnosis Date  . Allergic rhinitis   . Heart murmur   . HTN (hypertension)   . Irregular heartbeat    Past Surgical History:  Procedure Laterality Date  . APPENDECTOMY    . CESAREAN SECTION    . CHOLECYSTECTOMY    . FACIAL RECONSTRUCTION SURGERY     Left, due to birth defect  . TONSILLECTOMY     Social History   Socioeconomic History  . Marital status: Married    Spouse name: Not on file  . Number of children: Y  . Years of education: Not on file  . Highest education level: Not on file  Occupational History  . Occupation: Therapist, music: [id number]  Tobacco Use  . Smoking status: Former    Packs/day: 0.50    Years: 15.00    Total pack years: 7.50    Types: Cigarettes    Quit date: 06/07/2020    Years since quitting: 2.0  . Smokeless tobacco: Never  Vaping Use  . Vaping Use: Former  Substance and Sexual Activity  . Alcohol use: No  . Drug use: No  . Sexual activity: Yes    Comment: female partner  Other Topics Concern  . Not on file  Social History Narrative   Domestic Partner - Female, Estate manager/land agent   Regular Exercise -  NO   Social Determinants of Health   Financial Resource Strain: Not on file  Food Insecurity: Not on file  Transportation Needs: Not on file  Physical Activity: Not on file  Stress: Not on file  Social Connections: Not on file   Family History  Problem Relation Age of Onset  . Hypertension Mother   . Allergies Mother   . Breast cancer Mother   . Heart attack Mother   . Stroke Mother   . Cancer Father   . Stroke Father   . Heart attack Father   . Hypertension Father   . Diabetes Father   . Allergies Father   . Heart disease Father   . Allergies Sister   . Asthma Sister   . Diabetes Other        1st  degree relative  . Hypertension Other   . Prostate cancer Other        1st degree relative <50  . Colon cancer Neg Hx    Outpatient Encounter Medications as of 06/19/2022  Medication Sig  . Cyanocobalamin (VITAMIN B-12 IJ) Inject as directed every 30 (thirty) days.  . Esomeprazole Magnesium (NEXIUM PO) Take 1 tablet by mouth daily.  Marland Kitchen albuterol (VENTOLIN HFA) 108 (90 Base) MCG/ACT inhaler Inhale 2 puffs into the lungs every 6 (six) hours as needed for wheezing or shortness of breath.  . Budeson-Glycopyrrol-Formoterol (BREZTRI AEROSPHERE) 160-9-4.8 MCG/ACT AERO Inhale 2 puffs into the lungs 2 (two) times daily.  . cefdinir (OMNICEF) 300 MG capsule Take 1 capsule (300 mg total) by mouth 2 (two) times daily. (Patient not taking: Reported on 06/19/2022)  . fluticasone (FLONASE) 50 MCG/ACT nasal spray Use 2  spray(s) in each nostril once daily  . folic acid (FOLVITE) 1 MG tablet Take 1 tablet (1 mg total) by mouth daily. (Patient not taking: Reported on 06/19/2022)  . irbesartan (AVAPRO) 300 MG tablet Take 1 tablet (300 mg total) by mouth daily.  Marland Kitchen levocetirizine (XYZAL) 5 MG tablet Take 1 tablet (5 mg total) by mouth every evening.  Marland Kitchen levothyroxine (SYNTHROID) 75 MCG tablet Take 1 tablet (75 mcg total) by mouth daily.  . potassium chloride 20 MEQ TBCR Take 20 mEq by mouth 2 (two) times daily.  Marland Kitchen triamterene-hydrochlorothiazide (DYAZIDE) 37.5-25 MG capsule Take 1 each (1 capsule total) by mouth daily.  . [DISCONTINUED] potassium chloride (KLOR-CON 10) 10 MEQ tablet Take 1 tablet (10 mEq total) by mouth 2 (two) times daily.   No facility-administered encounter medications on file as of 06/19/2022.   ALLERGIES: Allergies  Allergen Reactions  . Shellfish Allergy Nausea And Vomiting  . Codeine     REACTION: tachycardia  . Tramadol     Nausea and vomiting    VACCINATION STATUS: Immunization History  Administered Date(s) Administered  . Hepatitis B 11/19/2017, 12/20/2017, 06/05/2018  .  Hepatitis B, adult 05/29/2018  . Influenza,inj,Quad PF,6+ Mos 11/12/2014, 08/05/2017, 08/28/2018  . Influenza-Unspecified 08/08/2013, 10/29/2014, 08/28/2019  . Moderna Sars-Covid-2 Vaccination 10/14/2020  . PNEUMOCOCCAL CONJUGATE-20 03/09/2021  . Pneumococcal Polysaccharide-23 06/17/2013, 11/09/2017  . Tdap 02/13/2013  . Zoster Recombinat (Shingrix) 04/10/2022    HPI Sarah Watts is 56 y.o. female who presents today with a medical history as above. she is being seen in consultation for *** requested by Janith Lima, MD.  she has been dealing with symptoms of  ***, ***, ***, and *** for ***. she denies ***.  Review of Systems  Constitutional: no recent weight gain/loss, no fatigue, no subjective hyperthermia, no subjective hypothermia Eyes: no blurry vision, no xerophthalmia ENT: no sore throat, no nodules palpated in throat, no dysphagia/odynophagia, no hoarseness Cardiovascular: no Chest Pain, no Shortness of Breath, no palpitations, no leg swelling Respiratory: no cough, no shortness of breath Gastrointestinal: no Nausea/Vomiting/Diarhhea Musculoskeletal: no muscle/joint aches Skin: no rashes Neurological: no tremors, no numbness, no tingling, no dizziness Psychiatric: no depression, no anxiety  Objective:       06/19/2022    1:12 PM 04/10/2022    3:18 PM 01/08/2022    3:08 PM  Vitals with BMI  Height '5\' 0"'$  '5\' 0"'$  '5\' 0"'$   Weight 180 lbs 10 oz 181 lbs 180 lbs  BMI 35.27 25.05 39.76  Systolic 734 193 790  Diastolic 80 78 78  Pulse 56 75 63    BP (!) 140/80   Pulse (!) 56   Ht 5' (1.524 m)   Wt 180 lb 9.6 oz (81.9 kg)   BMI 35.27 kg/m   Wt Readings from Last 3 Encounters:  06/19/22 180 lb 9.6 oz (81.9 kg)  04/10/22 181 lb (82.1 kg)  01/08/22 180 lb (81.6 kg)    Physical Exam  Constitutional:  Body mass index is 35.27 kg/m.,  not in acute distress, normal state of mind Eyes: PERRLA, EOMI, no exophthalmos ENT: moist mucous membranes, no gross thyromegaly, no  gross cervical lymphadenopathy Cardiovascular: normal precordial activity, Regular Rate and Rhythm, no Murmur/Rubs/Gallops Respiratory:  adequate breathing efforts, no gross chest deformity, Clear to auscultation bilaterally Gastrointestinal: abdomen soft, Non -tender, No distension, Bowel Sounds present, no gross organomegaly Musculoskeletal: no gross deformities, strength intact in all four extremities Skin: moist, warm, no rashes Neurological: no tremor with outstretched hands,  Deep tendon reflexes normal in bilateral lower extremities.  CMP ( most recent) CMP     Component Value Date/Time   NA 139 04/10/2022 1506   K 3.3 (L) 04/10/2022 1506   CL 103 04/10/2022 1506   CO2 30 04/10/2022 1506   GLUCOSE 101 (H) 04/10/2022 1506   BUN 14 04/10/2022 1506   CREATININE 0.92 04/10/2022 1506   CREATININE 0.91 06/14/2020 1421   CALCIUM 9.9 04/10/2022 1506   PROT 7.0 01/08/2022 1533   ALBUMIN 4.3 01/08/2022 1533   AST 16 01/08/2022 1533   ALT 16 01/08/2022 1533   ALKPHOS 66 01/08/2022 1533   BILITOT 0.4 01/08/2022 1533   GFRNONAA >60 09/14/2021 2333   GFRNONAA 71 06/14/2020 1421   GFRAA 82 06/14/2020 1421     Diabetic Labs (most recent): Lab Results  Component Value Date   HGBA1C 6.2 01/08/2022   HGBA1C 6.4 03/09/2021   HGBA1C 6.3 04/07/2020     Lipid Panel ( most recent) Lipid Panel     Component Value Date/Time   CHOL 195 04/10/2022 1506   TRIG 191.0 (H) 04/10/2022 1506   HDL 42.40 04/10/2022 1506   CHOLHDL 5 04/10/2022 1506   VLDL 38.2 04/10/2022 1506   LDLCALC 114 (H) 04/10/2022 1506   LDLDIRECT 82.0 08/28/2018 0932      Lab Results  Component Value Date   TSH 2.00 04/10/2022   TSH 6.20 (H) 01/08/2022   TSH 0.37 03/09/2021   TSH 1.46 04/07/2020   TSH 0.68 10/08/2019   TSH 0.77 02/26/2019   TSH 1.00 08/28/2018   TSH 1.27 04/17/2018   TSH 2.84 12/30/2017   TSH 1.16 03/18/2017           Assessment & Plan:   1. Endocrine disorder, unspecified *** -  Comprehensive metabolic panel - Aldosterone + renin activity w/ ratio  2. Hypokalemia *** - potassium chloride 20 MEQ TBCR; Take 20 mEq by mouth 2 (two) times daily.  Dispense: 60 tablet; Refill: 1  3. Essential hypertension, benign ***  4. Essential hypertension *** - potassium chloride 20 MEQ TBCR; Take 20 mEq by mouth 2 (two) times daily.  Dispense: 60 tablet; Refill: 1   - Sarah Watts  is being seen at a kind request of Janith Lima, MD. - I have reviewed her available *** records and clinically evaluated the patient. - Based on these reviews, she has ***,  however,  there is not sufficient information to proceed with definitive treatment plan.  - she will need a repeat,  more complete *** towards confirming the diagnosis.  -she will return in *** week to review her repeat labs.   If her  labs are suggestive of ***, she will be considered for *** to confirm the diagnosis.  - I did not initiate any new prescriptions today. - she is advised to maintain close follow up with Janith Lima, MD for primary care needs.   - Time spent with the patient: 60 minutes, of which >50% was spent in  counseling her about her *** and the rest in obtaining information about her symptoms, reviewing her previous labs/studies ( including abstractions from other facilities),  evaluations, and treatments,  and developing a plan to confirm diagnosis and long term treatment based on the latest standards of care/guidelines; and documenting her care.  Sarah Watts in the discussions, expressed understanding, and voiced agreement with the above plans.  All questions were answered to her satisfaction. she is encouraged to contact  clinic should she have any questions or concerns prior to her return visit.  Follow up plan: Return in about 5 weeks (around 07/24/2022) for F/U with Pre-visit Labs, [place] with Kendy Haston.   Glade Lloyd, MD [facility] [address] [place], [place] [address] Phone: [phone]  Fax: [phone]     06/19/2022, 3:21 PM  This note was partially dictated with voice recognition software. Similar sounding words can be transcribed inadequately or may not  be corrected upon review.

## 2022-06-27 ENCOUNTER — Ambulatory Visit
Admission: RE | Admit: 2022-06-27 | Discharge: 2022-06-27 | Disposition: A | Discharge status: Home / Self Care | Payer: BC Managed Care – PPO | Source: Ambulatory Visit | Attending: Internal Medicine | Admitting: Internal Medicine

## 2022-06-27 DIAGNOSIS — J432 Centrilobular emphysema: Secondary | ICD-10-CM | POA: Diagnosis not present

## 2022-06-27 DIAGNOSIS — Z87891 Personal history of nicotine dependence: Secondary | ICD-10-CM | POA: Diagnosis not present

## 2022-06-27 DIAGNOSIS — I700 Atherosclerosis of aorta: Secondary | ICD-10-CM | POA: Diagnosis not present

## 2022-06-27 DIAGNOSIS — I2510 Atherosclerotic heart disease of native coronary artery without angina pectoris: Secondary | ICD-10-CM | POA: Diagnosis not present

## 2022-06-28 ENCOUNTER — Other Ambulatory Visit: Payer: Self-pay | Admitting: Acute Care

## 2022-06-28 DIAGNOSIS — Z122 Encounter for screening for malignant neoplasm of respiratory organs: Secondary | ICD-10-CM

## 2022-06-28 DIAGNOSIS — Z87891 Personal history of nicotine dependence: Secondary | ICD-10-CM

## 2022-07-05 ENCOUNTER — Other Ambulatory Visit: Payer: Self-pay | Admitting: Internal Medicine

## 2022-07-05 DIAGNOSIS — I10 Essential (primary) hypertension: Secondary | ICD-10-CM

## 2022-07-06 ENCOUNTER — Other Ambulatory Visit: Payer: Self-pay | Admitting: Internal Medicine

## 2022-07-06 DIAGNOSIS — J410 Simple chronic bronchitis: Secondary | ICD-10-CM

## 2022-07-06 DIAGNOSIS — J432 Centrilobular emphysema: Secondary | ICD-10-CM

## 2022-07-06 DIAGNOSIS — Z20822 Contact with and (suspected) exposure to covid-19: Secondary | ICD-10-CM

## 2022-07-06 DIAGNOSIS — E039 Hypothyroidism, unspecified: Secondary | ICD-10-CM

## 2022-07-06 MED ORDER — ALBUTEROL SULFATE HFA 108 (90 BASE) MCG/ACT IN AERS: 2.0000 | INHALATION_SPRAY | Freq: Four times a day (QID) | RESPIRATORY_TRACT | 5 refills | Status: AC | PRN

## 2022-07-13 DIAGNOSIS — E349 Endocrine disorder, unspecified: Secondary | ICD-10-CM | POA: Diagnosis not present

## 2022-07-16 ENCOUNTER — Ambulatory Visit (INDEPENDENT_AMBULATORY_CARE_PROVIDER_SITE_OTHER): Payer: BC Managed Care – PPO

## 2022-07-16 DIAGNOSIS — E538 Deficiency of other specified B group vitamins: Secondary | ICD-10-CM | POA: Diagnosis not present

## 2022-07-16 MED ORDER — CYANOCOBALAMIN 1000 MCG/ML IJ SOLN
1000.0000 ug | Freq: Once | INTRAMUSCULAR | Status: AC
  Administered 2022-07-16: 1000 ug via INTRAMUSCULAR

## 2022-07-16 NOTE — Progress Notes (Signed)
After obtaining consent, and per orders of Dr. Ronnald Ramp, injection of B12 given on the right deltoid by Marrian Salvage. Patient instructed to report any adverse reaction to me immediately.

## 2022-07-22 LAB — COMPREHENSIVE METABOLIC PANEL
ALT: 21 IU/L (ref 0–32)
AST: 18 IU/L (ref 0–40)
Albumin/Globulin Ratio: 1.9 (ref 1.2–2.2)
Albumin: 4.8 g/dL (ref 3.8–4.9)
Alkaline Phosphatase: 102 IU/L (ref 44–121)
BUN/Creatinine Ratio: 14 (ref 9–23)
BUN: 14 mg/dL (ref 6–24)
Bilirubin Total: 0.5 mg/dL (ref 0.0–1.2)
CO2: 24 mmol/L (ref 20–29)
Calcium: 10.6 mg/dL — ABNORMAL HIGH (ref 8.7–10.2)
Chloride: 96 mmol/L (ref 96–106)
Creatinine, Ser: 1.01 mg/dL — ABNORMAL HIGH (ref 0.57–1.00)
Globulin, Total: 2.5 g/dL (ref 1.5–4.5)
Glucose: 124 mg/dL — ABNORMAL HIGH (ref 70–99)
Potassium: 4.1 mmol/L (ref 3.5–5.2)
Sodium: 136 mmol/L (ref 134–144)
Total Protein: 7.3 g/dL (ref 6.0–8.5)
eGFR: 65 mL/min/{1.73_m2} (ref >59)

## 2022-07-22 LAB — ALDOSTERONE + RENIN ACTIVITY W/ RATIO
ALDOS/RENIN RATIO: 0.6 (ref 0.0–30.0)
ALDOSTERONE: 23.3 ng/dL (ref 0.0–30.0)
Renin: 39.189 ng/mL/hr — ABNORMAL HIGH (ref 0.167–5.380)

## 2022-07-23 ENCOUNTER — Ambulatory Visit: Payer: BC Managed Care – PPO | Admitting: Internal Medicine

## 2022-07-30 ENCOUNTER — Ambulatory Visit: Payer: BC Managed Care – PPO | Admitting: "Endocrinology

## 2022-07-31 ENCOUNTER — Ambulatory Visit: Payer: BC Managed Care – PPO | Admitting: Internal Medicine

## 2022-08-06 ENCOUNTER — Ambulatory Visit (INDEPENDENT_AMBULATORY_CARE_PROVIDER_SITE_OTHER): Payer: BC Managed Care – PPO | Admitting: "Endocrinology

## 2022-08-06 ENCOUNTER — Encounter: Payer: Self-pay | Admitting: "Endocrinology

## 2022-08-06 VITALS — BP 124/72 | HR 80 | Ht 60.0 in | Wt 182.2 lb

## 2022-08-06 DIAGNOSIS — I10 Essential (primary) hypertension: Secondary | ICD-10-CM

## 2022-08-06 DIAGNOSIS — E349 Endocrine disorder, unspecified: Secondary | ICD-10-CM

## 2022-08-06 DIAGNOSIS — E039 Hypothyroidism, unspecified: Secondary | ICD-10-CM | POA: Diagnosis not present

## 2022-08-06 DIAGNOSIS — E876 Hypokalemia: Secondary | ICD-10-CM

## 2022-08-06 MED ORDER — HYDROCHLOROTHIAZIDE 25 MG PO TABS: 25.0000 mg | ORAL_TABLET | Freq: Every day | ORAL | 1 refills | Status: DC

## 2022-08-06 NOTE — Progress Notes (Signed)
08/06/2022, 6:06 PM  Endocrinology follow-up note   Subjective:    Patient ID: Sarah Watts, female    DOB: 05-22-1966, PCP Janith Lima, MD   Past Medical History:  Diagnosis Date   Allergic rhinitis    Heart murmur    HTN (hypertension)    Irregular heartbeat    Past Surgical History:  Procedure Laterality Date   APPENDECTOMY     CESAREAN SECTION     CHOLECYSTECTOMY     FACIAL RECONSTRUCTION SURGERY     Left, due to birth defect   TONSILLECTOMY     Social History   Socioeconomic History   Marital status: Married    Spouse name: Not on file   Number of children: Y   Years of education: Not on file   Highest education level: Not on file  Occupational History   Occupation: Therapist, music: [id number]  Tobacco Use   Smoking status: Former    Packs/day: 0.50    Years: 15.00    Total pack years: 7.50    Types: Cigarettes    Quit date: 06/07/2020    Years since quitting: 2.1   Smokeless tobacco: Never  Vaping Use   Vaping Use: Former  Substance and Sexual Activity   Alcohol use: No   Drug use: No   Sexual activity: Yes    Comment: female partner  Other Topics Concern   Not on file  Social History Narrative   Domestic Partner - Female, Estate manager/land agent   Regular Exercise -  NO   Social Determinants of Health   Financial Resource Strain: Not on file  Food Insecurity: Not on file  Transportation Needs: Not on file  Physical Activity: Not on file  Stress: Not on file  Social Connections: Not on file   Family History  Problem Relation Age of Onset   Hypertension Mother    Allergies Mother    Breast cancer Mother    Heart attack Mother    Stroke Mother    Cancer Father    Stroke Father    Heart attack Father    Hypertension Father    Diabetes Father    Allergies Father    Heart disease Father    Allergies Sister    Asthma Sister    Diabetes Other        1st degree relative    Hypertension Other    Prostate cancer Other        1st degree relative <50   Colon cancer Neg Hx    Outpatient Encounter Medications as of 08/06/2022  Medication Sig   hydrochlorothiazide (HYDRODIURIL) 25 MG tablet Take 1 tablet (25 mg total) by mouth daily.   albuterol (VENTOLIN HFA) 108 (90 Base) MCG/ACT inhaler Inhale 2 puffs into the lungs every 6 (six) hours as needed for wheezing or shortness of breath.   Budeson-Glycopyrrol-Formoterol (BREZTRI AEROSPHERE) 160-9-4.8 MCG/ACT AERO Inhale 2 puffs into the lungs 2 (two) times daily. (Patient not taking: Reported on 08/06/2022)   Cyanocobalamin (VITAMIN B-12 IJ) Inject as directed every 30 (thirty) days. (Patient not taking: Reported on 08/06/2022)   Esomeprazole Magnesium (NEXIUM PO) Take 1 tablet by mouth daily.   fluticasone (FLONASE) 50 MCG/ACT  nasal spray Use 2 spray(s) in each nostril once daily   folic acid (FOLVITE) 1 MG tablet Take 1 tablet (1 mg total) by mouth daily. (Patient not taking: Reported on 06/19/2022)   irbesartan (AVAPRO) 300 MG tablet Take 1 tablet by mouth once daily   levocetirizine (XYZAL) 5 MG tablet Take 1 tablet (5 mg total) by mouth every evening.   levothyroxine (SYNTHROID) 75 MCG tablet Take 1 tablet by mouth once daily   potassium chloride 20 MEQ TBCR Take 20 mEq by mouth 2 (two) times daily.   [DISCONTINUED] triamterene-hydrochlorothiazide (DYAZIDE) 37.5-25 MG capsule Take 1 capsule by mouth once daily   No facility-administered encounter medications on file as of 08/06/2022.   ALLERGIES: Allergies  Allergen Reactions   Shellfish Allergy Nausea And Vomiting   Codeine     REACTION: tachycardia   Tramadol     Nausea and vomiting    VACCINATION STATUS: Immunization History  Administered Date(s) Administered   Hepatitis B 11/19/2017, 12/20/2017, 06/05/2018   Hepatitis B, adult 05/29/2018   Influenza,inj,Quad PF,6+ Mos 11/12/2014, 08/05/2017, 08/28/2018   Influenza-Unspecified 08/08/2013, 10/29/2014,  08/28/2019   Moderna Sars-Covid-2 Vaccination 10/14/2020   PNEUMOCOCCAL CONJUGATE-20 03/09/2021   Pneumococcal Polysaccharide-23 06/17/2013, 11/09/2017   Tdap 02/13/2013   Zoster Recombinat (Shingrix) 04/10/2022    HPI Sarah Watts is 56 y.o. female who presents today with a medical history as above. she is being seen in consultation for abnormal PAC/PRA  pressure requested by Janith Lima, MD.   See notes from her previous visit.  She is known to have hypertension for the last 20 years.  She was treated with various combinations of medications not more than 2 medications at the time. More recently, she was observed to have hypokalemia which is resolved with potassium supplements.    Lab work in June 2023 revealed slight elevation of PAC/PRA.  More recent similar labs are discograms. She does not report any prior history of adrenal, thyroid, parathyroid, pituitary dysfunction. She does not have any other adrenal imaging studies. Her blood pressure is optimally controlled at this time.   Her medications currently include irbesartan 300 mg p.o. daily, Dyazide triamterene-hydrochlorothiazide 37.5-25 mg p.o. daily. She has taken  this Dyazide for at least 5 years. She denies coronary artery disease, CVA, CKD. Her other medical problems include hypothyroidism, obstructive sleep apnea, prediabetes, hyperlipidemia. She is on levothyroxine 75 mcg p.o. daily. Her most recent labs on April 10, 2022 potassium corrected at 4.1 from 2.7 back in November 2022.     She is a former smoker with more than 20-pack-year history of smoking.   Review of Systems  Constitutional: + Minimally fluctuating body weight, + fatigue, no subjective hyperthermia, no subjective hypothermia Eyes: no blurry vision, no xerophthalmia   Objective:       08/06/2022    2:39 PM 06/19/2022    1:12 PM 04/10/2022    3:18 PM  Vitals with BMI  Height 5' 0" 5' 0" 5' 0"  Weight 182 lbs 3 oz 180 lbs 10 oz 181 lbs  BMI  35.58 25.63 89.37  Systolic 342 876 811  Diastolic 72 80 78  Pulse 80 56 75    BP 124/72   Pulse 80   Ht 5' (1.524 m)   Wt 182 lb 3.2 oz (82.6 kg)   BMI 35.58 kg/m   Wt Readings from Last 3 Encounters:  08/06/22 182 lb 3.2 oz (82.6 kg)  06/19/22 180 lb 9.6 oz (81.9 kg)  04/10/22 181  lb (82.1 kg)    Physical Exam  Constitutional:  Body mass index is 35.58 kg/m.,  not in acute distress, normal state of mind Eyes: PERRLA, EOMI, no exophthalmos ENT: moist mucous membranes, no gross thyromegaly, no gross cervical lymphadenopathy   CMP ( most recent) CMP     Diabetic Labs (most recent): Lab Results  Component Value Date   HGBA1C 6.2 01/08/2022   HGBA1C 6.4 03/09/2021   HGBA1C 6.3 04/07/2020     Lipid Panel ( most recent) Lipid Panel     Component Value Date/Time   CHOL 195 04/10/2022 1506   TRIG 191.0 (H) 04/10/2022 1506   HDL 42.40 04/10/2022 1506   CHOLHDL 5 04/10/2022 1506   VLDL 38.2 04/10/2022 1506   LDLCALC 114 (H) 04/10/2022 1506   LDLDIRECT 82.0 08/28/2018 0932      Lab Results  Component Value Date   TSH 2.00 04/10/2022   TSH 6.20 (H) 01/08/2022   TSH 0.37 03/09/2021   TSH 1.46 04/07/2020   TSH 0.68 10/08/2019   TSH 0.77 02/26/2019   TSH 1.00 08/28/2018   TSH 1.27 04/17/2018   TSH 2.84 12/30/2017   TSH 1.16 03/18/2017         Latest Reference Range & Units Most Recent  ALDOSTERONE  ng/dL 11 04/12/22 08:18  ALDO / PRA Ratio 0.9 - 28.9 Ratio 32.4 (H) 04/12/22 08:18  Glucose 70 - 99 mg/dL 101 (H) 04/10/22 15:06  Hemoglobin A1C 4.6 - 6.5 % 6.2 01/08/22 15:33  TSH 0.35 - 5.50 uIU/mL 2.00 04/10/22 15:06  (H): Data is abnormally high  Recent Results (from the past 2160 hour(s))  Comprehensive metabolic panel     Status: Abnormal   Collection Time: 07/13/22 11:05 AM  Result Value Ref Range   Glucose 124 (H) 70 - 99 mg/dL   BUN 14 6 - 24 mg/dL   Creatinine, Ser 1.01 (H) 0.57 - 1.00 mg/dL   eGFR 65 >59 mL/min/1.73   BUN/Creatinine Ratio 14 9  - 23   Sodium 136 134 - 144 mmol/L   Potassium 4.1 3.5 - 5.2 mmol/L   Chloride 96 96 - 106 mmol/L   CO2 24 20 - 29 mmol/L   Calcium 10.6 (H) 8.7 - 10.2 mg/dL   Total Protein 7.3 6.0 - 8.5 g/dL   Albumin 4.8 3.8 - 4.9 g/dL   Globulin, Total 2.5 1.5 - 4.5 g/dL   Albumin/Globulin Ratio 1.9 1.2 - 2.2   Bilirubin Total 0.5 0.0 - 1.2 mg/dL   Alkaline Phosphatase 102 44 - 121 IU/L   AST 18 0 - 40 IU/L   ALT 21 0 - 32 IU/L  Aldosterone + renin activity w/ ratio     Status: Abnormal   Collection Time: 07/13/22 11:05 AM  Result Value Ref Range   ALDOSTERONE 23.3 0.0 - 30.0 ng/dL   Renin 39.189 (H) 0.167 - 5.380 ng/mL/hr   ALDOS/RENIN RATIO 0.6 0.0 - 30.0    Comment:                          Units:      ng/dL per ng/mL/hr     Assessment & Plan:   1. Endocrine disorder, unspecified 2. Hypokalemia 3. Essential hypertension, benign 4. Essential hypertension  I discussed her existing and new labs with her. - Based on these reviews, her repeat labs do not show elevation of PAC/PRA in last visit.  Her recent labs show rather elevation of renin.  -  These findings are discordant and did not confirm a diagnosis of primary hyperaldosteronism. With her aldosterone at 11-23 it is unlikely that she has primary hyperaldosteronism.   She will need continued correction of hypokalemia with plan to repeat labs.  She will need further work-up to classify aldosterone imbalance she has. -She is advised to discontinue Dyazide at this time.  She will be continued on ARB (irbesartan 300 mg) as well as HCTZ 25 mg p.o. daily at breakfast.    I advised her to continue potassium supplements  20 mg p.o. twice daily.  Review of her medical records did not reveal any prior imaging involving the adrenals. If her biochemical work-up is indicative, she will be considered for adrenal imaging on a later date.  In light of her metabolic dysfunction indicated by prediabetes, hyperlipidemia, hypertension, obesity, sleep  apnea this patient is a good fit for lifestyle medicine. She is advised to measure her blood pressure daily at home and report if they continue to be above 140/80. For her hypothyroidism, she is advised to continue levothyroxine 75 mcg p.o. daily before breakfast.   - she is advised to maintain close follow up with Janith Lima, MD for primary care needs.    I spent 21 minutes in the care of the patient today including review of labs from Thyroid Function, CMP, and other relevant labs ; imaging/biopsy records (current and previous including abstractions from other facilities); face-to-face time discussing  her lab results and symptoms, medications doses, her options of short and long term treatment based on the latest standards of care / guidelines;   and documenting the encounter.  Sarah Watts  participated in the discussions, expressed understanding, and voiced agreement with the above plans.  All questions were answered to her satisfaction. she is encouraged to contact clinic should she have any questions or concerns prior to her return visit.   Follow up plan: Return in about 4 months (around 12/07/2022) for F/U with Pre-visit Labs.   Glade Lloyd, MD [facility] [address] [place], [place] [address] Phone: [phone]  Fax: [phone]     08/06/2022, 6:06 PM  This note was partially dictated with voice recognition software. Similar sounding words can be transcribed inadequately or may not  be corrected upon review.

## 2022-09-04 ENCOUNTER — Encounter: Payer: Self-pay | Admitting: Internal Medicine

## 2022-09-04 ENCOUNTER — Ambulatory Visit (INDEPENDENT_AMBULATORY_CARE_PROVIDER_SITE_OTHER): Payer: BC Managed Care – PPO

## 2022-09-04 ENCOUNTER — Ambulatory Visit (INDEPENDENT_AMBULATORY_CARE_PROVIDER_SITE_OTHER): Payer: BC Managed Care – PPO | Admitting: Internal Medicine

## 2022-09-04 VITALS — BP 178/92 | HR 77 | Temp 98.9°F | Resp 16 | Ht 60.0 in | Wt 181.0 lb

## 2022-09-04 DIAGNOSIS — R051 Acute cough: Secondary | ICD-10-CM

## 2022-09-04 DIAGNOSIS — R1012 Left upper quadrant pain: Secondary | ICD-10-CM | POA: Insufficient documentation

## 2022-09-04 DIAGNOSIS — E538 Deficiency of other specified B group vitamins: Secondary | ICD-10-CM

## 2022-09-04 DIAGNOSIS — E269 Hyperaldosteronism, unspecified: Secondary | ICD-10-CM | POA: Diagnosis not present

## 2022-09-04 DIAGNOSIS — E118 Type 2 diabetes mellitus with unspecified complications: Secondary | ICD-10-CM

## 2022-09-04 DIAGNOSIS — N182 Chronic kidney disease, stage 2 (mild): Secondary | ICD-10-CM | POA: Diagnosis not present

## 2022-09-04 DIAGNOSIS — E039 Hypothyroidism, unspecified: Secondary | ICD-10-CM

## 2022-09-04 DIAGNOSIS — R7303 Prediabetes: Secondary | ICD-10-CM | POA: Diagnosis not present

## 2022-09-04 DIAGNOSIS — R059 Cough, unspecified: Secondary | ICD-10-CM | POA: Diagnosis not present

## 2022-09-04 DIAGNOSIS — I10 Essential (primary) hypertension: Secondary | ICD-10-CM

## 2022-09-04 DIAGNOSIS — R0600 Dyspnea, unspecified: Secondary | ICD-10-CM | POA: Diagnosis not present

## 2022-09-04 LAB — CBC WITH DIFFERENTIAL/PLATELET
Basophils Absolute: 0.1 10*3/uL (ref 0.0–0.1)
Basophils Relative: 0.7 % (ref 0.0–3.0)
Eosinophils Absolute: 0.2 10*3/uL (ref 0.0–0.7)
Eosinophils Relative: 1.5 % (ref 0.0–5.0)
HCT: 40.8 % (ref 36.0–46.0)
Hemoglobin: 13.4 g/dL (ref 12.0–15.0)
Lymphocytes Relative: 17.6 % (ref 12.0–46.0)
Lymphs Abs: 1.8 10*3/uL (ref 0.7–4.0)
MCHC: 32.9 g/dL (ref 30.0–36.0)
MCV: 86.2 fl (ref 78.0–100.0)
Monocytes Absolute: 0.7 10*3/uL (ref 0.1–1.0)
Monocytes Relative: 6.6 % (ref 3.0–12.0)
Neutro Abs: 7.7 10*3/uL (ref 1.4–7.7)
Neutrophils Relative %: 73.6 % (ref 43.0–77.0)
Platelets: 355 10*3/uL (ref 150.0–400.0)
RBC: 4.73 Mil/uL (ref 3.87–5.11)
RDW: 13.7 % (ref 11.5–15.5)
WBC: 10.4 10*3/uL (ref 4.0–10.5)

## 2022-09-04 LAB — URINALYSIS, ROUTINE W REFLEX MICROSCOPIC
Bilirubin Urine: NEGATIVE
Hgb urine dipstick: NEGATIVE
Ketones, ur: NEGATIVE
Leukocytes,Ua: NEGATIVE
Nitrite: NEGATIVE
RBC / HPF: NONE SEEN (ref 0–?)
Specific Gravity, Urine: <=1.005 — AB (ref 1.000–1.030)
Total Protein, Urine: NEGATIVE
Urine Glucose: NEGATIVE
Urobilinogen, UA: 0.2 (ref 0.0–1.0)
WBC, UA: NONE SEEN (ref 0–?)
pH: 6.5 (ref 5.0–8.0)

## 2022-09-04 LAB — BASIC METABOLIC PANEL
BUN: 15 mg/dL (ref 6–23)
CO2: 31 mEq/L (ref 19–32)
Calcium: 10.2 mg/dL (ref 8.4–10.5)
Chloride: 101 mEq/L (ref 96–112)
Creatinine, Ser: 0.94 mg/dL (ref 0.40–1.20)
GFR: 67.35 mL/min (ref >60.00)
Glucose, Bld: 104 mg/dL — ABNORMAL HIGH (ref 70–99)
Potassium: 3.7 mEq/L (ref 3.5–5.1)
Sodium: 140 mEq/L (ref 135–145)

## 2022-09-04 LAB — POCT INFLUENZA A/B
Influenza A, POC: NEGATIVE
Influenza B, POC: NEGATIVE

## 2022-09-04 LAB — TSH: TSH: 2.13 u[IU]/mL (ref 0.35–5.50)

## 2022-09-04 LAB — POC COVID19 BINAXNOW: SARS Coronavirus 2 Ag: NEGATIVE

## 2022-09-04 LAB — HEMOGLOBIN A1C: Hgb A1c MFr Bld: 6.6 % — ABNORMAL HIGH (ref 4.6–6.5)

## 2022-09-04 MED ORDER — SPIRONOLACTONE 50 MG PO TABS: 50.0000 mg | ORAL_TABLET | Freq: Every day | ORAL | 0 refills | Status: DC

## 2022-09-04 MED ORDER — HYDROCODONE BIT-HOMATROP MBR 5-1.5 MG/5ML PO SOLN: 5.0000 mL | Freq: Three times a day (TID) | ORAL | 0 refills | Status: AC | PRN

## 2022-09-04 NOTE — Progress Notes (Signed)
Subjective:  Patient ID: Sarah Watts, female    DOB: 1966/04/12  Age: 56 y.o. MRN: [id number]  CC: Hypertension, COPD, Cough, and Hypothyroidism   HPI Sarah Watts presents for f/up -  She complains that hydrochlorothiazide is not controlling her blood pressure.  She has had systolic blood pressures as high as 160 recently.  She recently saw an endocrinologist and she believes she was told that she has hyperaldosteronism.  She also has chronic left upper quadrant abdominal pain and she was told to ask me to order a scan of her abdomen.  She complains of a 2-day history of cough productive of yellow phlegm with shortness of breath and chills.  She denies fever.  She has chronic unchanged night sweats.  Outpatient Medications Prior to Visit  Medication Sig Dispense Refill   albuterol (VENTOLIN HFA) 108 (90 Base) MCG/ACT inhaler Inhale 2 puffs into the lungs every 6 (six) hours as needed for wheezing or shortness of breath. 18 g 5   Budeson-Glycopyrrol-Formoterol (BREZTRI AEROSPHERE) 160-9-4.8 MCG/ACT AERO Inhale 2 puffs into the lungs 2 (two) times daily. 32.1 g 1   Esomeprazole Magnesium (NEXIUM PO) Take 1 tablet by mouth daily.     fluticasone (FLONASE) 50 MCG/ACT nasal spray Use 2 spray(s) in each nostril once daily 48 g 1   irbesartan (AVAPRO) 300 MG tablet Take 1 tablet by mouth once daily 90 tablet 0   levocetirizine (XYZAL) 5 MG tablet Take 1 tablet (5 mg total) by mouth every evening. 90 tablet 1   levothyroxine (SYNTHROID) 75 MCG tablet Take 1 tablet by mouth once daily 90 tablet 0   Cyanocobalamin (VITAMIN B-12 IJ) Inject as directed every 30 (thirty) days.     folic acid (FOLVITE) 1 MG tablet Take 1 tablet (1 mg total) by mouth daily. 90 tablet 1   hydrochlorothiazide (HYDRODIURIL) 25 MG tablet Take 1 tablet (25 mg total) by mouth daily. 90 tablet 1   potassium chloride 20 MEQ TBCR Take 20 mEq by mouth 2 (two) times daily. 60 tablet 1   No facility-administered  medications prior to visit.    ROS Review of Systems  Constitutional:  Positive for chills. Negative for diaphoresis, fatigue and fever.  HENT: Negative.  Negative for sore throat and trouble swallowing.   Respiratory:  Positive for cough and shortness of breath. Negative for chest tightness and wheezing.   Cardiovascular:  Negative for chest pain, palpitations and leg swelling.  Gastrointestinal:  Negative for abdominal pain, constipation, diarrhea, nausea and vomiting.  Endocrine: Negative.   Genitourinary: Negative.  Negative for difficulty urinating.  Musculoskeletal:  Negative for arthralgias, joint swelling and myalgias.  Skin: Negative.   Neurological: Negative.  Negative for dizziness, weakness and light-headedness.  Hematological:  Negative for adenopathy. Does not bruise/bleed easily.  Psychiatric/Behavioral: Negative.      Objective:  BP (!) 178/92 (BP Location: Right Arm, Patient Position: Sitting, Cuff Size: Large)   Pulse 77   Temp 98.9 F (37.2 C) (Oral)   Resp 16   Ht 5' (1.524 m)   Wt 181 lb (82.1 kg)   SpO2 99%   BMI 35.35 kg/m   BP Readings from Last 3 Encounters:  09/04/22 (!) 178/92  08/06/22 124/72  06/19/22 (!) 140/80    Wt Readings from Last 3 Encounters:  09/04/22 181 lb (82.1 kg)  08/06/22 182 lb 3.2 oz (82.6 kg)  06/19/22 180 lb 9.6 oz (81.9 kg)    Physical Exam Vitals reviewed.  Constitutional:  General: She is not in acute distress.    Appearance: Normal appearance. She is obese. She is not ill-appearing, toxic-appearing or diaphoretic.  Eyes:     General: No scleral icterus.    Pupils: Pupils are equal, round, and reactive to light.  Cardiovascular:     Rate and Rhythm: Normal rate and regular rhythm.     Heart sounds: No murmur heard. Pulmonary:     Effort: Pulmonary effort is normal.     Breath sounds: No stridor. No wheezing, rhonchi or rales.  Abdominal:     General: Abdomen is protuberant. There is no distension.      Palpations: Abdomen is soft. There is no hepatomegaly, splenomegaly or mass.     Tenderness: There is abdominal tenderness in the left upper quadrant. There is no guarding.     Hernia: No hernia is present.  Musculoskeletal:        General: Normal range of motion.     Cervical back: Neck supple.     Right lower leg: No edema.     Left lower leg: No edema.  Lymphadenopathy:     Cervical: No cervical adenopathy.  Neurological:     General: No focal deficit present.     Mental Status: She is alert and oriented to person, place, and time.  Psychiatric:        Mood and Affect: Mood normal.        Behavior: Behavior normal.     Lab Results  Component Value Date   WBC 10.4 09/04/2022   HGB 13.4 09/04/2022   HCT 40.8 09/04/2022   PLT 355.0 09/04/2022   GLUCOSE 104 (H) 09/04/2022   CHOL 195 04/10/2022   TRIG 191.0 (H) 04/10/2022   HDL 42.40 04/10/2022   LDLDIRECT 82.0 08/28/2018   LDLCALC 114 (H) 04/10/2022   ALT 21 07/13/2022   AST 18 07/13/2022   NA 140 09/04/2022   K 3.7 09/04/2022   CL 101 09/04/2022   CREATININE 0.94 09/04/2022   BUN 15 09/04/2022   CO2 31 09/04/2022   TSH 2.13 09/04/2022   HGBA1C 6.6 (H) 09/04/2022    CT CHEST LUNG CA SCREEN LOW DOSE W/O CM  Result Date: 06/27/2022 CLINICAL DATA:  Former smoker with 30 pack-year history EXAM: CT CHEST WITHOUT CONTRAST LOW-DOSE FOR LUNG CANCER SCREENING TECHNIQUE: Multidetector CT imaging of the chest was performed following the standard protocol without IV contrast. RADIATION DOSE REDUCTION: This exam was performed according to the departmental dose-optimization program which includes automated exposure control, adjustment of the mA and/or kV according to patient size and/or use of iterative reconstruction technique. COMPARISON:  Lung cancer screening CT dated June 27, 2021 FINDINGS: Cardiovascular: Normal heart size. No pericardial effusion. Normal caliber thoracic aorta with mild calcified plaque. No significant coronary  artery calcifications. Mediastinum/Nodes: Esophagus and thyroid are unremarkable. No pathologically enlarged lymph nodes seen in the chest. Lungs/Pleura: Central airways are patent. Mild centrilobular emphysema. No consolidation, pleural effusion or pneumothorax. Upper Abdomen: No acute abnormality. Musculoskeletal: No chest wall mass or suspicious bone lesions identified. IMPRESSION: 1. Lung-RADS 1, negative. Continue annual screening with low-dose chest CT without contrast in 12 months. 2. Aortic Atherosclerosis (ICD10-I70.0) and Emphysema (ICD10-J43.9). Electronically Signed   By: Yetta Glassman M.D.   On: 06/27/2022 15:04  DG Chest 2 View  Result Date: 09/04/2022 CLINICAL DATA:  Dyspnea on exertion, cough. EXAM: CHEST - 2 VIEW COMPARISON:  September 14, 2021. FINDINGS: The heart size and mediastinal contours are within normal limits. Both  lungs are clear. The visualized skeletal structures are unremarkable. IMPRESSION: No active cardiopulmonary disease. Electronically Signed   By: Marijo Conception M.D.   On: 09/04/2022 14:30     Assessment & Plan:   Tiesha was seen today for hypertension, copd, cough and hypothyroidism.  Diagnoses and all orders for this visit:  Acute cough- Chest x-ray is negative for infiltrate.  Testing for influenza and COVID is negative.  This is a viral URI. -     DG Chest 2 View; Future -     POCT Influenza A/B -     POC COVID-19 -     HYDROcodone bit-homatropine (HYCODAN) 5-1.5 MG/5ML syrup; Take 5 mLs by mouth every 8 (eight) hours as needed for up to 8 days for cough.  Essential hypertension- Her blood pressure is not adequately well controlled.  Will treat the hyperaldosteronism. -     Basic metabolic panel; Future -     CBC with Differential/Platelet; Future -     Urinalysis, Routine w reflex microscopic; Future -     Urinalysis, Routine w reflex microscopic -     CBC with Differential/Platelet -     Basic metabolic panel -     spironolactone (ALDACTONE) 50  MG tablet; Take 1 tablet (50 mg total) by mouth daily.  Chronic renal disease, stage 2, mildly decreased glomerular filtration rate (GFR) between 60-89 mL/min/1.73 square meter -     Urinalysis, Routine w reflex microscopic; Future -     Urinalysis, Routine w reflex microscopic  Prediabetes- She has developed DM 2. -     Hemoglobin A1c; Future -     Hemoglobin A1c  B12 nutritional deficiency -     CBC with Differential/Platelet; Future -     CBC with Differential/Platelet  Hyperaldosteronism (HCC) -     Basic metabolic panel; Future -     MR Abdomen W Wo Contrast; Future -     Basic metabolic panel -     spironolactone (ALDACTONE) 50 MG tablet; Take 1 tablet (50 mg total) by mouth daily.  Acquired hypothyroidism- She is euthyroid. -     TSH; Future -     TSH  Left upper quadrant abdominal pain -     MR Abdomen W Wo Contrast; Future  Type II diabetes mellitus with manifestations ([place])   I have discontinued Jeralene Huff. Margraf's folic acid, Cyanocobalamin (VITAMIN B-12 IJ), Potassium Chloride ER, and hydrochlorothiazide. I am also having her start on HYDROcodone bit-homatropine and spironolactone. Additionally, I am having her maintain her fluticasone, levocetirizine, Breztri Aerosphere, Esomeprazole Magnesium (NEXIUM PO), irbesartan, albuterol, and levothyroxine.  Meds ordered this encounter  Medications   HYDROcodone bit-homatropine (HYCODAN) 5-1.5 MG/5ML syrup    Sig: Take 5 mLs by mouth every 8 (eight) hours as needed for up to 8 days for cough.    Dispense:  120 mL    Refill:  0   spironolactone (ALDACTONE) 50 MG tablet    Sig: Take 1 tablet (50 mg total) by mouth daily.    Dispense:  90 tablet    Refill:  0     Follow-up: Return in about 3 weeks (around 09/25/2022).  Scarlette Calico, MD

## 2022-09-04 NOTE — Patient Instructions (Signed)

## 2022-09-05 ENCOUNTER — Telehealth: Payer: Self-pay

## 2022-09-05 DIAGNOSIS — E118 Type 2 diabetes mellitus with unspecified complications: Secondary | ICD-10-CM | POA: Insufficient documentation

## 2022-09-05 NOTE — Telephone Encounter (Signed)
Pt has been informed to d/c potassium due to Rx interaction with spironolactone per pharmacy. She has been informed that PCP will monitor K+ levels and expressed understanding.

## 2022-09-05 NOTE — Telephone Encounter (Signed)
Pt pharmacy called and wanted to know if it was ok for refill of pt  spironolactone (ALDACTONE) 50 MG tablet since with that and her other medication will raise her sodium level up

## 2022-09-15 DIAGNOSIS — M5442 Lumbago with sciatica, left side: Secondary | ICD-10-CM | POA: Diagnosis not present

## 2022-09-18 ENCOUNTER — Encounter: Payer: Self-pay | Admitting: Internal Medicine

## 2022-09-23 ENCOUNTER — Other Ambulatory Visit: Payer: BC Managed Care – PPO

## 2022-09-25 ENCOUNTER — Ambulatory Visit (INDEPENDENT_AMBULATORY_CARE_PROVIDER_SITE_OTHER): Payer: BC Managed Care – PPO | Admitting: Internal Medicine

## 2022-09-25 ENCOUNTER — Encounter: Payer: Self-pay | Admitting: Internal Medicine

## 2022-09-25 VITALS — BP 154/96 | HR 94 | Temp 97.7°F | Ht 60.0 in | Wt 180.0 lb

## 2022-09-25 DIAGNOSIS — N644 Mastodynia: Secondary | ICD-10-CM

## 2022-09-25 DIAGNOSIS — E118 Type 2 diabetes mellitus with unspecified complications: Secondary | ICD-10-CM

## 2022-09-25 DIAGNOSIS — R1012 Left upper quadrant pain: Secondary | ICD-10-CM

## 2022-09-25 DIAGNOSIS — Z1231 Encounter for screening mammogram for malignant neoplasm of breast: Secondary | ICD-10-CM

## 2022-09-25 DIAGNOSIS — E269 Hyperaldosteronism, unspecified: Secondary | ICD-10-CM

## 2022-09-25 NOTE — Patient Instructions (Signed)
Hypertension, Adult High blood pressure (hypertension) is when the force of blood pumping through the arteries is too strong. The arteries are the blood vessels that carry blood from the heart throughout the body. Hypertension forces the heart to work harder to pump blood and may cause arteries to become narrow or stiff. Untreated or uncontrolled hypertension can lead to a heart attack, heart failure, a stroke, kidney disease, and other problems. A blood pressure reading consists of a higher number over a lower number. Ideally, your blood pressure should be below 120/80. The first ("top") number is called the systolic pressure. It is a measure of the pressure in your arteries as your heart beats. The second ("bottom") number is called the diastolic pressure. It is a measure of the pressure in your arteries as the heart relaxes. What are the causes? The exact cause of this condition is not known. There are some conditions that result in high blood pressure. What increases the risk? Certain factors may make you more likely to develop high blood pressure. Some of these risk factors are under your control, including: Smoking. Not getting enough exercise or physical activity. Being overweight. Having too much fat, sugar, calories, or salt (sodium) in your diet. Drinking too much alcohol. Other risk factors include: Having a personal history of heart disease, diabetes, high cholesterol, or kidney disease. Stress. Having a family history of high blood pressure and high cholesterol. Having obstructive sleep apnea. Age. The risk increases with age. What are the signs or symptoms? High blood pressure may not cause symptoms. Very high blood pressure (hypertensive crisis) may cause: Headache. Fast or irregular heartbeats (palpitations). Shortness of breath. Nosebleed. Nausea and vomiting. Vision changes. Severe chest pain, dizziness, and seizures. How is this diagnosed? This condition is diagnosed by  measuring your blood pressure while you are seated, with your arm resting on a flat surface, your legs uncrossed, and your feet flat on the floor. The cuff of the blood pressure monitor will be placed directly against the skin of your upper arm at the level of your heart. Blood pressure should be measured at least twice using the same arm. Certain conditions can cause a difference in blood pressure between your right and left arms. If you have a high blood pressure reading during one visit or you have normal blood pressure with other risk factors, you may be asked to: Return on a different day to have your blood pressure checked again. Monitor your blood pressure at home for 1 week or longer. If you are diagnosed with hypertension, you may have other blood or imaging tests to help your health care provider understand your overall risk for other conditions. How is this treated? This condition is treated by making healthy lifestyle changes, such as eating healthy foods, exercising more, and reducing your alcohol intake. You may be referred for counseling on a healthy diet and physical activity. Your health care provider may prescribe medicine if lifestyle changes are not enough to get your blood pressure under control and if: Your systolic blood pressure is above 130. Your diastolic blood pressure is above 80. Your personal target blood pressure may vary depending on your medical conditions, your age, and other factors. Follow these instructions at home: Eating and drinking  Eat a diet that is high in fiber and potassium, and low in sodium, added sugar, and fat. An example of this eating plan is called the DASH diet. DASH stands for Dietary Approaches to Stop Hypertension. To eat this way: Eat   plenty of fresh fruits and vegetables. Try to fill one half of your plate at each meal with fruits and vegetables. Eat whole grains, such as whole-wheat pasta, brown rice, or whole-grain bread. Fill about one  fourth of your plate with whole grains. Eat or drink low-fat dairy products, such as skim milk or low-fat yogurt. Avoid fatty cuts of meat, processed or cured meats, and poultry with skin. Fill about one fourth of your plate with lean proteins, such as fish, chicken without skin, beans, eggs, or tofu. Avoid pre-made and processed foods. These tend to be higher in sodium, added sugar, and fat. Reduce your daily sodium intake. Many people with hypertension should eat less than 1,500 mg of sodium a day. Do not drink alcohol if: Your health care provider tells you not to drink. You are pregnant, may be pregnant, or are planning to become pregnant. If you drink alcohol: Limit how much you have to: 0-1 drink a day for women. 0-2 drinks a day for men. Know how much alcohol is in your drink. In the [place] one drink equals one 12 oz bottle of beer (355 mL), one 5 oz glass of wine (148 mL), or one 1 oz glass of hard liquor (44 mL). Lifestyle  Work with your health care provider to maintain a healthy body weight or to lose weight. Ask what an ideal weight is for you. Get at least 30 minutes of exercise that causes your heart to beat faster (aerobic exercise) most days of the week. Activities may include walking, swimming, or biking. Include exercise to strengthen your muscles (resistance exercise), such as Pilates or lifting weights, as part of your weekly exercise routine. Try to do these types of exercises for 30 minutes at least 3 days a week. Do not use any products that contain nicotine or tobacco. These products include cigarettes, chewing tobacco, and vaping devices, such as e-cigarettes. If you need help quitting, ask your health care provider. Monitor your blood pressure at home as told by your health care provider. Keep all follow-up visits. This is important. Medicines Take over-the-counter and prescription medicines only as told by your health care provider. Follow directions carefully. Blood  pressure medicines must be taken as prescribed. Do not skip doses of blood pressure medicine. Doing this puts you at risk for problems and can make the medicine less effective. Ask your health care provider about side effects or reactions to medicines that you should watch for. Contact a health care provider if you: Think you are having a reaction to a medicine you are taking. Have headaches that keep coming back (recurring). Feel dizzy. Have swelling in your ankles. Have trouble with your vision. Get help right away if you: Develop a severe headache or confusion. Have unusual weakness or numbness. Feel faint. Have severe pain in your chest or abdomen. Vomit repeatedly. Have trouble breathing. These symptoms may be an emergency. Get help right away. Call 911. Do not wait to see if the symptoms will go away. Do not drive yourself to the hospital. Summary Hypertension is when the force of blood pumping through your arteries is too strong. If this condition is not controlled, it may put you at risk for serious complications. Your personal target blood pressure may vary depending on your medical conditions, your age, and other factors. For most people, a normal blood pressure is less than 120/80. Hypertension is treated with lifestyle changes, medicines, or a combination of both. Lifestyle changes include losing weight, eating a healthy,   low-sodium diet, exercising more, and limiting alcohol. This information is not intended to replace advice given to you by your health care provider. Make sure you discuss any questions you have with your health care provider. Document Revised: 08/22/2021 Document Reviewed: 08/22/2021 Elsevier Patient Education  2023 [employer]  

## 2022-09-25 NOTE — Progress Notes (Unsigned)
Subjective:  Patient ID: Sarah Watts, female    DOB: [birth year]  Age: 56 y.o. MRN: [id number]  CC: Abdominal Pain, Hypertension, and Diabetes   HPI MELISSIA LAHMAN presents for ***  Outpatient Medications Prior to Visit  Medication Sig Dispense Refill   albuterol (VENTOLIN HFA) 108 (90 Base) MCG/ACT inhaler Inhale 2 puffs into the lungs every 6 (six) hours as needed for wheezing or shortness of breath. 18 g 5   Budeson-Glycopyrrol-Formoterol (BREZTRI AEROSPHERE) 160-9-4.8 MCG/ACT AERO Inhale 2 puffs into the lungs 2 (two) times daily. 32.1 g 1   Esomeprazole Magnesium (NEXIUM PO) Take 1 tablet by mouth daily.     fluticasone (FLONASE) 50 MCG/ACT nasal spray Use 2 spray(s) in each nostril once daily 48 g 1   irbesartan (AVAPRO) 300 MG tablet Take 1 tablet by mouth once daily 90 tablet 0   levocetirizine (XYZAL) 5 MG tablet Take 1 tablet (5 mg total) by mouth every evening. 90 tablet 1   levothyroxine (SYNTHROID) 75 MCG tablet Take 1 tablet by mouth once daily 90 tablet 0   spironolactone (ALDACTONE) 50 MG tablet Take 1 tablet (50 mg total) by mouth daily. 90 tablet 0   No facility-administered medications prior to visit.    ROS Review of Systems  Constitutional: Negative.   HENT: Negative.    Eyes: Negative.   Respiratory: Negative.  Negative for cough, chest tightness and wheezing.   Cardiovascular:  Negative for chest pain, palpitations and leg swelling.  Gastrointestinal:  Positive for abdominal pain. Negative for diarrhea, nausea and vomiting.  Endocrine: Negative.   Genitourinary: Negative.  Negative for difficulty urinating.  Musculoskeletal: Negative.  Negative for arthralgias and myalgias.  Skin: Negative.   Neurological: Negative.   Hematological:  Negative for adenopathy. Does not bruise/bleed easily.  Psychiatric/Behavioral: Negative.      Objective:  BP (!) 154/96 (BP Location: Right Arm, Patient Position: Sitting, Cuff Size: Large)   Pulse 94   Temp 97.7  F (36.5 C) (Oral)   Ht 5' (1.524 m)   Wt 180 lb (81.6 kg)   SpO2 94%   BMI 35.15 kg/m   BP Readings from Last 3 Encounters:  09/25/22 (!) 154/96  09/04/22 (!) 178/92  08/06/22 124/72    Wt Readings from Last 3 Encounters:  09/25/22 180 lb (81.6 kg)  09/04/22 181 lb (82.1 kg)  08/06/22 182 lb 3.2 oz (82.6 kg)    Physical Exam Vitals reviewed. Exam conducted with a chaperone present (Shirron).  HENT:     Mouth/Throat:     Mouth: Mucous membranes are moist.  Eyes:     General: No scleral icterus.    Conjunctiva/sclera: Conjunctivae normal.  Cardiovascular:     Rate and Rhythm: Normal rate and regular rhythm.     Heart sounds: No murmur heard. Pulmonary:     Effort: Pulmonary effort is normal.     Breath sounds: No wheezing, rhonchi or rales.  Chest:     Chest wall: No mass, deformity, swelling, tenderness or edema.  Breasts:    Breasts are symmetrical.     Right: Normal. No swelling, bleeding, inverted nipple, mass, nipple discharge, skin change or tenderness.     Left: Normal. No swelling, bleeding, inverted nipple, mass, nipple discharge, skin change or tenderness.  Abdominal:     General: Abdomen is protuberant. There is no distension.     Palpations: There is no mass.     Tenderness: There is abdominal tenderness in the periumbilical area and  left upper quadrant. There is no guarding.  Musculoskeletal:        General: Normal range of motion.     Cervical back: Neck supple.     Right lower leg: No edema.     Left lower leg: No edema.  Lymphadenopathy:     Cervical: No cervical adenopathy.     Upper Body:     Right upper body: No supraclavicular, axillary or pectoral adenopathy.     Left upper body: No supraclavicular, axillary or pectoral adenopathy.  Skin:    General: Skin is warm and dry.     Findings: No rash.  Neurological:     General: No focal deficit present.     Mental Status: She is alert.  Psychiatric:        Mood and Affect: Mood normal.         Behavior: Behavior normal.     Lab Results  Component Value Date   WBC 10.4 09/04/2022   HGB 13.4 09/04/2022   HCT 40.8 09/04/2022   PLT 355.0 09/04/2022   GLUCOSE 104 (H) 09/04/2022   CHOL 195 04/10/2022   TRIG 191.0 (H) 04/10/2022   HDL 42.40 04/10/2022   LDLDIRECT 82.0 08/28/2018   LDLCALC 114 (H) 04/10/2022   ALT 21 07/13/2022   AST 18 07/13/2022   NA 140 09/04/2022   K 3.7 09/04/2022   CL 101 09/04/2022   CREATININE 0.94 09/04/2022   BUN 15 09/04/2022   CO2 31 09/04/2022   TSH 2.13 09/04/2022   HGBA1C 6.6 (H) 09/04/2022    CT CHEST LUNG CA SCREEN LOW DOSE W/O CM  Result Date: 06/27/2022 CLINICAL DATA:  Former smoker with 30 pack-year history EXAM: CT CHEST WITHOUT CONTRAST LOW-DOSE FOR LUNG CANCER SCREENING TECHNIQUE: Multidetector CT imaging of the chest was performed following the standard protocol without IV contrast. RADIATION DOSE REDUCTION: This exam was performed according to the departmental dose-optimization program which includes automated exposure control, adjustment of the mA and/or kV according to patient size and/or use of iterative reconstruction technique. COMPARISON:  Lung cancer screening CT dated June 27, 2021 FINDINGS: Cardiovascular: Normal heart size. No pericardial effusion. Normal caliber thoracic aorta with mild calcified plaque. No significant coronary artery calcifications. Mediastinum/Nodes: Esophagus and thyroid are unremarkable. No pathologically enlarged lymph nodes seen in the chest. Lungs/Pleura: Central airways are patent. Mild centrilobular emphysema. No consolidation, pleural effusion or pneumothorax. Upper Abdomen: No acute abnormality. Musculoskeletal: No chest wall mass or suspicious bone lesions identified. IMPRESSION: 1. Lung-RADS 1, negative. Continue annual screening with low-dose chest CT without contrast in 12 months. 2. Aortic Atherosclerosis (ICD10-I70.0) and Emphysema (ICD10-J43.9). Electronically Signed   By: Yetta Glassman M.D.    On: 06/27/2022 15:04   Assessment & Plan:   Machel was seen today for abdominal pain, hypertension and diabetes.  Diagnoses and all orders for this visit:  Hyperaldosteronism ([place]) -     MR Abdomen W Wo Contrast; Future  Left upper quadrant abdominal pain -     MR Abdomen W Wo Contrast; Future  Type II diabetes mellitus with manifestations ([place]) -     HM Diabetes Foot Exam -     Ambulatory referral to Ophthalmology  Visit for screening mammogram -     MM DIGITAL SCREENING BILATERAL; Future   I am having Sarah Watts maintain her fluticasone, levocetirizine, Breztri Aerosphere, Esomeprazole Magnesium (NEXIUM PO), irbesartan, albuterol, levothyroxine, and spironolactone.  No orders of the defined types were placed in this encounter.  Follow-up: Return in about 3 months (around 12/26/2022).  Scarlette Calico, MD

## 2022-09-28 ENCOUNTER — Telehealth: Payer: Self-pay | Admitting: Internal Medicine

## 2022-09-28 NOTE — Telephone Encounter (Signed)
Patient has called and has been told she cannot get a regular mammogram due to her having pain under her left armpit. Can her referral be changed from a regular mammogram to a diagnostic.

## 2022-09-30 ENCOUNTER — Other Ambulatory Visit: Payer: Self-pay | Admitting: Internal Medicine

## 2022-09-30 DIAGNOSIS — E039 Hypothyroidism, unspecified: Secondary | ICD-10-CM

## 2022-10-01 ENCOUNTER — Other Ambulatory Visit: Payer: Self-pay | Admitting: Internal Medicine

## 2022-10-01 DIAGNOSIS — N644 Mastodynia: Secondary | ICD-10-CM

## 2022-10-01 NOTE — Addendum Note (Signed)
Addended by: Janith Lima on: 10/01/2022 08:03 AM   Modules accepted: Orders

## 2022-10-09 ENCOUNTER — Other Ambulatory Visit: Payer: Self-pay | Admitting: Internal Medicine

## 2022-10-09 DIAGNOSIS — I10 Essential (primary) hypertension: Secondary | ICD-10-CM

## 2022-10-10 ENCOUNTER — Other Ambulatory Visit: Payer: Self-pay | Admitting: Internal Medicine

## 2022-10-10 DIAGNOSIS — J301 Allergic rhinitis due to pollen: Secondary | ICD-10-CM

## 2022-10-13 ENCOUNTER — Other Ambulatory Visit: Payer: BC Managed Care – PPO

## 2022-11-03 ENCOUNTER — Ambulatory Visit
Admission: RE | Admit: 2022-11-03 | Discharge: 2022-11-03 | Disposition: A | Discharge status: Home / Self Care | Payer: BC Managed Care – PPO | Source: Ambulatory Visit | Attending: Internal Medicine | Admitting: Internal Medicine

## 2022-11-03 DIAGNOSIS — K760 Fatty (change of) liver, not elsewhere classified: Secondary | ICD-10-CM | POA: Diagnosis not present

## 2022-11-03 DIAGNOSIS — E269 Hyperaldosteronism, unspecified: Secondary | ICD-10-CM | POA: Diagnosis not present

## 2022-11-03 DIAGNOSIS — R1012 Left upper quadrant pain: Secondary | ICD-10-CM

## 2022-11-03 MED ORDER — GADOPICLENOL 0.5 MMOL/ML IV SOLN
8.0000 mL | Freq: Once | INTRAVENOUS | Status: AC | PRN
  Administered 2022-11-03: 8 mL via INTRAVENOUS

## 2022-11-06 ENCOUNTER — Telehealth: Payer: BC Managed Care – PPO | Admitting: Physician Assistant

## 2022-11-06 DIAGNOSIS — J111 Influenza due to unidentified influenza virus with other respiratory manifestations: Secondary | ICD-10-CM | POA: Diagnosis not present

## 2022-11-06 DIAGNOSIS — R6889 Other general symptoms and signs: Secondary | ICD-10-CM

## 2022-11-06 MED ORDER — BENZONATATE 100 MG PO CAPS: 100.0000 mg | ORAL_CAPSULE | Freq: Three times a day (TID) | ORAL | 0 refills | Status: DC | PRN

## 2022-11-06 MED ORDER — PREDNISONE 20 MG PO TABS: 40.0000 mg | ORAL_TABLET | Freq: Every day | ORAL | 0 refills | Status: DC

## 2022-11-06 MED ORDER — ONDANSETRON 4 MG PO TBDP: 4.0000 mg | ORAL_TABLET | Freq: Three times a day (TID) | ORAL | 0 refills | Status: DC | PRN

## 2022-11-06 MED ORDER — OSELTAMIVIR PHOSPHATE 75 MG PO CAPS: 75.0000 mg | ORAL_CAPSULE | Freq: Two times a day (BID) | ORAL | 0 refills | Status: DC

## 2022-11-06 MED ORDER — PROMETHAZINE-DM 6.25-15 MG/5ML PO SYRP: 5.0000 mL | ORAL_SOLUTION | Freq: Four times a day (QID) | ORAL | 0 refills | Status: DC | PRN

## 2022-11-06 NOTE — Progress Notes (Signed)
Virtual Visit Consent   Sarah Watts, you are scheduled for a virtual visit with a [facility] provider today. Just as with appointments in the office, your consent must be obtained to participate. Your consent will be active for this visit and any virtual visit you may have with one of our providers in the next 365 days. If you have a MyChart account, a copy of this consent can be sent to you electronically.  As this is a virtual visit, video technology does not allow for your provider to perform a traditional examination. This may limit your provider's ability to fully assess your condition. If your provider identifies any concerns that need to be evaluated in person or the need to arrange testing (such as labs, EKG, etc.), we will make arrangements to do so. Although advances in technology are sophisticated, we cannot ensure that it will always work on either your end or our end. If the connection with a video visit is poor, the visit may have to be switched to a telephone visit. With either a video or telephone visit, we are not always able to ensure that we have a secure connection.  By engaging in this virtual visit, you consent to the provision of healthcare and authorize for your insurance to be billed (if applicable) for the services provided during this visit. Depending on your insurance coverage, you may receive a charge related to this service.  I need to obtain your verbal consent now. Are you willing to proceed with your visit today? Sarah Watts has provided verbal consent on 11/06/2022 for a virtual visit (video or telephone). Mar Daring, PA-C  Date: 11/06/2022 6:15 PM  Virtual Visit via Video Note   I, Mar Daring, connected with  Sarah Watts  ([id number], 1966-09-23) on 11/06/22 at  6:15 PM EST by a video-enabled telemedicine application and verified that I am speaking with the correct person using two identifiers.  Location: Patient: Virtual Visit  Location Patient: Home Provider: Virtual Visit Location Provider: Home Office   I discussed the limitations of evaluation and management by telemedicine and the availability of in person appointments. The patient expressed understanding and agreed to proceed.    History of Present Illness: Sarah Watts is a 57 y.o. who identifies as a female who was assigned female at birth, and is being seen today for flu-like symptoms.  HPI: URI  This is a new problem. The current episode started yesterday. The problem has been gradually worsening. The maximum temperature recorded prior to her arrival was 100.4 - 100.9 F. The fever has been present for Less than 1 day. Associated symptoms include congestion, coughing, diarrhea, ear pain (left), headaches, nausea, a plugged ear sensation, rhinorrhea, sinus pain and a sore throat. Pertinent negatives include no vomiting. Associated symptoms comments: Body aches. She has tried nothing for the symptoms. The treatment provided no relief.  Had flu exposure at work and at home, wife has similar symptoms   Problems:  Patient Active Problem List   Diagnosis Date Noted   Type II diabetes mellitus with manifestations ([place]) 09/05/2022   Left upper quadrant abdominal pain 09/04/2022   Hyperaldosteronism ([place]) 04/29/2022   Simple chronic bronchitis ([place]) 12/20/2020   Centrilobular emphysema ([place]) 06/14/2020   Encounter for screening for lung cancer 06/08/2020   Chronic renal disease, stage 2, mildly decreased glomerular filtration rate (GFR) between 60-89 mL/min/1.73 square meter 04/09/2020   Colon cancer screening 10/08/2019   Vitamin D deficiency disease 10/08/2019  B12 nutritional deficiency 04/04/2017   Current severe episode of major depressive disorder without psychotic features without prior episode ([place]) 03/18/2017   Routine general medical examination at a health care facility 03/12/2016   Tobacco abuse 12/01/2015   Visit for screening mammogram  02/13/2013   Spinal stenosis in cervical region 02/13/2013   Hypothyroid 01/18/2011   OBSTRUCTIVE SLEEP APNEA 01/09/2011   Essential hypertension 12/19/2010   Allergic rhinitis 12/19/2010    Allergies:  Allergies  Allergen Reactions   Shellfish Allergy Nausea And Vomiting   Codeine     REACTION: tachycardia   Tramadol     Nausea and vomiting   Medications:  Current Outpatient Medications:    benzonatate (TESSALON) 100 MG capsule, Take 1 capsule (100 mg total) by mouth 3 (three) times daily as needed., Disp: 30 capsule, Rfl: 0   ondansetron (ZOFRAN-ODT) 4 MG disintegrating tablet, Take 1 tablet (4 mg total) by mouth every 8 (eight) hours as needed., Disp: 20 tablet, Rfl: 0   oseltamivir (TAMIFLU) 75 MG capsule, Take 1 capsule (75 mg total) by mouth 2 (two) times daily., Disp: 10 capsule, Rfl: 0   predniSONE (DELTASONE) 20 MG tablet, Take 2 tablets (40 mg total) by mouth daily with breakfast., Disp: 10 tablet, Rfl: 0   promethazine-dextromethorphan (PROMETHAZINE-DM) 6.25-15 MG/5ML syrup, Take 5 mLs by mouth 4 (four) times daily as needed., Disp: 118 mL, Rfl: 0   albuterol (VENTOLIN HFA) 108 (90 Base) MCG/ACT inhaler, Inhale 2 puffs into the lungs every 6 (six) hours as needed for wheezing or shortness of breath., Disp: 18 g, Rfl: 5   Budeson-Glycopyrrol-Formoterol (BREZTRI AEROSPHERE) 160-9-4.8 MCG/ACT AERO, Inhale 2 puffs into the lungs 2 (two) times daily., Disp: 32.1 g, Rfl: 1   Esomeprazole Magnesium (NEXIUM PO), Take 1 tablet by mouth daily., Disp: , Rfl:    fluticasone (FLONASE) 50 MCG/ACT nasal spray, Use 2 spray(s) in each nostril once daily, Disp: 48 g, Rfl: 1   irbesartan (AVAPRO) 300 MG tablet, Take 1 tablet by mouth once daily, Disp: 90 tablet, Rfl: 0   levocetirizine (XYZAL) 5 MG tablet, TAKE 1 TABLET BY MOUTH ONCE DAILY IN THE EVENING, Disp: 90 tablet, Rfl: 0   levothyroxine (SYNTHROID) 75 MCG tablet, Take 1 tablet by mouth once daily, Disp: 90 tablet, Rfl: 0    spironolactone (ALDACTONE) 50 MG tablet, Take 1 tablet (50 mg total) by mouth daily., Disp: 90 tablet, Rfl: 0  Observations/Objective: Patient is well-developed, well-nourished in no acute distress.  Resting comfortably at home.  Head is normocephalic, atraumatic.  No labored breathing.  Speech is clear and coherent with logical content.  Patient is alert and oriented at baseline.    Assessment and Plan: 1. Influenza - oseltamivir (TAMIFLU) 75 MG capsule; Take 1 capsule (75 mg total) by mouth 2 (two) times daily.  Dispense: 10 capsule; Refill: 0 - ondansetron (ZOFRAN-ODT) 4 MG disintegrating tablet; Take 1 tablet (4 mg total) by mouth every 8 (eight) hours as needed.  Dispense: 20 tablet; Refill: 0 - predniSONE (DELTASONE) 20 MG tablet; Take 2 tablets (40 mg total) by mouth daily with breakfast.  Dispense: 10 tablet; Refill: 0 - promethazine-dextromethorphan (PROMETHAZINE-DM) 6.25-15 MG/5ML syrup; Take 5 mLs by mouth 4 (four) times daily as needed.  Dispense: 118 mL; Refill: 0 - benzonatate (TESSALON) 100 MG capsule; Take 1 capsule (100 mg total) by mouth 3 (three) times daily as needed.  Dispense: 30 capsule; Refill: 0  - Suspect influenza due to symptoms and positive exposure - Tamiflu prescribed -  Tessalon perles and Promethazine DM for cough - Prednisone for COPD - Zofran for nausea - Continue OTC medication of choice for symptomatic management - Push fluids - Rest - Seek in person evaluation if symptoms worsen or fail to improve   Follow Up Instructions: I discussed the assessment and treatment plan with the patient. The patient was provided an opportunity to ask questions and all were answered. The patient agreed with the plan and demonstrated an understanding of the instructions.  A copy of instructions were sent to the patient via MyChart unless otherwise noted below.    The patient was advised to call back or seek an in-person evaluation if the symptoms worsen or if the  condition fails to improve as anticipated.  Time:  I spent 10 minutes with the patient via telehealth technology discussing the above problems/concerns.    Mar Daring, PA-C

## 2022-11-06 NOTE — Progress Notes (Signed)
   Thank you for the details you included in the comment boxes. Those details are very helpful in determining the best course of treatment for you and help [place] to provide the best care.Because your are experiencing shortness of breath and chest pain, we recommend that you convert this visit to a video visit in order for the provider to better assess what is going on.  The provider will be able to give you a more accurate diagnosis and treatment plan if we can more freely discuss your symptoms and with the addition of a virtual examination.   If you convert to a video visit, we will bill your insurance (similar to an office visit) and you will not be charged for this e-Visit. You will be able to stay at home and speak with the first available [facility] advanced practice provider. The link to do a video visit is in the drop down Menu tab of your Welcome screen in [place].   Lyndon Code, PA-C

## 2022-11-06 NOTE — Patient Instructions (Signed)
Sarah Watts, thank you for joining Mar Daring, PA-C for today's virtual visit.  While this provider is not your primary care provider (PCP), if your PCP is located in our provider database this encounter information will be shared with them immediately following your visit.   [facility] account gives you access to today's visit and all your visits, tests, and labs performed at [facility] " click here if you don't have a [facility] account or go to [email or web address]  Consent: (Patient) Sarah Watts provided verbal consent for this virtual visit at the beginning of the encounter.  Current Medications:  Current Outpatient Medications:    benzonatate (TESSALON) 100 MG capsule, Take 1 capsule (100 mg total) by mouth 3 (three) times daily as needed., Disp: 30 capsule, Rfl: 0   ondansetron (ZOFRAN-ODT) 4 MG disintegrating tablet, Take 1 tablet (4 mg total) by mouth every 8 (eight) hours as needed., Disp: 20 tablet, Rfl: 0   oseltamivir (TAMIFLU) 75 MG capsule, Take 1 capsule (75 mg total) by mouth 2 (two) times daily., Disp: 10 capsule, Rfl: 0   predniSONE (DELTASONE) 20 MG tablet, Take 2 tablets (40 mg total) by mouth daily with breakfast., Disp: 10 tablet, Rfl: 0   promethazine-dextromethorphan (PROMETHAZINE-DM) 6.25-15 MG/5ML syrup, Take 5 mLs by mouth 4 (four) times daily as needed., Disp: 118 mL, Rfl: 0   albuterol (VENTOLIN HFA) 108 (90 Base) MCG/ACT inhaler, Inhale 2 puffs into the lungs every 6 (six) hours as needed for wheezing or shortness of breath., Disp: 18 g, Rfl: 5   Budeson-Glycopyrrol-Formoterol (BREZTRI AEROSPHERE) 160-9-4.8 MCG/ACT AERO, Inhale 2 puffs into the lungs 2 (two) times daily., Disp: 32.1 g, Rfl: 1   Esomeprazole Magnesium (NEXIUM PO), Take 1 tablet by mouth daily., Disp: , Rfl:    fluticasone (FLONASE) 50 MCG/ACT nasal spray, Use 2 spray(s) in each nostril once daily, Disp: 48 g, Rfl: 1   irbesartan (AVAPRO) 300  MG tablet, Take 1 tablet by mouth once daily, Disp: 90 tablet, Rfl: 0   levocetirizine (XYZAL) 5 MG tablet, TAKE 1 TABLET BY MOUTH ONCE DAILY IN THE EVENING, Disp: 90 tablet, Rfl: 0   levothyroxine (SYNTHROID) 75 MCG tablet, Take 1 tablet by mouth once daily, Disp: 90 tablet, Rfl: 0   spironolactone (ALDACTONE) 50 MG tablet, Take 1 tablet (50 mg total) by mouth daily., Disp: 90 tablet, Rfl: 0   Medications ordered in this encounter:  Meds ordered this encounter  Medications   oseltamivir (TAMIFLU) 75 MG capsule    Sig: Take 1 capsule (75 mg total) by mouth 2 (two) times daily.    Dispense:  10 capsule    Refill:  0    Order Specific Question:   Supervising Provider    Answer:   LAMPTEY, PHILIP O [[id number]]   ondansetron (ZOFRAN-ODT) 4 MG disintegrating tablet    Sig: Take 1 tablet (4 mg total) by mouth every 8 (eight) hours as needed.    Dispense:  20 tablet    Refill:  0    Order Specific Question:   Supervising Provider    Answer:   Chase Picket [[id number]]   predniSONE (DELTASONE) 20 MG tablet    Sig: Take 2 tablets (40 mg total) by mouth daily with breakfast.    Dispense:  10 tablet    Refill:  0    Order Specific Question:   Supervising Provider    Answer:   Chase Picket [id number]   promethazine-dextromethorphan (PROMETHAZINE-DM)  6.25-15 MG/5ML syrup    Sig: Take 5 mLs by mouth 4 (four) times daily as needed.    Dispense:  118 mL    Refill:  0    Order Specific Question:   Supervising Provider    Answer:   Chase Picket [[id number]]   benzonatate (TESSALON) 100 MG capsule    Sig: Take 1 capsule (100 mg total) by mouth 3 (three) times daily as needed.    Dispense:  30 capsule    Refill:  0    Order Specific Question:   Supervising Provider    Answer:   Chase Picket [id number]     *If you need refills on other medications prior to your next appointment, please contact your pharmacy*  Follow-Up: Call back or seek an in-person evaluation if the symptoms worsen  or if the condition fails to improve as anticipated.  [facility] [phone]  Other Instructions  Influenza, Adult Influenza, also called "the flu," is a viral infection that mainly affects the respiratory tract. This includes the lungs, nose, and throat. The flu spreads easily from person to person (is contagious). It causes common cold symptoms, along with high fever and body aches. What are the causes? This condition is caused by the influenza virus. You can get the virus by: Breathing in droplets that are in the air from an infected person's cough or sneeze. Touching something that has the virus on it (has been contaminated) and then touching your mouth, nose, or eyes. What increases the risk? The following factors may make you more likely to get the flu: Not washing or sanitizing your hands often. Having close contact with many people during cold and flu season. Touching your mouth, eyes, or nose without first washing or sanitizing your hands. Not getting an annual flu shot. You may have a higher risk for the flu, including serious problems, such as a lung infection (pneumonia), if you: Are older than 65. Are pregnant. Have a weakened disease-fighting system (immune system). This includes people who have HIV or AIDS, are on chemotherapy, or are taking medicines that reduce (suppress) the immune system. Have a long-term (chronic) illness, such as heart disease, kidney disease, diabetes, or lung disease. Have a liver disorder. Are severely overweight (morbidly obese). Have anemia. Have asthma. What are the signs or symptoms? Symptoms of this condition usually begin suddenly and last 4-14 days. These may include: Fever and chills. Headaches, body aches, or muscle aches. Sore throat. Cough. Runny or stuffy (congested) nose. Chest discomfort. Poor appetite. Weakness or fatigue. Dizziness. Nausea or vomiting. How is this diagnosed? This condition may be  diagnosed based on: Your symptoms and medical history. A physical exam. Swabbing your nose or throat and testing the fluid for the influenza virus. How is this treated? If the flu is diagnosed early, you can be treated with antiviral medicine that is given by mouth (orally) or through an IV. This can help reduce how severe the illness is and how long it lasts. Taking care of yourself at home can help relieve symptoms. Your health care provider may recommend: Taking over-the-counter medicines. Drinking plenty of fluids. In many cases, the flu goes away on its own. If you have severe symptoms or complications, you may be treated in a hospital. Follow these instructions at home: Activity Rest as needed and get plenty of sleep. Stay home from work or school as told by your health care provider. Unless you are visiting your health care provider,  avoid leaving home until your fever has been gone for 24 hours without taking medicine. Eating and drinking Take an oral rehydration solution (ORS). This is a drink that is sold at pharmacies and retail stores. Drink enough fluid to keep your urine pale yellow. Drink clear fluids in small amounts as you are able. Clear fluids include water, ice chips, fruit juice mixed with water, and low-calorie sports drinks. Eat bland, easy-to-digest foods in small amounts as you are able. These foods include bananas, applesauce, rice, lean meats, toast, and crackers. Avoid drinking fluids that contain a lot of sugar or caffeine, such as energy drinks, regular sports drinks, and soda. Avoid alcohol. Avoid spicy or fatty foods. General instructions     Take over-the-counter and prescription medicines only as told by your health care provider. Use a cool mist humidifier to add humidity to the air in your home. This can make it easier to breathe. When using a cool mist humidifier, clean it daily. Empty the water and replace it with clean water. Cover your mouth and  nose when you cough or sneeze. Wash your hands with soap and water often and for at least 20 seconds, especially after you cough or sneeze. If soap and water are not available, use alcohol-based hand sanitizer. Keep all follow-up visits. This is important. How is this prevented?  Get an annual flu shot. This is usually available in late summer, fall, or winter. Ask your health care provider when you should get your flu shot. Avoid contact with people who are sick during cold and flu season. This is generally fall and winter. Contact a health care provider if: You develop new symptoms. You have: Chest pain. Diarrhea. A fever. Your cough gets worse. You produce more mucus. You feel nauseous or you vomit. Get help right away if you: Develop shortness of breath or have difficulty breathing. Have skin or nails that turn a bluish color. Have severe pain or stiffness in your neck. Develop a sudden headache or sudden pain in your face or ear. Cannot eat or drink without vomiting. These symptoms may represent a serious problem that is an emergency. Do not wait to see if the symptoms will go away. Get medical help right away. Call your local emergency services (911 in the [place] Do not drive yourself to the hospital. Summary Influenza, also called "the flu," is a viral infection that primarily affects your respiratory tract. Symptoms of the flu usually begin suddenly and last 4-14 days. Getting an annual flu shot is the best way to prevent getting the flu. Stay home from work or school as told by your health care provider. Unless you are visiting your health care provider, avoid leaving home until your fever has been gone for 24 hours without taking medicine. Keep all follow-up visits. This is important. This information is not intended to replace advice given to you by your health care provider. Make sure you discuss any questions you have with your health care provider. Document Revised:  06/03/2020 Document Reviewed: 06/03/2020 Elsevier Patient Education  [place].    If you have been instructed to have an in-person evaluation today at a local Urgent Care facility, please use the link below. It will take you to a list of all of our available [facility], including address, phone number and hours of operation. Please do not delay care.  [facility]  If you or a family member do not have a primary care provider, use  the link below to schedule a visit and establish care. When you choose a [facility] primary care physician or advanced practice provider, you gain a long-term partner in health. Find a Primary Care Provider  Learn more about 's in-office and virtual care options: [facility]

## 2022-11-23 ENCOUNTER — Ambulatory Visit
Admission: RE | Admit: 2022-11-23 | Discharge: 2022-11-23 | Disposition: A | Discharge status: Home / Self Care | Payer: BC Managed Care – PPO | Source: Ambulatory Visit | Attending: Internal Medicine | Admitting: Internal Medicine

## 2022-11-23 ENCOUNTER — Ambulatory Visit: Payer: BC Managed Care – PPO

## 2022-11-23 DIAGNOSIS — N644 Mastodynia: Secondary | ICD-10-CM | POA: Diagnosis not present

## 2022-11-28 ENCOUNTER — Other Ambulatory Visit: Payer: Self-pay | Admitting: Internal Medicine

## 2022-11-28 DIAGNOSIS — E269 Hyperaldosteronism, unspecified: Secondary | ICD-10-CM

## 2022-11-28 DIAGNOSIS — I10 Essential (primary) hypertension: Secondary | ICD-10-CM

## 2022-12-04 DIAGNOSIS — E349 Endocrine disorder, unspecified: Secondary | ICD-10-CM | POA: Diagnosis not present

## 2022-12-04 DIAGNOSIS — E039 Hypothyroidism, unspecified: Secondary | ICD-10-CM | POA: Diagnosis not present

## 2022-12-07 ENCOUNTER — Ambulatory Visit: Payer: BC Managed Care – PPO | Admitting: "Endocrinology

## 2022-12-12 LAB — COMPREHENSIVE METABOLIC PANEL
ALT: 16 IU/L (ref 0–32)
AST: 16 IU/L (ref 0–40)
Albumin/Globulin Ratio: 1.8 (ref 1.2–2.2)
Albumin: 4.6 g/dL (ref 3.8–4.9)
Alkaline Phosphatase: 91 IU/L (ref 44–121)
BUN/Creatinine Ratio: 15 (ref 9–23)
BUN: 15 mg/dL (ref 6–24)
Bilirubin Total: 0.5 mg/dL (ref 0.0–1.2)
CO2: 25 mmol/L (ref 20–29)
Calcium: 10.7 mg/dL — ABNORMAL HIGH (ref 8.7–10.2)
Chloride: 101 mmol/L (ref 96–106)
Creatinine, Ser: 1 mg/dL (ref 0.57–1.00)
Globulin, Total: 2.5 g/dL (ref 1.5–4.5)
Glucose: 119 mg/dL — ABNORMAL HIGH (ref 70–99)
Potassium: 5.2 mmol/L (ref 3.5–5.2)
Sodium: 140 mmol/L (ref 134–144)
Total Protein: 7.1 g/dL (ref 6.0–8.5)
eGFR: 66 mL/min/{1.73_m2} (ref >59)

## 2022-12-12 LAB — ALDOSTERONE + RENIN ACTIVITY W/ RATIO
Aldos/Renin Ratio: 1.4 (ref 0.0–30.0)
Aldosterone: 30.7 ng/dL — ABNORMAL HIGH (ref 0.0–30.0)
Renin Activity, Plasma: 22.653 ng/mL/hr — ABNORMAL HIGH (ref 0.167–5.380)

## 2022-12-12 LAB — T4, FREE: Free T4: 1.44 ng/dL (ref 0.82–1.77)

## 2022-12-12 LAB — TSH: TSH: 3.95 u[IU]/mL (ref 0.450–4.500)

## 2022-12-25 ENCOUNTER — Ambulatory Visit (INDEPENDENT_AMBULATORY_CARE_PROVIDER_SITE_OTHER): Payer: BC Managed Care – PPO | Admitting: "Endocrinology

## 2022-12-25 ENCOUNTER — Encounter: Payer: Self-pay | Admitting: "Endocrinology

## 2022-12-25 VITALS — BP 144/76 | HR 68 | Ht 60.0 in | Wt 183.6 lb

## 2022-12-25 DIAGNOSIS — E269 Hyperaldosteronism, unspecified: Secondary | ICD-10-CM | POA: Diagnosis not present

## 2022-12-25 DIAGNOSIS — I10 Essential (primary) hypertension: Secondary | ICD-10-CM

## 2022-12-25 DIAGNOSIS — E039 Hypothyroidism, unspecified: Secondary | ICD-10-CM | POA: Diagnosis not present

## 2022-12-25 MED ORDER — SPIRONOLACTONE 100 MG PO TABS: 100.0000 mg | ORAL_TABLET | Freq: Every day | ORAL | 1 refills | Status: DC

## 2022-12-25 NOTE — Progress Notes (Signed)
12/25/2022, 5:44 PM  Endocrinology follow-up note   Subjective:    Patient ID: Sarah Watts, female    DOB: 05-10-1966, PCP Janith Lima, MD   Past Medical History:  Diagnosis Date   Allergic rhinitis    Heart murmur    HTN (hypertension)    Irregular heartbeat    Past Surgical History:  Procedure Laterality Date   APPENDECTOMY     CESAREAN SECTION     CHOLECYSTECTOMY     FACIAL RECONSTRUCTION SURGERY     Left, due to birth defect   TONSILLECTOMY     Social History   Socioeconomic History   Marital status: Married    Spouse name: Not on file   Number of children: Y   Years of education: Not on file   Highest education level: Not on file  Occupational History   Occupation: Therapist, music: [id number]  Tobacco Use   Smoking status: Former    Packs/day: 0.50    Years: 15.00    Total pack years: 7.50    Types: Cigarettes    Quit date: 06/07/2020    Years since quitting: 2.5   Smokeless tobacco: Never  Vaping Use   Vaping Use: Former  Substance and Sexual Activity   Alcohol use: No   Drug use: No   Sexual activity: Yes    Comment: female partner  Other Topics Concern   Not on file  Social History Narrative   Domestic Partner - Female, Estate manager/land agent   Regular Exercise -  NO   Social Determinants of Health   Financial Resource Strain: Not on file  Food Insecurity: Not on file  Transportation Needs: Not on file  Physical Activity: Not on file  Stress: Not on file  Social Connections: Not on file   Family History  Problem Relation Age of Onset   Hypertension Mother    Allergies Mother    Breast cancer Mother        34s   Heart attack Mother    Stroke Mother    Prostate cancer Father        x2   Cancer Father    Stroke Father    Heart attack Father    Hypertension Father    Diabetes Father    Allergies Father    Heart disease Father    Allergies Sister    Non-Hodgkin's lymphoma  Sister        x2   Asthma Sister    Lung cancer Maternal Grandmother    Ovarian cancer Paternal Grandmother    Diabetes Other        1st degree relative   Hypertension Other    Prostate cancer Other        1st degree relative <50   Colon cancer Neg Hx    Outpatient Encounter Medications as of 12/25/2022  Medication Sig   albuterol (VENTOLIN HFA) 108 (90 Base) MCG/ACT inhaler Inhale 2 puffs into the lungs every 6 (six) hours as needed for wheezing or shortness of breath.   Budeson-Glycopyrrol-Formoterol (BREZTRI AEROSPHERE) 160-9-4.8 MCG/ACT AERO Inhale 2 puffs into the lungs 2 (two) times daily.   Esomeprazole Magnesium (NEXIUM PO) Take 1 tablet by mouth daily.  fluticasone (FLONASE) 50 MCG/ACT nasal spray Use 2 spray(s) in each nostril once daily   irbesartan (AVAPRO) 300 MG tablet Take 1 tablet by mouth once daily   levocetirizine (XYZAL) 5 MG tablet TAKE 1 TABLET BY MOUTH ONCE DAILY IN THE EVENING   levothyroxine (SYNTHROID) 75 MCG tablet Take 1 tablet by mouth once daily   ondansetron (ZOFRAN-ODT) 4 MG disintegrating tablet Take 1 tablet (4 mg total) by mouth every 8 (eight) hours as needed.   spironolactone (ALDACTONE) 100 MG tablet Take 1 tablet (100 mg total) by mouth daily.   [DISCONTINUED] benzonatate (TESSALON) 100 MG capsule Take 1 capsule (100 mg total) by mouth 3 (three) times daily as needed.   [DISCONTINUED] oseltamivir (TAMIFLU) 75 MG capsule Take 1 capsule (75 mg total) by mouth 2 (two) times daily.   [DISCONTINUED] predniSONE (DELTASONE) 20 MG tablet Take 2 tablets (40 mg total) by mouth daily with breakfast.   [DISCONTINUED] promethazine-dextromethorphan (PROMETHAZINE-DM) 6.25-15 MG/5ML syrup Take 5 mLs by mouth 4 (four) times daily as needed.   [DISCONTINUED] spironolactone (ALDACTONE) 50 MG tablet Take 1 tablet by mouth once daily   No facility-administered encounter medications on file as of 12/25/2022.   ALLERGIES: Allergies  Allergen Reactions   Shellfish  Allergy Nausea And Vomiting   Codeine     REACTION: tachycardia   Tramadol     Nausea and vomiting    VACCINATION STATUS: Immunization History  Administered Date(s) Administered   Hepatitis B 11/19/2017, 12/20/2017, 06/05/2018   Hepatitis B, ADULT 05/29/2018   Influenza Inj Mdck Quad With Preservative 08/23/2022   Influenza,inj,Quad PF,6+ Mos 11/12/2014, 08/05/2017, 08/28/2018   Influenza-Unspecified 08/08/2013, 10/29/2014, 08/28/2019   Moderna Sars-Covid-2 Vaccination 10/14/2020   PNEUMOCOCCAL CONJUGATE-20 03/09/2021   Pneumococcal Polysaccharide-23 06/17/2013, 11/09/2017   Tdap 02/13/2013   Zoster Recombinat (Shingrix) 04/10/2022    HPI Sarah Watts is 57 y.o. female who presents today with a medical history as above. she is being seen in consultation for abnormal PAC/PRA  ratio requested by Janith Lima, MD.   -A diagnosis of primary hyperaldosteronism was not made.  She is on expectant management .  She is known to have hypertension for the last 20 years.  She was treated with various combinations of medications not more than 2 medications at the time. More recently, she was observed to have hypokalemia which is resolved with potassium supplements.    Lab work in June 2023 revealed slight elevation of PAC/PRA.  No recent labs have been discordant.    She does not report any prior history of adrenal, thyroid, parathyroid, pituitary dysfunction.  Her recent MRI did not reveal any adrenal mass lesions.  Her blood pressure is optimally controlled at this time.   Her medications currently include irbesartan 300 mg p.o. daily, Dyazide triamterene-hydrochlorothiazide 37.5-25 mg p.o. daily.  She is also on spironolactone 50 mg p.o. daily. She has taken  this Dyazide for at least 5 years. She denies coronary artery disease, CVA, CKD. Her other medical problems include hypothyroidism, obstructive sleep apnea, prediabetes, hyperlipidemia. She is on levothyroxine 75 mcg p.o.  daily.     She is a former smoker with more than 20-pack-year history of smoking.   Review of Systems  Constitutional: + Minimally fluctuating body weight, + fatigue, no subjective hyperthermia, no subjective hypothermia Eyes: no blurry vision, no xerophthalmia   Objective:       12/25/2022    1:58 PM 09/25/2022    3:46 PM 09/04/2022    1:28 PM  Vitals with BMI  Height '5\' 0"'$  '5\' 0"'$  '5\' 0"'$   Weight 183 lbs 10 oz 180 lbs 181 lbs  BMI 35.86 [id number]  Systolic 123456 [id number] 0000000  Diastolic 76 96 92  Pulse 68 94 77    BP (!) 144/76 Comment: 136/72 R arm with manuel cuff  Pulse 68   Ht 5' (1.524 m)   Wt 183 lb 9.6 oz (83.3 kg)   BMI 35.86 kg/m   Wt Readings from Last 3 Encounters:  12/25/22 183 lb 9.6 oz (83.3 kg)  09/25/22 180 lb (81.6 kg)  09/04/22 181 lb (82.1 kg)    Physical Exam  Constitutional:  Body mass index is 35.86 kg/m.,  not in acute distress, normal state of mind Eyes: PERRLA, EOMI, no exophthalmos ENT: moist mucous membranes, no gross thyromegaly, no gross cervical lymphadenopathy   CMP ( most recent) CMP     Diabetic Labs (most recent): Lab Results  Component Value Date   HGBA1C 6.6 (H) 09/04/2022   HGBA1C 6.2 01/08/2022   HGBA1C 6.4 03/09/2021     Lipid Panel ( most recent) Lipid Panel     Component Value Date/Time   CHOL 195 04/10/2022 1506   TRIG 191.0 (H) 04/10/2022 1506   HDL 42.40 04/10/2022 1506   CHOLHDL 5 04/10/2022 1506   VLDL 38.2 04/10/2022 1506   LDLCALC 114 (H) 04/10/2022 1506   LDLDIRECT 82.0 08/28/2018 0932      Lab Results  Component Value Date   TSH 3.950 12/04/2022   TSH 2.13 09/04/2022   TSH 2.00 04/10/2022   TSH 6.20 (H) 01/08/2022   TSH 0.37 03/09/2021   TSH 1.46 04/07/2020   TSH 0.68 10/08/2019   TSH 0.77 02/26/2019   TSH 1.00 08/28/2018   TSH 1.27 04/17/2018   FREET4 1.44 12/04/2022     Latest Reference Range & Units 04/12/22 08:18 07/13/22 11:05 12/04/22 11:14  ALDOSTERONE 0.0 - 30.0 ng/dL 11 23.3  30.7 (H)  ALDO / PRA Ratio 0.9 - 28.9 Ratio 32.4 (H)    Renin 0.167 - 5.380 ng/mL/hr  39.189 (H) 22  ALDOS/RENIN RATIO 0.0 - 30.0   0.6 1.4  (H): Data is abnormally high    Recent Results (from the past 2160 hour(s))  Comprehensive metabolic panel     Status: Abnormal   Collection Time: 12/04/22 11:14 AM  Result Value Ref Range   Glucose 119 (H) 70 - 99 mg/dL   BUN 15 6 - 24 mg/dL   Creatinine, Ser 1.00 0.57 - 1.00 mg/dL   eGFR 66 >59 mL/min/1.73   BUN/Creatinine Ratio 15 9 - 23   Sodium 140 134 - 144 mmol/L   Potassium 5.2 3.5 - 5.2 mmol/L   Chloride 101 96 - 106 mmol/L   CO2 25 20 - 29 mmol/L   Calcium 10.7 (H) 8.7 - 10.2 mg/dL   Total Protein 7.1 6.0 - 8.5 g/dL   Albumin 4.6 3.8 - 4.9 g/dL   Globulin, Total 2.5 1.5 - 4.5 g/dL   Albumin/Globulin Ratio 1.8 1.2 - 2.2   Bilirubin Total 0.5 0.0 - 1.2 mg/dL   Alkaline Phosphatase 91 44 - 121 IU/L   AST 16 0 - 40 IU/L   ALT 16 0 - 32 IU/L  Aldosterone + renin activity w/ ratio     Status: Abnormal   Collection Time: 12/04/22 11:14 AM  Result Value Ref Range   Aldosterone 30.7 (H) 0.0 - 30.0 ng/dL   Renin Activity, Plasma 22.653 (H) 0.167 - 5.380  ng/mL/hr   Aldos/Renin Ratio 1.4 0.0 - 30.0    Comment:                          Units:      ng/dL per ng/mL/hr  TSH     Status: None   Collection Time: 12/04/22 11:14 AM  Result Value Ref Range   TSH 3.950 0.450 - 4.500 uIU/mL  T4, free     Status: None   Collection Time: 12/04/22 11:14 AM  Result Value Ref Range   Free T4 1.44 0.82 - 1.77 ng/dL     Assessment & Plan:   1. Endocrine disorder, unspecified 2. Essential hypertension, benign 4. Hypothyroidism  I discussed her new and existing and new labs with her. I also discussed her MRI results which did not document adrenal lesions. - Based on these reviews, her repeat labs show marginal elevation of aldosterone, but do not confirm primary hyperaldo. May still have secondary hyperaldo. She will not need surgery referral  for now, but close followup.  She will be continued on ARB (irbesartan 300 mg) as well as HCTZ 25 mg p.o. daily at breakfast.  I have discussed and increased her spironolactone to '100mg'$  po qday. She is not on potassium anymore.   In light of her metabolic dysfunction indicated by prediabetes, hyperlipidemia, hypertension, obesity, sleep apnea this patient is a good fit for lifestyle medicine. She is advised to measure her blood pressure daily at home and report if they continue to be above 140/80.  For her hypothyroidism, she is advised to continue levothyroxine 75 mcg p.o. daily before breakfast.   - she is advised to maintain close follow up with Janith Lima, MD for primary care needs.   I spent  21 minutes in the care of the patient today including review of labs from Thyroid Function, CMP, and other relevant labs ; imaging/biopsy records (current and previous including abstractions from other facilities); face-to-face time discussing  her lab results and symptoms, medications doses, her options of short and long term treatment based on the latest standards of care / guidelines;   and documenting the encounter.  Sarah Watts  participated in the discussions, expressed understanding, and voiced agreement with the above plans.  All questions were answered to her satisfaction. she is encouraged to contact clinic should she have any questions or concerns prior to her return visit.    Follow up plan: Return in about 6 months (around 06/25/2023) for F/U with Pre-visit Labs.   Glade Lloyd, MD [facility] [address] [place], [place] [address] Phone: [phone]  Fax: [phone]     12/25/2022, 5:44 PM  This note was partially dictated with voice recognition software. Similar sounding words can be transcribed inadequately or may not  be corrected upon review.

## 2022-12-26 ENCOUNTER — Ambulatory Visit (INDEPENDENT_AMBULATORY_CARE_PROVIDER_SITE_OTHER): Payer: BC Managed Care – PPO | Admitting: Internal Medicine

## 2022-12-26 VITALS — BP 130/70 | HR 69 | Temp 98.2°F | Ht 60.0 in | Wt 183.0 lb

## 2022-12-26 DIAGNOSIS — E118 Type 2 diabetes mellitus with unspecified complications: Secondary | ICD-10-CM | POA: Diagnosis not present

## 2022-12-26 DIAGNOSIS — M4802 Spinal stenosis, cervical region: Secondary | ICD-10-CM | POA: Diagnosis not present

## 2022-12-26 DIAGNOSIS — N182 Chronic kidney disease, stage 2 (mild): Secondary | ICD-10-CM | POA: Diagnosis not present

## 2022-12-26 DIAGNOSIS — I10 Essential (primary) hypertension: Secondary | ICD-10-CM | POA: Diagnosis not present

## 2022-12-26 DIAGNOSIS — M542 Cervicalgia: Secondary | ICD-10-CM

## 2022-12-26 NOTE — Progress Notes (Signed)
Subjective:  Patient ID: Sarah Watts, female    DOB: November 25, 1965  Age: 57 y.o. MRN: [id number]  CC: Hypertension, Diabetes, and Hyperlipidemia   HPI Sarah Watts presents for f/up ----    She is active and denies chest pain, shortness of breath, or edema.  She complains of worsening pain in her neck that radiates into her arms with numbness in both upper extremities.  She also has worsening left shoulder pain.  She is controlling the pain with Advil.  Outpatient Medications Prior to Visit  Medication Sig Dispense Refill   albuterol (VENTOLIN HFA) 108 (90 Base) MCG/ACT inhaler Inhale 2 puffs into the lungs every 6 (six) hours as needed for wheezing or shortness of breath. 18 g 5   Budeson-Glycopyrrol-Formoterol (BREZTRI AEROSPHERE) 160-9-4.8 MCG/ACT AERO Inhale 2 puffs into the lungs 2 (two) times daily. 32.1 g 1   Esomeprazole Magnesium (NEXIUM PO) Take 1 tablet by mouth daily.     famotidine (PEPCID) 10 MG tablet Take 10 mg by mouth.     fluticasone (FLONASE) 50 MCG/ACT nasal spray Use 2 spray(s) in each nostril once daily 48 g 1   irbesartan (AVAPRO) 300 MG tablet Take 1 tablet by mouth once daily 90 tablet 0   levocetirizine (XYZAL) 5 MG tablet TAKE 1 TABLET BY MOUTH ONCE DAILY IN THE EVENING 90 tablet 0   levothyroxine (SYNTHROID) 75 MCG tablet Take 1 tablet by mouth once daily 90 tablet 0   spironolactone (ALDACTONE) 100 MG tablet Take 1 tablet (100 mg total) by mouth daily. 90 tablet 1   ondansetron (ZOFRAN-ODT) 4 MG disintegrating tablet Take 1 tablet (4 mg total) by mouth every 8 (eight) hours as needed. 20 tablet 0   No facility-administered medications prior to visit.    ROS Review of Systems  Constitutional:  Positive for fatigue. Negative for appetite change, chills, diaphoresis and unexpected weight change.  HENT: Negative.  Negative for trouble swallowing.   Eyes: Negative.   Respiratory:  Negative for cough, chest tightness, shortness of breath and wheezing.    Cardiovascular:  Negative for chest pain, palpitations and leg swelling.  Gastrointestinal:  Positive for abdominal pain. Negative for constipation, diarrhea, nausea and vomiting.  Endocrine: Negative.   Genitourinary: Negative.  Negative for difficulty urinating.  Musculoskeletal:  Positive for arthralgias and neck pain. Negative for back pain.  Skin: Negative.  Negative for color change and pallor.  Neurological:  Positive for numbness. Negative for dizziness and weakness.  Hematological:  Negative for adenopathy. Does not bruise/bleed easily.  Psychiatric/Behavioral: Negative.      Objective:  BP 130/70 (BP Location: Left Arm, Patient Position: Sitting, Cuff Size: Normal)   Pulse 69   Temp 98.2 F (36.8 C) (Oral)   Ht 5' (1.524 m)   Wt 183 lb (83 kg)   SpO2 97%   BMI 35.74 kg/m   BP Readings from Last 3 Encounters:  12/26/22 130/70  12/25/22 (!) 144/76  09/25/22 (!) 154/96    Wt Readings from Last 3 Encounters:  12/26/22 183 lb (83 kg)  12/25/22 183 lb 9.6 oz (83.3 kg)  09/25/22 180 lb (81.6 kg)    Physical Exam Vitals reviewed.  Constitutional:      Appearance: She is not ill-appearing.  HENT:     Nose: Nose normal.     Mouth/Throat:     Mouth: Mucous membranes are moist.  Eyes:     General: No scleral icterus.    Conjunctiva/sclera: Conjunctivae normal.  Cardiovascular:  Rate and Rhythm: Normal rate and regular rhythm.     Heart sounds: No murmur heard. Pulmonary:     Effort: Pulmonary effort is normal.     Breath sounds: No stridor. No wheezing, rhonchi or rales.  Abdominal:     General: Abdomen is flat.     Palpations: There is no mass.     Tenderness: There is no abdominal tenderness. There is no guarding.     Hernia: No hernia is present.  Musculoskeletal:        General: Normal range of motion.     Cervical back: Neck supple.     Right lower leg: No edema.     Left lower leg: No edema.  Lymphadenopathy:     Cervical: No cervical adenopathy.   Skin:    General: Skin is warm and dry.     Findings: No rash.  Neurological:     General: No focal deficit present.     Mental Status: She is alert. Mental status is at baseline.     Motor: Motor function is intact.     Coordination: Coordination is intact.  Psychiatric:        Mood and Affect: Mood normal.        Behavior: Behavior normal.     Lab Results  Component Value Date   WBC 10.4 09/04/2022   HGB 13.4 09/04/2022   HCT 40.8 09/04/2022   PLT 355.0 09/04/2022   GLUCOSE 119 (H) 12/04/2022   CHOL 195 04/10/2022   TRIG 191.0 (H) 04/10/2022   HDL 42.40 04/10/2022   LDLDIRECT 82.0 08/28/2018   LDLCALC 114 (H) 04/10/2022   ALT 16 12/04/2022   AST 16 12/04/2022   NA 140 12/04/2022   K 5.2 12/04/2022   CL 101 12/04/2022   CREATININE 1.00 12/04/2022   BUN 15 12/04/2022   CO2 25 12/04/2022   TSH 3.950 12/04/2022   HGBA1C 6.5 12/26/2022   MICROALBUR <0.7 12/26/2022    MM DIAG BREAST TOMO BILATERAL  Result Date: 11/23/2022 CLINICAL DATA:  58 year old female with diffuse LEFT breast pain. Also for annual bilateral mammogram. EXAM: DIGITAL DIAGNOSTIC BILATERAL MAMMOGRAM WITH TOMOSYNTHESIS TECHNIQUE: Bilateral digital diagnostic mammography and breast tomosynthesis was performed. COMPARISON:  Previous exam(s). ACR Breast Density Category b: There are scattered areas of fibroglandular density. FINDINGS: Full field views of both breasts demonstrate no suspicious mass, distortion or worrisome calcifications. IMPRESSION: No evidence of breast malignancy. RECOMMENDATION: Bilateral screening mammogram in 1 year. Consider clinical follow-up as indicated. Any further workup should be based on clinical grounds. I have discussed the findings, causes of breast/chest pain and recommendations with the patient. If applicable, a reminder letter will be sent to the patient regarding the next appointment. BI-RADS CATEGORY  1: Negative. Electronically Signed   By: Margarette Canada M.D.   On: 11/23/2022  09:17   Assessment & Plan:   Sarah Watts was seen today for hypertension, diabetes and hyperlipidemia.  Diagnoses and all orders for this visit:  Type II diabetes mellitus with manifestations ([place])- Her blood sugar is adequately well-controlled. -     Microalbumin / creatinine urine ratio; Future -     Hemoglobin A1c; Future -     Urinalysis, Routine w reflex microscopic; Future -     Urinalysis, Routine w reflex microscopic -     Hemoglobin A1c -     Microalbumin / creatinine urine ratio -     empagliflozin (JARDIANCE) 10 MG TABS tablet; Take 1 tablet (10 mg total) by mouth  daily before breakfast.  Chronic renal disease, stage 2, mildly decreased glomerular filtration rate (GFR) between 60-89 mL/min/1.73 square meter- Will add Jardiance for renal protection. -     Microalbumin / creatinine urine ratio; Future -     Urinalysis, Routine w reflex microscopic; Future -     Urinalysis, Routine w reflex microscopic -     Microalbumin / creatinine urine ratio -     empagliflozin (JARDIANCE) 10 MG TABS tablet; Take 1 tablet (10 mg total) by mouth daily before breakfast.  Essential hypertension- Her blood pressure is well-controlled. -     Urinalysis, Routine w reflex microscopic; Future -     Urinalysis, Routine w reflex microscopic  Spinal stenosis in cervical region- I recommended that she undergo an MRI to see if she has diease that requires surgical intervention. -     MR Cervical Spine Wo Contrast; Future  Cervicalgia   I have discontinued Sarah Watts's ondansetron. I am also having her start on empagliflozin. Additionally, I am having her maintain her fluticasone, Breztri Aerosphere, Esomeprazole Magnesium (NEXIUM PO), albuterol, levothyroxine, irbesartan, levocetirizine, spironolactone, and famotidine.  Meds ordered this encounter  Medications   empagliflozin (JARDIANCE) 10 MG TABS tablet    Sig: Take 1 tablet (10 mg total) by mouth daily before breakfast.    Dispense:  90  tablet    Refill:  1     Follow-up: No follow-ups on file.  Scarlette Calico, MD

## 2022-12-27 LAB — URINALYSIS, ROUTINE W REFLEX MICROSCOPIC
Bilirubin Urine: NEGATIVE
Hgb urine dipstick: NEGATIVE
Ketones, ur: NEGATIVE
Leukocytes,Ua: NEGATIVE
Nitrite: NEGATIVE
RBC / HPF: NONE SEEN (ref 0–?)
Specific Gravity, Urine: 1.025 (ref 1.000–1.030)
Total Protein, Urine: NEGATIVE
Urine Glucose: NEGATIVE
Urobilinogen, UA: 0.2 (ref 0.0–1.0)
pH: 6 (ref 5.0–8.0)

## 2022-12-27 LAB — MICROALBUMIN / CREATININE URINE RATIO
Creatinine,U: 102.6 mg/dL
Microalb Creat Ratio: 0.7 mg/g (ref 0.0–30.0)
Microalb, Ur: <0.7 mg/dL (ref 0.0–1.9)

## 2022-12-27 LAB — HEMOGLOBIN A1C: Hgb A1c MFr Bld: 6.5 % (ref 4.6–6.5)

## 2022-12-28 ENCOUNTER — Encounter: Payer: Self-pay | Admitting: Internal Medicine

## 2022-12-28 MED ORDER — EMPAGLIFLOZIN 10 MG PO TABS: 10.0000 mg | ORAL_TABLET | Freq: Every day | ORAL | 1 refills | Status: DC

## 2023-01-01 ENCOUNTER — Other Ambulatory Visit: Payer: Self-pay | Admitting: Internal Medicine

## 2023-01-01 ENCOUNTER — Telehealth: Payer: BC Managed Care – PPO | Admitting: Physician Assistant

## 2023-01-01 DIAGNOSIS — J301 Allergic rhinitis due to pollen: Secondary | ICD-10-CM

## 2023-01-01 DIAGNOSIS — M4802 Spinal stenosis, cervical region: Secondary | ICD-10-CM

## 2023-01-01 DIAGNOSIS — I10 Essential (primary) hypertension: Secondary | ICD-10-CM

## 2023-01-01 DIAGNOSIS — M5442 Lumbago with sciatica, left side: Secondary | ICD-10-CM | POA: Diagnosis not present

## 2023-01-01 DIAGNOSIS — E039 Hypothyroidism, unspecified: Secondary | ICD-10-CM

## 2023-01-01 MED ORDER — NAPROXEN 500 MG PO TABS: 500.0000 mg | ORAL_TABLET | Freq: Two times a day (BID) | ORAL | 0 refills | Status: DC

## 2023-01-01 MED ORDER — BACLOFEN 10 MG PO TABS: 10.0000 mg | ORAL_TABLET | Freq: Three times a day (TID) | ORAL | 0 refills | Status: AC

## 2023-01-01 NOTE — Progress Notes (Signed)
We are sorry that you are not feeling well.  Here is how we plan to help!  Based on what you have shared with me it looks like you mostly have acute back pain.  Acute back pain is defined as musculoskeletal pain that can resolve in 1-3 weeks with conservative treatment.  I have prescribed Naprosyn 500 mg take one by mouth twice a day non-steroid anti-inflammatory (NSAID) as well as Baclofen 10 mg every eight hours as needed which is a muscle relaxer.   Some patients experience stomach irritation or in increased heartburn with anti-inflammatory drugs.  Please keep in mind that muscle relaxer's can cause fatigue and should not be taken while at work or driving.  Back pain is very common.  The pain often gets better over time.  The cause of back pain is usually not dangerous.  Most people can learn to manage their back pain on their own.  Home Care Stay active.  Start with short walks on flat ground if you can.  Try to walk farther each day. Do not sit, drive or stand in one place for more than 30 minutes.  Do not stay in bed. Do not avoid exercise or work.  Activity can help your back heal faster. Be careful when you bend or lift an object.  Bend at your knees, keep the object close to you, and do not twist. Sleep on a firm mattress.  Lie on your side, and bend your knees.  If you lie on your back, put a pillow under your knees. Only take medicines as told by your doctor. Put ice on the injured area. Put ice in a plastic bag Place a towel between your skin and the bag Leave the ice on for 15-20 minutes, 3-4 times a day for the first 2-3 days. 210 After that, you can switch between ice and heat packs. Ask your doctor about back exercises or massage. Avoid feeling anxious or stressed.  Find good ways to deal with stress, such as exercise.  Get Help Right Way If: Your pain does not go away with rest or medicine. Your pain does not go away in 1 week. You have new problems. You do not feel  well. The pain spreads into your legs. You cannot control when you poop (bowel movement) or pee (urinate) You feel sick to your stomach (nauseous) or throw up (vomit) You have belly (abdominal) pain. You feel like you may pass out (faint). If you develop a fever.  Make Sure you: Understand these instructions. Will watch your condition Will get help right away if you are not doing well or get worse.  Your e-visit answers were reviewed by a board certified advanced clinical practitioner to complete your personal care plan.  Depending on the condition, your plan could have included both over the counter or prescription medications.  If there is a problem please reply  once you have received a response from your provider.  Your safety is important to us.  If you have drug allergies check your prescription carefully.    You can use MyChart to ask questions about today's visit, request a non-urgent call back, or ask for a work or school excuse for 24 hours related to this e-Visit. If it has been greater than 24 hours you will need to follow up with your provider, or enter a new e-Visit to address those concerns.  You will get an e-mail in the next two days asking about your experience.  I hope   your e-visit has been valuable and will speed your recovery. Thank you for using e-visits.  I have spent 5 minutes in review of e-visit questionnaire, review and updating patient chart, medical decision making and response to patient.   Lenise Arena Ward, PA-C

## 2023-01-02 MED ORDER — LEVOTHYROXINE SODIUM 75 MCG PO TABS: 75.0000 ug | ORAL_TABLET | Freq: Every day | ORAL | 1 refills | Status: DC

## 2023-01-02 MED ORDER — LEVOCETIRIZINE DIHYDROCHLORIDE 5 MG PO TABS: 5.0000 mg | ORAL_TABLET | Freq: Every evening | ORAL | 2 refills | Status: DC

## 2023-01-02 MED ORDER — IRBESARTAN 300 MG PO TABS: 300.0000 mg | ORAL_TABLET | Freq: Every day | ORAL | 1 refills | Status: DC

## 2023-01-11 ENCOUNTER — Other Ambulatory Visit: Payer: BC Managed Care – PPO

## 2023-01-13 ENCOUNTER — Encounter: Payer: Self-pay | Admitting: Internal Medicine

## 2023-02-22 ENCOUNTER — Encounter: Payer: Self-pay | Admitting: Physical Medicine and Rehabilitation

## 2023-02-22 ENCOUNTER — Other Ambulatory Visit (INDEPENDENT_AMBULATORY_CARE_PROVIDER_SITE_OTHER): Payer: BC Managed Care – PPO

## 2023-02-22 ENCOUNTER — Ambulatory Visit (INDEPENDENT_AMBULATORY_CARE_PROVIDER_SITE_OTHER): Payer: BC Managed Care – PPO | Admitting: Physical Medicine and Rehabilitation

## 2023-02-22 DIAGNOSIS — R202 Paresthesia of skin: Secondary | ICD-10-CM | POA: Diagnosis not present

## 2023-02-22 DIAGNOSIS — M5412 Radiculopathy, cervical region: Secondary | ICD-10-CM

## 2023-02-22 DIAGNOSIS — M7918 Myalgia, other site: Secondary | ICD-10-CM | POA: Diagnosis not present

## 2023-02-22 DIAGNOSIS — M25512 Pain in left shoulder: Secondary | ICD-10-CM

## 2023-02-22 DIAGNOSIS — M25511 Pain in right shoulder: Secondary | ICD-10-CM

## 2023-02-22 DIAGNOSIS — G8929 Other chronic pain: Secondary | ICD-10-CM

## 2023-02-22 DIAGNOSIS — M542 Cervicalgia: Secondary | ICD-10-CM

## 2023-02-22 MED ORDER — MELOXICAM 15 MG PO TABS: 15.0000 mg | ORAL_TABLET | Freq: Every day | ORAL | 0 refills | Status: DC

## 2023-02-22 MED ORDER — CYCLOBENZAPRINE HCL 10 MG PO TABS: 10.0000 mg | ORAL_TABLET | Freq: Every day | ORAL | 0 refills | Status: DC

## 2023-02-22 NOTE — Progress Notes (Unsigned)
Sarah Watts - 57 y.o. female MRN [id number]  Date of birth: Jul 05, 1966  Office Visit Note: Visit Date: 02/22/2023 PCP: Etta Grandchild, MD Referred by: Etta Grandchild, MD  Subjective: Chief Complaint  Patient presents with   Neck - Pain   HPI: Sarah Watts is a 57 y.o. female who comes in today per the request of Dr. Sanda Linger for evaluation of chronic, worsening and severe bilateral neck pain radiating down both arms, right greater than left. Pain ongoing for 10 plus years. Patient was told she has cervical spinal canal stenosis. She was recently evaluated by Dr. Yetta Barre whom ordered cervical MRI imaging, however this request was denied by her insurance. Her pain worsens with movement and activity. She describes pain as sore and aching, currently rates as 8 out of 10. Reports trouble sleeping due to severe pain. She also noted paraesthesias to bilateral arms. Some relief of pain with home exercise regimen, rest, heating and medications. Cervical spine x-rays from 2014 exhibits cervical spondylosis, most advanced at C6-C7 where there is left sided foraminal stenosis. No history of surgery/injections. 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 Patient denies focal weakness. No recent trauma or falls.    Review of Systems  Musculoskeletal:  Positive for myalgias and neck pain.  Neurological:  Negative for tingling, sensory change, focal weakness and weakness.  All other systems reviewed and are negative.  Otherwise per HPI.  Assessment & Plan: Visit Diagnoses:    ICD-10-CM   1. Radiculopathy, cervical region  M54.12 XR Cervical Spine 2 or 3 views    2. Chronic pain of both shoulders  M25.511    G89.29    M25.512     3. Paresthesia of skin  R20.2 XR Cervical Spine 2 or 3 views    4. Myofascial pain syndrome  M79.18     5. Cervicalgia  M54.2 MR CERVICAL SPINE WO CONTRAST       Plan: Findings:  Chronic,  worsening and severe bilateral neck pain radiating down both arms, right greater than left. Patient continues to have severe pain despite good conservative therapies such as home exercise regimen, rest and use of medications. Patients clinical presentation and exam are complex, differentials could include cervical radiculopathy and myofascial pain syndrome. Tenderness noted to right levator scapulae muscle upon palpation today. I did obtain cervical x-rays in the office today that exhibit multi level spondylosis, disc height loss at C6-C7. No listhesis. Next step is to place order for cervical MRI imaging. We feel advanced MRI imaging is appropriate as her condition is chronic and ongoing, pain has become more severe recently and are considering interventional spine procedure. We will have patient follow up with [place] after cervical MRI imaging is obtained to review. Depending on results of imaging we discussed possibility of performing cervical epidural steroid injection. Would also recommend physical therapy as I do feel she would benefit from manual treatments and dry needling. I also discussed medication management with her today and prescribed Meloxicam and Flexeril. No red flag symptoms noted upon exam today.     Meds & Orders:  Meds ordered this encounter  Medications   meloxicam (MOBIC) 15 MG tablet    Sig: Take 1 tablet (15 mg total) by mouth daily.    Dispense:  30 tablet    Refill:  0   cyclobenzaprine (FLEXERIL) 10 MG tablet    Sig: Take 1 tablet (10  mg total) by mouth at bedtime.    Dispense:  60 tablet    Refill:  0    Orders Placed This Encounter  Procedures   XR Cervical Spine 2 or 3 views   MR CERVICAL SPINE WO CONTRAST    Follow-up: Return for follow up for cervical MRI review.   Procedures: No procedures performed      Clinical History: No specialty comments available.   She reports that she quit smoking about 2 years ago. Her smoking use included cigarettes. She has a  7.50 pack-year smoking history. She has never used smokeless tobacco.  Recent Labs    09/04/22 1420 12/26/22 1643  HGBA1C 6.6* 6.5    Objective:  VS:  HT:    WT:   BMI:     BP:   HR: bpm  TEMP: ( )  RESP:  Physical Exam Vitals and nursing note reviewed.  HENT:     Head: Normocephalic and atraumatic.     Right Ear: External ear normal.     Left Ear: External ear normal.     Nose: Nose normal.     Mouth/Throat:     Mouth: Mucous membranes are moist.  Eyes:     Extraocular Movements: Extraocular movements intact.  Cardiovascular:     Rate and Rhythm: Normal rate.     Pulses: Normal pulses.  Pulmonary:     Effort: Pulmonary effort is normal.  Abdominal:     General: Abdomen is flat. There is no distension.  Musculoskeletal:        General: Tenderness present.     Cervical back: Tenderness present.     Comments: Discomfort noted with flexion, extension and side-to-side rotation. Patient has good strength in the upper extremities including 5 out of 5 strength in wrist extension, long finger flexion and APB. Shoulder range of motion is full bilaterally without any sign of impingement. There is no atrophy of the hands intrinsically. Sensation intact bilaterally. Tenderness noted to right levator scapulae region upon palpation today. Negative Hoffman's sign. Negative Spurling's sign.     Skin:    General: Skin is warm and dry.     Capillary Refill: Capillary refill takes less than 2 seconds.  Neurological:     General: No focal deficit present.     Mental Status: She is alert and oriented to person, place, and time.  Psychiatric:        Mood and Affect: Mood normal.        Behavior: Behavior normal.     Ortho Exam  Imaging: XR Cervical Spine 2 or 3 views  Result Date: 02/22/2023 2 view radiographs of the cervical spine exhibit normal anatomical alignment, no spondylolisthesis. Anterior osteophytes noted. There is disc height loss at C6-C7. Multi level spondylosis with  foraminal narrowing. No fractures or dislocations.    Past Medical/Family/Surgical/Social History: Medications & Allergies reviewed per EMR, new medications updated. Patient Active Problem List   Diagnosis Date Noted   Type II diabetes mellitus with manifestations (HCC) 09/05/2022   Simple chronic bronchitis (HCC) 12/20/2020   Centrilobular emphysema (HCC) 06/14/2020   Encounter for screening for lung cancer 06/08/2020   Chronic renal disease, stage 2, mildly decreased glomerular filtration rate (GFR) between 60-89 mL/min/1.73 square meter 04/09/2020   Colon cancer screening 10/08/2019   Vitamin D deficiency disease 10/08/2019   B12 nutritional deficiency 04/04/2017   Current severe episode of major depressive disorder without psychotic features without prior episode (HCC) 03/18/2017   Routine general medical  examination at a health care facility 03/12/2016   Tobacco abuse 12/01/2015   Visit for screening mammogram 02/13/2013   Spinal stenosis in cervical region 02/13/2013   Hypothyroid 01/18/2011   OBSTRUCTIVE SLEEP APNEA 01/09/2011   Essential hypertension 12/19/2010   Allergic rhinitis 12/19/2010   Past Medical History:  Diagnosis Date   Allergic rhinitis    Heart murmur    HTN (hypertension)    Irregular heartbeat    Family History  Problem Relation Age of Onset   Hypertension Mother    Allergies Mother    Breast cancer Mother        19s   Heart attack Mother    Stroke Mother    Prostate cancer Father        x2   Cancer Father    Stroke Father    Heart attack Father    Hypertension Father    Diabetes Father    Allergies Father    Heart disease Father    Allergies Sister    Non-Hodgkin's lymphoma Sister        x2   Asthma Sister    Lung cancer Maternal Grandmother    Ovarian cancer Paternal Grandmother    Diabetes Other        1st degree relative   Hypertension Other    Prostate cancer Other        1st degree relative <50   Colon cancer Neg Hx    Past  Surgical History:  Procedure Laterality Date   APPENDECTOMY     CESAREAN SECTION     CHOLECYSTECTOMY     FACIAL RECONSTRUCTION SURGERY     Left, due to birth defect   TONSILLECTOMY     Social History   Occupational History   Occupation: Audiological scientist: [email or web address]  Tobacco Use   Smoking status: Former    Packs/day: 0.50    Years: 15.00    Additional pack years: 0.00    Total pack years: 7.50    Types: Cigarettes    Quit date: 06/07/2020    Years since quitting: 2.7   Smokeless tobacco: Never  Vaping Use   Vaping Use: Former  Substance and Sexual Activity   Alcohol use: No   Drug use: No   Sexual activity: Yes    Comment: female partner

## 2023-02-22 NOTE — Progress Notes (Unsigned)
Functional Pain Scale - descriptive words and definitions  Distressing (6)    Pain is present/unable to complete most ADLs limited by pain/sleep is difficult and active distraction is only marginal. Moderate range order  Average Pain  varies  Neck pain with radiation into arms with tingling and numbness to the hands

## 2023-02-26 ENCOUNTER — Other Ambulatory Visit: Payer: Self-pay | Admitting: Internal Medicine

## 2023-02-26 DIAGNOSIS — E269 Hyperaldosteronism, unspecified: Secondary | ICD-10-CM

## 2023-02-26 DIAGNOSIS — I10 Essential (primary) hypertension: Secondary | ICD-10-CM

## 2023-03-11 DIAGNOSIS — T43595A Adverse effect of other antipsychotics and neuroleptics, initial encounter: Secondary | ICD-10-CM | POA: Diagnosis not present

## 2023-03-11 DIAGNOSIS — R251 Tremor, unspecified: Secondary | ICD-10-CM | POA: Diagnosis not present

## 2023-03-11 DIAGNOSIS — F419 Anxiety disorder, unspecified: Secondary | ICD-10-CM | POA: Diagnosis not present

## 2023-03-13 ENCOUNTER — Ambulatory Visit (INDEPENDENT_AMBULATORY_CARE_PROVIDER_SITE_OTHER): Payer: BC Managed Care – PPO | Admitting: Internal Medicine

## 2023-03-13 VITALS — BP 138/80 | HR 82 | Temp 98.4°F | Ht 60.0 in | Wt 182.0 lb

## 2023-03-13 DIAGNOSIS — R4182 Altered mental status, unspecified: Secondary | ICD-10-CM | POA: Diagnosis not present

## 2023-03-13 LAB — CBC WITH DIFFERENTIAL/PLATELET
Basophils Absolute: 0.1 10*3/uL (ref 0.0–0.1)
Basophils Relative: 0.7 % (ref 0.0–3.0)
Eosinophils Absolute: 0.2 10*3/uL (ref 0.0–0.7)
Eosinophils Relative: 2 % (ref 0.0–5.0)
HCT: 42 % (ref 36.0–46.0)
Hemoglobin: 14 g/dL (ref 12.0–15.0)
Lymphocytes Relative: 20.1 % (ref 12.0–46.0)
Lymphs Abs: 2 10*3/uL (ref 0.7–4.0)
MCHC: 33.2 g/dL (ref 30.0–36.0)
MCV: 88 fl (ref 78.0–100.0)
Monocytes Absolute: 0.6 10*3/uL (ref 0.1–1.0)
Monocytes Relative: 5.8 % (ref 3.0–12.0)
Neutro Abs: 7.1 10*3/uL (ref 1.4–7.7)
Neutrophils Relative %: 71.4 % (ref 43.0–77.0)
Platelets: 396 10*3/uL (ref 150.0–400.0)
RBC: 4.77 Mil/uL (ref 3.87–5.11)
RDW: 13.4 % (ref 11.5–15.5)
WBC: 10 10*3/uL (ref 4.0–10.5)

## 2023-03-13 LAB — COMPREHENSIVE METABOLIC PANEL
ALT: 15 U/L (ref 0–35)
AST: 15 U/L (ref 0–37)
Albumin: 4.5 g/dL (ref 3.5–5.2)
Alkaline Phosphatase: 76 U/L (ref 39–117)
BUN: 17 mg/dL (ref 6–23)
CO2: 27 mEq/L (ref 19–32)
Calcium: 10.5 mg/dL (ref 8.4–10.5)
Chloride: 102 mEq/L (ref 96–112)
Creatinine, Ser: 1.11 mg/dL (ref 0.40–1.20)
GFR: 54.97 mL/min — ABNORMAL LOW (ref >60.00)
Glucose, Bld: 132 mg/dL — ABNORMAL HIGH (ref 70–99)
Potassium: 4.2 mEq/L (ref 3.5–5.1)
Sodium: 136 mEq/L (ref 135–145)
Total Bilirubin: 0.4 mg/dL (ref 0.2–1.2)
Total Protein: 7.4 g/dL (ref 6.0–8.3)

## 2023-03-13 NOTE — Progress Notes (Signed)
Subjective:    Patient ID: Sarah Watts, female    DOB: Jul 12, 1966, 57 y.o.   MRN: [id number]      HPI Sarah Watts is here for  Chief Complaint  Patient presents with   Gummy ingestion    Took synthetic gummy on Saturday that was mixed with Ectasy and Coke     At [facility] - went to hemp dispensary close by and got gummies - found out today they had a little thc, ectasy, cocaine.    Took 1/2 gummy sat 11pm - started to fill ill at 1 am - did not feel good.  She fell going to the bathroom, tried to shower and her left arm contracted against her chest, she kept saying she did not feel right.  She does not recall everything.  She was not able to talk her throat closed up - had difficulty breathing. She felt dizzy.  She felt weird.  EMS called.  Went to ED. Her partner also had some gummies and did not feel well.  No blood work was done.  Since then she has been fatigued, dizzy, posterior headache, weird feeling, fuzzy feeling in back of head.   Yesterday had nausea and Diarrhea.    Has generalized weakness.  Still has some lingering confusion - gets lost at work, can't remember peoples names, forgets what she is saying in the middle of talking.  She has tingling in her legs which is new.  She has chronic tingling in her arms but it is worse than usual.     Medications and allergies reviewed with patient and updated if appropriate.  Current Outpatient Medications on File Prior to Visit  Medication Sig Dispense Refill   albuterol (VENTOLIN HFA) 108 (90 Base) MCG/ACT inhaler Inhale 2 puffs into the lungs every 6 (six) hours as needed for wheezing or shortness of breath. 18 g 5   baclofen (LIORESAL) 10 MG tablet Take 1 tablet (10 mg total) by mouth 3 (three) times daily. 30 each 0   Budeson-Glycopyrrol-Formoterol (BREZTRI AEROSPHERE) 160-9-4.8 MCG/ACT AERO Inhale 2 puffs into the lungs 2 (two) times daily. 32.1 g 1   cyclobenzaprine (FLEXERIL) 10 MG tablet Take 1 tablet (10 mg total) by  mouth at bedtime. 60 tablet 0   empagliflozin (JARDIANCE) 10 MG TABS tablet Take 1 tablet (10 mg total) by mouth daily before breakfast. 90 tablet 1   Esomeprazole Magnesium (NEXIUM PO) Take 1 tablet by mouth daily.     famotidine (PEPCID) 10 MG tablet Take 10 mg by mouth.     fluticasone (FLONASE) 50 MCG/ACT nasal spray Use 2 spray(s) in each nostril once daily 48 g 1   irbesartan (AVAPRO) 300 MG tablet Take 1 tablet (300 mg total) by mouth daily. 90 tablet 1   levocetirizine (XYZAL) 5 MG tablet Take 1 tablet (5 mg total) by mouth every evening. 90 tablet 2   levothyroxine (SYNTHROID) 75 MCG tablet Take 1 tablet (75 mcg total) by mouth daily. 90 tablet 1   meloxicam (MOBIC) 15 MG tablet Take 1 tablet (15 mg total) by mouth daily. 30 tablet 0   naproxen (NAPROSYN) 500 MG tablet Take 1 tablet (500 mg total) by mouth 2 (two) times daily with a meal. 30 tablet 0   spironolactone (ALDACTONE) 100 MG tablet Take 1 tablet (100 mg total) by mouth daily. 90 tablet 1   No current facility-administered medications on file prior to visit.    Review of Systems  Constitutional:  Positive for  appetite change (decreased). Negative for chills and fever.  HENT:  Negative for trouble swallowing.   Eyes:  Positive for visual disturbance (double vision).  Respiratory:  Negative for cough, shortness of breath and wheezing.   Cardiovascular:  Positive for palpitations. Negative for chest pain.  Gastrointestinal:  Positive for diarrhea and nausea. Negative for abdominal pain.  Neurological:  Positive for dizziness, speech difficulty and light-headedness. Negative for tremors, seizures, numbness (tingling in back of head) and headaches.  Psychiatric/Behavioral:  Positive for confusion and hallucinations.        Objective:   Vitals:   03/13/23 1330  BP: (!) 144/86  Pulse: 82  Temp: 98.4 F (36.9 C)  SpO2: 96%   BP Readings from Last 3 Encounters:  03/13/23 (!) 144/86  12/26/22 130/70  12/25/22 (!)  144/76   Wt Readings from Last 3 Encounters:  03/13/23 182 lb (82.6 kg)  12/26/22 183 lb (83 kg)  12/25/22 183 lb 9.6 oz (83.3 kg)   Body mass index is 35.54 kg/m.    Physical Exam Constitutional:      General: She is not in acute distress.    Appearance: Normal appearance. She is not ill-appearing.  HENT:     Head: Normocephalic and atraumatic.  Eyes:     Conjunctiva/sclera: Conjunctivae normal.  Cardiovascular:     Rate and Rhythm: Normal rate and regular rhythm.  Pulmonary:     Effort: Pulmonary effort is normal. No respiratory distress.     Breath sounds: No wheezing or rales.  Musculoskeletal:     Cervical back: Neck supple.     Right lower leg: No edema.     Left lower leg: No edema.  Skin:    General: Skin is warm and dry.  Neurological:     Mental Status: She is alert.     Cranial Nerves: No cranial nerve deficit.     Sensory: Sensory deficit (arms, legs b/l) present.     Motor: Weakness (generalized in b/l arms and legs -no focal weakness) present.            Assessment & Plan:    AMS/confusion, generalized weakness, increased tingling, headache: Acute Multiple symptoms related to take THC gummies that have been identified by the police to contain cocaine, ectasy and other drugs - not sure of which one Her symptoms are likely related to that  Concern for stroke - Ct head stat Will check cbc, cmp Increase water intake, try to eat - her appetite is very low Most likely symptoms will improve slowly over the next few days Note given for work Return or call if symptoms are worse or do not resolve.   I spent 30 minutes dedicated to the care of this patient on the date of this encounter including review of last labs, obtaining history, communicating with the patient and her partner, ordering tests, and documenting clinical information in the EHR

## 2023-03-13 NOTE — Patient Instructions (Addendum)
      Blood work was ordered.   The lab is on the first floor.    Medications changes include :   none    A Ct scan of your head was ordered and someone will call you to schedule an appointment.     Return if symptoms worsen or fail to improve.

## 2023-03-15 ENCOUNTER — Encounter: Payer: Self-pay | Admitting: Internal Medicine

## 2023-03-15 ENCOUNTER — Ambulatory Visit: Payer: BC Managed Care – PPO | Admitting: Internal Medicine

## 2023-03-18 ENCOUNTER — Ambulatory Visit
Admission: RE | Admit: 2023-03-18 | Discharge: 2023-03-18 | Disposition: A | Discharge status: Home / Self Care | Payer: BC Managed Care – PPO | Source: Ambulatory Visit | Attending: Internal Medicine | Admitting: Internal Medicine

## 2023-03-18 DIAGNOSIS — R42 Dizziness and giddiness: Secondary | ICD-10-CM | POA: Diagnosis not present

## 2023-03-18 DIAGNOSIS — J322 Chronic ethmoidal sinusitis: Secondary | ICD-10-CM | POA: Diagnosis not present

## 2023-03-18 DIAGNOSIS — M519 Unspecified thoracic, thoracolumbar and lumbosacral intervertebral disc disorder: Secondary | ICD-10-CM | POA: Diagnosis not present

## 2023-03-18 DIAGNOSIS — R4182 Altered mental status, unspecified: Secondary | ICD-10-CM | POA: Diagnosis not present

## 2023-04-02 ENCOUNTER — Ambulatory Visit (INDEPENDENT_AMBULATORY_CARE_PROVIDER_SITE_OTHER): Payer: BC Managed Care – PPO | Admitting: Internal Medicine

## 2023-04-02 ENCOUNTER — Encounter: Payer: Self-pay | Admitting: Internal Medicine

## 2023-04-02 VITALS — BP 142/72 | HR 92 | Temp 97.9°F | Resp 16 | Ht 60.0 in | Wt 180.0 lb

## 2023-04-02 DIAGNOSIS — R270 Ataxia, unspecified: Secondary | ICD-10-CM | POA: Diagnosis not present

## 2023-04-02 DIAGNOSIS — R4789 Other speech disturbances: Secondary | ICD-10-CM

## 2023-04-02 DIAGNOSIS — E118 Type 2 diabetes mellitus with unspecified complications: Secondary | ICD-10-CM

## 2023-04-02 DIAGNOSIS — E785 Hyperlipidemia, unspecified: Secondary | ICD-10-CM | POA: Insufficient documentation

## 2023-04-02 DIAGNOSIS — G4452 New daily persistent headache (NDPH): Secondary | ICD-10-CM | POA: Insufficient documentation

## 2023-04-02 DIAGNOSIS — N1831 Chronic kidney disease, stage 3a: Secondary | ICD-10-CM

## 2023-04-02 DIAGNOSIS — E538 Deficiency of other specified B group vitamins: Secondary | ICD-10-CM

## 2023-04-02 DIAGNOSIS — H532 Diplopia: Secondary | ICD-10-CM

## 2023-04-02 LAB — LIPID PANEL
Cholesterol: 190 mg/dL (ref 0–200)
HDL: 36.9 mg/dL — ABNORMAL LOW (ref >39.00)
NonHDL: 153.44
Total CHOL/HDL Ratio: 5
Triglycerides: 266 mg/dL — ABNORMAL HIGH (ref 0.0–149.0)
VLDL: 53.2 mg/dL — ABNORMAL HIGH (ref 0.0–40.0)

## 2023-04-02 LAB — FOLATE: Folate: 7.8 ng/mL (ref >5.9)

## 2023-04-02 LAB — LDL CHOLESTEROL, DIRECT: Direct LDL: 123 mg/dL

## 2023-04-02 LAB — HEMOGLOBIN A1C: Hgb A1c MFr Bld: 6.3 % (ref 4.6–6.5)

## 2023-04-02 MED ORDER — ROSUVASTATIN CALCIUM 10 MG PO TABS: 10.0000 mg | ORAL_TABLET | Freq: Every day | ORAL | 1 refills | Status: DC

## 2023-04-02 MED ORDER — CYANOCOBALAMIN 1000 MCG/ML IJ SOLN
1000.0000 ug | Freq: Once | INTRAMUSCULAR | Status: AC
  Administered 2023-04-02: 1000 ug via INTRAMUSCULAR

## 2023-04-02 NOTE — Patient Instructions (Signed)

## 2023-04-02 NOTE — Progress Notes (Signed)
Subjective:  Patient ID: Sarah Watts, female    DOB: May 12, 1966  Age: 57 y.o. MRN: [id number]  CC: Hypertension, Hyperlipidemia, Hypothyroidism, and Headache   HPI Sarah Watts presents for f/up ---  Several weeks ago she was at a casino in [facility] and she got some Gummies out of a dispensary and later had a syncopal episode.  She says the police tested the Gummies and she was told that they were tainted with heroin and cocaine.  She was seen at a local hospital and CT were done.  She has persistent symptoms since this ordeal including brain fog, headache, ataxia, double vision, dizziness, and word finding difficulty.  She also has worsening tingling in her upper and lower extremities.  Outpatient Medications Prior to Visit  Medication Sig Dispense Refill   albuterol (VENTOLIN HFA) 108 (90 Base) MCG/ACT inhaler Inhale 2 puffs into the lungs every 6 (six) hours as needed for wheezing or shortness of breath. 18 g 5   baclofen (LIORESAL) 10 MG tablet Take 1 tablet (10 mg total) by mouth 3 (three) times daily. 30 each 0   Budeson-Glycopyrrol-Formoterol (BREZTRI AEROSPHERE) 160-9-4.8 MCG/ACT AERO Inhale 2 puffs into the lungs 2 (two) times daily. 32.1 g 1   cyclobenzaprine (FLEXERIL) 10 MG tablet Take 1 tablet (10 mg total) by mouth at bedtime. 60 tablet 0   empagliflozin (JARDIANCE) 10 MG TABS tablet Take 1 tablet (10 mg total) by mouth daily before breakfast. 90 tablet 1   Esomeprazole Magnesium (NEXIUM PO) Take 1 tablet by mouth daily.     famotidine (PEPCID) 10 MG tablet Take 10 mg by mouth.     fluticasone (FLONASE) 50 MCG/ACT nasal spray Use 2 spray(s) in each nostril once daily 48 g 1   irbesartan (AVAPRO) 300 MG tablet Take 1 tablet (300 mg total) by mouth daily. 90 tablet 1   levocetirizine (XYZAL) 5 MG tablet Take 1 tablet (5 mg total) by mouth every evening. 90 tablet 2   levothyroxine (SYNTHROID) 75 MCG tablet Take 1 tablet (75 mcg total) by mouth daily. 90  tablet 1   spironolactone (ALDACTONE) 100 MG tablet Take 1 tablet (100 mg total) by mouth daily. 90 tablet 1   meloxicam (MOBIC) 15 MG tablet Take 1 tablet (15 mg total) by mouth daily. 30 tablet 0   naproxen (NAPROSYN) 500 MG tablet Take 1 tablet (500 mg total) by mouth 2 (two) times daily with a meal. 30 tablet 0   No facility-administered medications prior to visit.    ROS Review of Systems  Constitutional: Negative.  Negative for chills, diaphoresis, fatigue, fever and unexpected weight change.  HENT: Negative.  Negative for sore throat and trouble swallowing.   Eyes:  Positive for visual disturbance. Negative for photophobia and redness.  Respiratory:  Negative for cough, chest tightness, shortness of breath and wheezing.   Cardiovascular:  Negative for chest pain, palpitations and leg swelling.  Gastrointestinal:  Negative for abdominal pain, constipation, diarrhea, nausea and vomiting.  Musculoskeletal:  Positive for arthralgias, gait problem and neck pain. Negative for back pain and myalgias.  Skin: Negative.   Neurological:  Positive for dizziness, syncope, numbness and headaches. Negative for weakness and light-headedness.  Hematological:  Negative for adenopathy. Does not bruise/bleed easily.  Psychiatric/Behavioral: Negative.      Objective:  BP (!) 142/72 (BP Location: Right Arm, Patient Position: Sitting, Cuff Size: Large)   Pulse 92   Temp 97.9 F (36.6 C) (Oral)   Resp 16  Ht 5' (1.524 m)   Wt 180 lb (81.6 kg)   SpO2 96%   BMI 35.15 kg/m   BP Readings from Last 3 Encounters:  04/02/23 (!) 142/72  03/13/23 138/80  12/26/22 130/70    Wt Readings from Last 3 Encounters:  04/02/23 180 lb (81.6 kg)  03/13/23 182 lb (82.6 kg)  12/26/22 183 lb (83 kg)    Physical Exam Vitals reviewed.  Constitutional:      Appearance: She is well-developed.  Eyes:     General: No scleral icterus.    Extraocular Movements: Extraocular movements intact.     Pupils: Pupils  are equal, round, and reactive to light.  Cardiovascular:     Rate and Rhythm: Normal rate and regular rhythm.     Heart sounds: No murmur heard. Pulmonary:     Effort: Pulmonary effort is normal.     Breath sounds: No stridor. No wheezing, rhonchi or rales.  Abdominal:     General: Abdomen is flat.     Palpations: There is no mass.     Tenderness: There is no abdominal tenderness. There is no guarding.     Hernia: No hernia is present.  Musculoskeletal:        General: Normal range of motion.     Cervical back: Neck supple.     Right lower leg: No edema.     Left lower leg: No edema.  Lymphadenopathy:     Cervical: No cervical adenopathy.  Skin:    General: Skin is warm and dry.  Neurological:     Mental Status: She is alert.     Cranial Nerves: Cranial nerves 2-12 are intact.     Sensory: Sensation is intact. No sensory deficit.     Motor: No weakness or atrophy.     Coordination: Romberg sign positive. Coordination normal. Heel to Shin Test abnormal. Finger-Nose-Finger Test normal. Rapid alternating movements normal.     Gait: Gait abnormal.     Deep Tendon Reflexes: Reflexes normal. Babinski sign absent on the right side. Babinski sign absent on the left side.     Reflex Scores:      Tricep reflexes are 1+ on the right side and 1+ on the left side.      Bicep reflexes are 1+ on the right side and 1+ on the left side.      Brachioradialis reflexes are 1+ on the right side and 1+ on the left side.      Patellar reflexes are 1+ on the right side and 1+ on the left side.      Achilles reflexes are 1+ on the right side and 1+ on the left side.    Lab Results  Component Value Date   WBC 10.0 03/13/2023   HGB 14.0 03/13/2023   HCT 42.0 03/13/2023   PLT 396.0 03/13/2023   GLUCOSE 132 (H) 03/13/2023   CHOL 190 04/02/2023   TRIG 266.0 (H) 04/02/2023   HDL 36.90 (L) 04/02/2023   LDLDIRECT 123.0 04/02/2023   LDLCALC 114 (H) 04/10/2022   ALT 15 03/13/2023   AST 15 03/13/2023    NA 136 03/13/2023   K 4.2 03/13/2023   CL 102 03/13/2023   CREATININE 1.11 03/13/2023   BUN 17 03/13/2023   CO2 27 03/13/2023   TSH 3.950 12/04/2022   HGBA1C 6.3 04/02/2023   MICROALBUR <0.7 12/26/2022    CT HEAD WO CONTRAST ( )  Result Date: 03/18/2023 CLINICAL DATA:  Altered mental status, headaches, dizziness EXAM: CT  HEAD WITHOUT CONTRAST TECHNIQUE: Contiguous axial images were obtained from the base of the skull through the vertex without intravenous contrast. RADIATION DOSE REDUCTION: This exam was performed according to the departmental dose-optimization program which includes automated exposure control, adjustment of the mA and/or kV according to patient size and/or use of iterative reconstruction technique. COMPARISON:  None Available. FINDINGS: Brain: No acute intracranial findings are seen. There are no signs of bleeding within the cranium. Ventricles are unremarkable. There is decreased density in periventricular white matter. Cortical sulci are prominent. Vascular: Unremarkable. Skull: No acute findings are seen. There is subcutaneous contusion/hematoma in the right parietal scalp. Sinuses/Orbits: There is mild mucosal thickening in ethmoid sinus. Other: None. IMPRESSION: No acute intracranial findings are seen in noncontrast CT brain. Mild chronic ethmoid sinusitis. Electronically Signed   By: Ernie Avena M.D.   On: 03/18/2023 15:22    CT HEAD WO CONTRAST ( )  Result Date: 03/18/2023 CLINICAL DATA:  Altered mental status, headaches, dizziness EXAM: CT HEAD WITHOUT CONTRAST TECHNIQUE: Contiguous axial images were obtained from the base of the skull through the vertex without intravenous contrast. RADIATION DOSE REDUCTION: This exam was performed according to the departmental dose-optimization program which includes automated exposure control, adjustment of the mA and/or kV according to patient size and/or use of iterative reconstruction technique. COMPARISON:  None  Available. FINDINGS: Brain: No acute intracranial findings are seen. There are no signs of bleeding within the cranium. Ventricles are unremarkable. There is decreased density in periventricular white matter. Cortical sulci are prominent. Vascular: Unremarkable. Skull: No acute findings are seen. There is subcutaneous contusion/hematoma in the right parietal scalp. Sinuses/Orbits: There is mild mucosal thickening in ethmoid sinus. Other: None. IMPRESSION: No acute intracranial findings are seen in noncontrast CT brain. Mild chronic ethmoid sinusitis. Electronically Signed   By: Ernie Avena M.D.   On: 03/18/2023 15:22   XR Cervical Spine 2 or 3 views  Result Date: 02/22/2023 2 view radiographs of the cervical spine exhibit normal anatomical alignment, no spondylolisthesis. Anterior osteophytes noted. There is disc height loss at C6-C7. Multi level spondylosis with foraminal narrowing. No fractures or dislocations.     Assessment & Plan:   B12 deficiency -     Folate; Future -     Cyanocobalamin  Dyslipidemia, goal LDL below 100- I recommended that she take a statin for cardiovascular risk reduction. -     Lipid panel; Future -     Rosuvastatin Calcium; Take 1 tablet (10 mg total) by mouth daily.  Dispense: 90 tablet; Refill: 1  Type II diabetes mellitus with manifestations (HCC) -     Hemoglobin A1c; Future  Ataxia- I recommended that she undergo an MRI to evaluate for NPH, demyelination, bleed, CVA, tumor, subdural hematoma. -     MR BRAIN WO CONTRAST; Future  New daily persistent headache -     MR BRAIN WO CONTRAST; Future  Double vision -     MR BRAIN WO CONTRAST; Future  Word finding difficulty -     MR BRAIN WO CONTRAST; Future  Stage 3a chronic kidney disease (HCC)- Will avoid nephrotoxic agents.  Other orders -     LDL cholesterol, direct     Follow-up: Return in about 3 months (around 07/03/2023).  Sanda Linger, MD

## 2023-04-16 ENCOUNTER — Other Ambulatory Visit: Payer: Self-pay | Admitting: Acute Care

## 2023-04-16 DIAGNOSIS — Z87891 Personal history of nicotine dependence: Secondary | ICD-10-CM

## 2023-04-16 DIAGNOSIS — Z122 Encounter for screening for malignant neoplasm of respiratory organs: Secondary | ICD-10-CM

## 2023-04-21 ENCOUNTER — Other Ambulatory Visit: Payer: BC Managed Care – PPO

## 2023-06-13 ENCOUNTER — Other Ambulatory Visit: Payer: Self-pay | Admitting: Internal Medicine

## 2023-06-13 DIAGNOSIS — E118 Type 2 diabetes mellitus with unspecified complications: Secondary | ICD-10-CM

## 2023-06-13 DIAGNOSIS — N182 Chronic kidney disease, stage 2 (mild): Secondary | ICD-10-CM

## 2023-06-13 MED ORDER — EMPAGLIFLOZIN 10 MG PO TABS: 10.0000 mg | ORAL_TABLET | Freq: Every day | ORAL | 0 refills | Status: DC

## 2023-06-25 ENCOUNTER — Ambulatory Visit: Payer: BC Managed Care – PPO | Admitting: "Endocrinology

## 2023-06-29 ENCOUNTER — Ambulatory Visit
Admission: RE | Admit: 2023-06-29 | Discharge: 2023-06-29 | Disposition: A | Discharge status: Home / Self Care | Payer: BC Managed Care – PPO | Source: Ambulatory Visit | Attending: Physical Medicine and Rehabilitation | Admitting: Physical Medicine and Rehabilitation

## 2023-06-29 ENCOUNTER — Other Ambulatory Visit: Payer: Self-pay | Admitting: Internal Medicine

## 2023-06-29 ENCOUNTER — Ambulatory Visit
Admission: RE | Admit: 2023-06-29 | Discharge: 2023-06-29 | Disposition: A | Discharge status: Home / Self Care | Payer: BC Managed Care – PPO | Source: Ambulatory Visit | Attending: Internal Medicine | Admitting: Internal Medicine

## 2023-06-29 DIAGNOSIS — H532 Diplopia: Secondary | ICD-10-CM

## 2023-06-29 DIAGNOSIS — M47812 Spondylosis without myelopathy or radiculopathy, cervical region: Secondary | ICD-10-CM | POA: Diagnosis not present

## 2023-06-29 DIAGNOSIS — M4312 Spondylolisthesis, cervical region: Secondary | ICD-10-CM | POA: Diagnosis not present

## 2023-06-29 DIAGNOSIS — G4452 New daily persistent headache (NDPH): Secondary | ICD-10-CM

## 2023-06-29 DIAGNOSIS — R4789 Other speech disturbances: Secondary | ICD-10-CM

## 2023-06-29 DIAGNOSIS — M4802 Spinal stenosis, cervical region: Secondary | ICD-10-CM | POA: Diagnosis not present

## 2023-06-29 DIAGNOSIS — R270 Ataxia, unspecified: Secondary | ICD-10-CM

## 2023-06-29 DIAGNOSIS — I6389 Other cerebral infarction: Secondary | ICD-10-CM | POA: Diagnosis not present

## 2023-06-29 DIAGNOSIS — I10 Essential (primary) hypertension: Secondary | ICD-10-CM

## 2023-06-29 DIAGNOSIS — E039 Hypothyroidism, unspecified: Secondary | ICD-10-CM

## 2023-07-02 ENCOUNTER — Ambulatory Visit
Admission: RE | Admit: 2023-07-02 | Discharge: 2023-07-02 | Disposition: A | Discharge status: Home / Self Care | Payer: BC Managed Care – PPO | Source: Ambulatory Visit | Attending: Internal Medicine | Admitting: Internal Medicine

## 2023-07-02 DIAGNOSIS — Z87891 Personal history of nicotine dependence: Secondary | ICD-10-CM | POA: Diagnosis not present

## 2023-07-02 DIAGNOSIS — Z122 Encounter for screening for malignant neoplasm of respiratory organs: Secondary | ICD-10-CM

## 2023-07-05 ENCOUNTER — Telehealth: Payer: Self-pay

## 2023-07-05 NOTE — Telephone Encounter (Signed)
-----   Message from [place], [place] E sent at 07/04/2023  9:09 AM EDT ----- She will need office visit to discuss recent cervical MRI imaging. Thanks ----- Message ----- From: Interface, Rad Results In Sent: 07/03/2023   7:26 PM EDT To: Juanda Chance, NP

## 2023-07-05 NOTE — Telephone Encounter (Signed)
Spoke with patient through MyChart and scheduled OV for 07/09/23.

## 2023-07-09 ENCOUNTER — Encounter: Payer: Self-pay | Admitting: Physical Medicine and Rehabilitation

## 2023-07-09 ENCOUNTER — Other Ambulatory Visit: Payer: Self-pay

## 2023-07-09 ENCOUNTER — Ambulatory Visit (INDEPENDENT_AMBULATORY_CARE_PROVIDER_SITE_OTHER): Payer: BC Managed Care – PPO | Admitting: Physical Medicine and Rehabilitation

## 2023-07-09 DIAGNOSIS — M7918 Myalgia, other site: Secondary | ICD-10-CM | POA: Diagnosis not present

## 2023-07-09 DIAGNOSIS — M47812 Spondylosis without myelopathy or radiculopathy, cervical region: Secondary | ICD-10-CM

## 2023-07-09 DIAGNOSIS — M4802 Spinal stenosis, cervical region: Secondary | ICD-10-CM

## 2023-07-09 DIAGNOSIS — M5412 Radiculopathy, cervical region: Secondary | ICD-10-CM | POA: Diagnosis not present

## 2023-07-09 DIAGNOSIS — Z122 Encounter for screening for malignant neoplasm of respiratory organs: Secondary | ICD-10-CM

## 2023-07-09 DIAGNOSIS — Z87891 Personal history of nicotine dependence: Secondary | ICD-10-CM

## 2023-07-09 MED ORDER — DIAZEPAM 5 MG PO TABS: ORAL_TABLET | ORAL | 0 refills | Status: DC

## 2023-07-09 NOTE — Progress Notes (Signed)
Sarah Watts - 57 y.o. female MRN [id number]  Date of birth: 09/18/1966  Office Visit Note: Visit Date: 07/09/2023 PCP: Etta Grandchild, MD Referred by: Etta Grandchild, MD  Subjective: Chief Complaint  Patient presents with   Neck - Pain   HPI: Sarah Watts is a 57 y.o. female who comes in today for evaluation of chronic, worsening and severe bilateral neck pain, radiating down bilateral arms, right greater than left. Intermittent pain radiating up to head. Patient last seen in our office in April. Pain ongoing for 10 plus years, worsens with movement and activity. She describes pain as sore and burning sensation, currently rates as 8 out of 10. Reports numbness/tingling to bilateral hands. Some relief of pain with home exercise regimen, heating pad, rest and use of medications. Good relief of pain with Meloxicam and Flexeril. We did have issues getting cervical MRI imaging approved with her insurance. She was finally able to have imaging on 06/29/2023. Cervical MRI imaging exhibits spondylosis, multi level foraminal stenosis, moderate on the right at C4-C5 and C5-C6, left at C6-C7. Facet arthropathy at C4-C5 and C5-C6. No high grade spinal canal stenosis noted. Patient currently works at [employer] in [employer]. Patient denies focal weakness. No recent trauma or falls.    Review of Systems  Musculoskeletal:  Positive for myalgias and neck pain.  Neurological:  Positive for tingling. Negative for focal weakness and weakness.  All other systems reviewed and are negative.  Otherwise per HPI.  Assessment & Plan: Visit Diagnoses:    ICD-10-CM   1. Radiculopathy, cervical region  M54.12 Ambulatory referral to Physical Therapy    Ambulatory referral to Physical Medicine Rehab    2. Foraminal stenosis of cervical region  M48.02 Ambulatory referral to Physical Therapy    Ambulatory referral to Physical Medicine Rehab    3. Facet arthropathy, cervical  M47.812 Ambulatory referral to  Physical Therapy    Ambulatory referral to Physical Medicine Rehab    4. Myofascial pain syndrome  M79.18 Ambulatory referral to Physical Therapy    Ambulatory referral to Physical Medicine Rehab       Plan: Findings:  Chronic, worsening and severe bilateral neck pain, radiating down bilateral arms, right greater than left. Intermittent pain radiating up to head. Patient continues to have severe pain despite good conservative therapies such as home exercise regimen, rest and use of medications. Patients clinical presentation and complex, differentials include cervical radiculopathy, cervical facet mediated pain and myofascial pain syndrome. Radicular symptoms noted to more of C5 and C6 dermatomes, does have pain with side to side rotation of neck, right greater than left and tenderness to bilateral levator scapulae/trapezius regions. Next step is to perform diagnostic and hopefully therapeutic right C7-T1 interlaminar epidural steroid injection under fluoroscopic guidance. She is not currently taking anticoagulants. Dr. Alvester Morin at bedside to discuss injection procedure. If good relief of pain with injection we can repeat infrequently as needed. If good relief of radicular symptoms but neck pain remains would consider cervical facet joint injections. I also placed order for regimen of formal physical therapy, patient prefers to attend PT in [place] where she lives. Patient did voice anxiety related to injection procedure, I called in pre-procedure Valium for her to take on day of procedure. She has no questions at this time. She can remain active as tolerated, no work restrictions at this time. No red flag symptoms noted upon exam today.   Dr. Alvester Morin participated with direct patient care including clinical  review, exam when needed and significant portion of diagnostic and treatment plan.     Meds & Orders:  Meds ordered this encounter  Medications   diazepam (VALIUM) 5 MG tablet    Sig: Take one  tablet by mouth with food one hour prior to procedure. May repeat 30 minutes prior if needed.    Dispense:  2 tablet    Refill:  0    Orders Placed This Encounter  Procedures   Ambulatory referral to Physical Therapy   Ambulatory referral to Physical Medicine Rehab    Follow-up: Return for Right C7-T1 interlaminar epidural steroid injection.   Procedures: No procedures performed      Clinical History: CLINICAL DATA:  Provided history: Cervicalgia. Neck pain, chronic. Additional history provided by the scanning technologist: The patient reports headaches, dizziness, forgetfulness, weakness and numbness in bilateral arms/legs.   EXAM: MRI CERVICAL SPINE WITHOUT CONTRAST   TECHNIQUE: Multiplanar, multisequence MR imaging of the cervical spine was performed. No intravenous contrast was administered.   COMPARISON:  Same-day brain MRI 06/29/2023. Radiographs of the cervical spine 02/22/2023.   FINDINGS: Alignment: Slight C3-C4 grade 1 anterolisthesis.   Vertebrae: Cervical vertebral body height is maintained. Multilevel degenerative endplate irregularity, greatest at C6-C7. No significant marrow edema or focal suspicious osseous lesion. Multilevel ventral osteophytes.   Cord: No signal abnormality identified within the cervical spinal cord.   Posterior Fossa, vertebral arteries, paraspinal tissues: Posterior fossa assessed on same-day brain MRI. Flow voids preserved within the imaged cervical vertebral arteries. No paraspinal mass or collection.   Disc levels:   Multilevel disc degeneration, greatest at C5-C6 (moderate) and C6-C7 (moderately advanced).   C2-C3: No significant disc herniation or spinal canal stenosis. Uncovertebral hypertrophy and facet arthrosis on the right. Mild right neural foraminal narrowing.   C3-C4: Slight grade 1 anterolisthesis. Facet arthrosis (greater on the right). No significant spinal canal stenosis. Mild relative right neural  foraminal narrowing.   C4-C5: Disc bulge with superimposed tiny central disc protrusion. Facet arthrosis (predominantly on the right). Mild effacement of the ventral thecal sac. Moderate right neural foraminal narrowing.   C5-C6: Disc bulge with endplate spurring and right-sided disc osteophyte ridge/uncinate hypertrophy. Facet arthrosis. Mild spinal canal narrowing. Moderate right neural foraminal narrowing.   C6-C7: Disc bulge asymmetric to the left with endplate spurring and left greater than right disc osteophyte ridge/uncinate hypertrophy. Minimal facet arthrosis. Mild spinal canal stenosis. Bilateral neural foraminal narrowing (mild right, moderate left).   C7-T1: Mild facet arthrosis (predominantly on the right). No significant disc herniation or stenosis.   IMPRESSION: 1. Cervical spondylosis as outlined. 2. No more than mild spinal canal stenosis. 3. Multilevel foraminal stenosis, as detailed and greatest on the right at C4-C5, on the right at C5-C6 and on the left at C6-C7 (moderate in severity at these sites). 4. Disc degeneration is greatest at C5-C6 (moderate) and C6-C7 (moderately advanced). 5. Slight C3-C4 grade 1 anterolisthesis.     Electronically Signed   By: Jackey Loge D.O.   On: 07/03/2023 19:23   She reports that she quit smoking about 3 years ago. Her smoking use included cigarettes. She started smoking about 18 years ago. She has a 7.5 pack-year smoking history. She has never used smokeless tobacco.  Recent Labs    09/04/22 1420 12/26/22 1643 04/02/23 1339  HGBA1C 6.6* 6.5 6.3    Objective:  VS:  HT:    WT:   BMI:     BP:   HR: bpm  TEMP: ( )  RESP:  Physical Exam Vitals and nursing note reviewed.  HENT:     Head: Normocephalic and atraumatic.     Right Ear: External ear normal.     Left Ear: External ear normal.     Nose: Nose normal.     Mouth/Throat:     Mouth: Mucous membranes are moist.  Eyes:     Extraocular Movements:  Extraocular movements intact.  Cardiovascular:     Rate and Rhythm: Normal rate.     Pulses: Normal pulses.  Pulmonary:     Effort: Pulmonary effort is normal.  Abdominal:     General: Abdomen is flat. There is no distension.  Musculoskeletal:        General: Tenderness present.     Cervical back: Tenderness present.     Comments: Discomfort noted with side-to-side rotation. Patient has good strength in the upper extremities including 5 out of 5 strength in wrist extension, long finger flexion and APB. Shoulder range of motion is full bilaterally without any sign of impingement. There is no atrophy of the hands intrinsically. Sensation intact bilaterally. Dysesthesias noted to bilateral C5 and C6 dermatomes. Tenderness noted to bilateral levator scapulae and trapezius upon palpation. Negative Hoffman's sign. Negative Spurling's sign.     Skin:    General: Skin is warm and dry.     Capillary Refill: Capillary refill takes less than 2 seconds.  Neurological:     General: No focal deficit present.     Mental Status: She is alert and oriented to person, place, and time.  Psychiatric:        Mood and Affect: Mood normal.        Behavior: Behavior normal.     Ortho Exam  Imaging: No results found.  Past Medical/Family/Surgical/Social History: Medications & Allergies reviewed per EMR, new medications updated. Patient Active Problem List   Diagnosis Date Noted   Ataxia 04/02/2023   Dyslipidemia, goal LDL below 100 04/02/2023   B12 deficiency 04/02/2023   New daily persistent headache 04/02/2023   Double vision 04/02/2023   Word finding difficulty 04/02/2023   Stage 3a chronic kidney disease (HCC) 04/02/2023   Type II diabetes mellitus with manifestations (HCC) 09/05/2022   Simple chronic bronchitis (HCC) 12/20/2020   Centrilobular emphysema (HCC) 06/14/2020   Encounter for screening for lung cancer 06/08/2020   Colon cancer screening 10/08/2019   Vitamin D deficiency disease  10/08/2019   Current severe episode of major depressive disorder without psychotic features without prior episode (HCC) 03/18/2017   Routine general medical examination at a health care facility 03/12/2016   Tobacco abuse 12/01/2015   Visit for screening mammogram 02/13/2013   Spinal stenosis in cervical region 02/13/2013   Hypothyroid 01/18/2011   OBSTRUCTIVE SLEEP APNEA 01/09/2011   Essential hypertension 12/19/2010   Allergic rhinitis 12/19/2010   Past Medical History:  Diagnosis Date   Allergic rhinitis    Heart murmur    HTN (hypertension)    Irregular heartbeat    Family History  Problem Relation Age of Onset   Hypertension Mother    Allergies Mother    Breast cancer Mother        9s   Heart attack Mother    Stroke Mother    Prostate cancer Father        x2   Cancer Father    Stroke Father    Heart attack Father    Hypertension Father    Diabetes Father    Allergies Father  Heart disease Father    Allergies Sister    Non-Hodgkin's lymphoma Sister        x2   Asthma Sister    Lung cancer Maternal Grandmother    Ovarian cancer Paternal Grandmother    Diabetes Other        1st degree relative   Hypertension Other    Prostate cancer Other        1st degree relative <50   Colon cancer Neg Hx    Past Surgical History:  Procedure Laterality Date   APPENDECTOMY     CESAREAN SECTION     CHOLECYSTECTOMY     FACIAL RECONSTRUCTION SURGERY     Left, due to birth defect   TONSILLECTOMY     Social History   Occupational History   Occupation: Audiological scientist: [id number]  Tobacco Use   Smoking status: Former    Current packs/day: 0.00    Average packs/day: 0.5 packs/day for 15.0 years (7.5 ttl pk-yrs)    Types: Cigarettes    Start date: 06/07/2005    Quit date: 06/07/2020    Years since quitting: 3.0   Smokeless tobacco: Never  Vaping Use   Vaping status: Former  Substance and Sexual Activity   Alcohol use: No   Drug use: No   Sexual activity:  Yes    Comment: female partner

## 2023-07-09 NOTE — Progress Notes (Signed)
Functional Pain Scale - descriptive words and definitions  Distracting (5)    Aware of pain/able to complete some ADL's but limited by pain/sleep is affected and active distractions are only slightly useful. Moderate range order  Average Pain 8  Neck pain. MRI review

## 2023-07-11 LAB — HM PAP SMEAR

## 2023-07-14 ENCOUNTER — Encounter: Payer: BC Managed Care – PPO | Admitting: Nurse Practitioner

## 2023-07-14 DIAGNOSIS — N760 Acute vaginitis: Secondary | ICD-10-CM

## 2023-07-14 NOTE — Progress Notes (Signed)
I have spent 5 minutes in review of e-visit questionnaire, review and updating patient chart, medical decision making and response to patient.  ° °Jerrell Mangel W Secilia Apps, NP ° °  °

## 2023-07-14 NOTE — Progress Notes (Signed)
Because you are experiencing numerous symptoms that are not typical of simple vaginitis, I feel your condition warrants further evaluation and I recommend that you be seen for a face to face visit for a vaginal swab, urine culture and pap smear.  Please contact your primary care physician practice to be seen. Many offices offer virtual options to be seen via video if you are not comfortable going in person to a medical facility at this time.  NOTE: You will NOT be charged for this eVisit.  If you do not have a PCP, [facility] offers a free physician referral service available at [phone]. Our trained staff has the experience, knowledge and resources to put you in touch with a physician who is right for you.    If you are having a true medical emergency please call 911.   Your e-visit answers were reviewed by a board certified advanced clinical practitioner to complete your personal care plan.  Thank you for using e-Visits.

## 2023-07-26 ENCOUNTER — Other Ambulatory Visit: Payer: Self-pay | Admitting: "Endocrinology

## 2023-07-26 DIAGNOSIS — E269 Hyperaldosteronism, unspecified: Secondary | ICD-10-CM

## 2023-07-26 DIAGNOSIS — I10 Essential (primary) hypertension: Secondary | ICD-10-CM

## 2023-08-07 ENCOUNTER — Other Ambulatory Visit (HOSPITAL_COMMUNITY)
Admission: RE | Admit: 2023-08-07 | Discharge: 2023-08-07 | Disposition: A | Discharge status: Home / Self Care | Payer: BC Managed Care – PPO | Source: Ambulatory Visit | Attending: Internal Medicine | Admitting: Internal Medicine

## 2023-08-07 ENCOUNTER — Ambulatory Visit: Payer: BC Managed Care – PPO | Admitting: Internal Medicine

## 2023-08-07 ENCOUNTER — Encounter: Payer: Self-pay | Admitting: Internal Medicine

## 2023-08-07 VITALS — BP 130/82 | HR 66 | Temp 98.2°F | Ht 60.0 in | Wt 180.0 lb

## 2023-08-07 DIAGNOSIS — B3731 Acute candidiasis of vulva and vagina: Secondary | ICD-10-CM

## 2023-08-07 DIAGNOSIS — E039 Hypothyroidism, unspecified: Secondary | ICD-10-CM | POA: Insufficient documentation

## 2023-08-07 DIAGNOSIS — I10 Essential (primary) hypertension: Secondary | ICD-10-CM | POA: Diagnosis not present

## 2023-08-07 DIAGNOSIS — E1122 Type 2 diabetes mellitus with diabetic chronic kidney disease: Secondary | ICD-10-CM | POA: Diagnosis not present

## 2023-08-07 DIAGNOSIS — N1831 Chronic kidney disease, stage 3a: Secondary | ICD-10-CM | POA: Insufficient documentation

## 2023-08-07 DIAGNOSIS — Z124 Encounter for screening for malignant neoplasm of cervix: Secondary | ICD-10-CM | POA: Insufficient documentation

## 2023-08-07 DIAGNOSIS — N898 Other specified noninflammatory disorders of vagina: Secondary | ICD-10-CM | POA: Diagnosis not present

## 2023-08-07 DIAGNOSIS — E118 Type 2 diabetes mellitus with unspecified complications: Secondary | ICD-10-CM | POA: Diagnosis not present

## 2023-08-07 DIAGNOSIS — I129 Hypertensive chronic kidney disease with stage 1 through stage 4 chronic kidney disease, or unspecified chronic kidney disease: Secondary | ICD-10-CM | POA: Diagnosis not present

## 2023-08-07 DIAGNOSIS — L2084 Intrinsic (allergic) eczema: Secondary | ICD-10-CM

## 2023-08-07 DIAGNOSIS — Z23 Encounter for immunization: Secondary | ICD-10-CM | POA: Diagnosis not present

## 2023-08-07 LAB — URINALYSIS, ROUTINE W REFLEX MICROSCOPIC
Bilirubin Urine: NEGATIVE
Hgb urine dipstick: NEGATIVE
Ketones, ur: NEGATIVE
Leukocytes,Ua: NEGATIVE
Nitrite: NEGATIVE
Specific Gravity, Urine: 1.02 (ref 1.000–1.030)
Total Protein, Urine: NEGATIVE
Urine Glucose: >=1000 — AB
Urobilinogen, UA: 0.2 (ref 0.0–1.0)
WBC, UA: NONE SEEN (ref 0–?)
pH: 6 (ref 5.0–8.0)

## 2023-08-07 LAB — BASIC METABOLIC PANEL
BUN: 21 mg/dL (ref 6–23)
CO2: 29 meq/L (ref 19–32)
Calcium: 11 mg/dL — ABNORMAL HIGH (ref 8.4–10.5)
Chloride: 97 meq/L (ref 96–112)
Creatinine, Ser: 1.09 mg/dL (ref 0.40–1.20)
GFR: 56.02 mL/min — ABNORMAL LOW (ref >60.00)
Glucose, Bld: 115 mg/dL — ABNORMAL HIGH (ref 70–99)
Potassium: 4.3 meq/L (ref 3.5–5.1)
Sodium: 133 meq/L — ABNORMAL LOW (ref 135–145)

## 2023-08-07 LAB — HEMOGLOBIN A1C: Hgb A1c MFr Bld: 6.4 % (ref 4.6–6.5)

## 2023-08-07 MED ORDER — FLUCONAZOLE 150 MG PO TABS
150.0000 mg | ORAL_TABLET | Freq: Once | ORAL | 3 refills | Status: AC
Start: 2023-08-07 — End: 2023-08-07

## 2023-08-07 MED ORDER — FLUOCINONIDE EMULSIFIED BASE 0.05 % EX CREA
1.0000 | TOPICAL_CREAM | Freq: Two times a day (BID) | CUTANEOUS | 2 refills | Status: AC
Start: 2023-08-07 — End: ?

## 2023-08-07 NOTE — Progress Notes (Signed)
Subjective:  Patient ID: Sarah Watts, female    DOB: 1966-09-25  Age: 57 y.o. MRN: [id number]  CC: Rash, Hypothyroidism, and Diabetes   HPI Sarah Watts presents for f/up ----  Discussed the use of AI scribe software for clinical note transcription with the patient, who gave verbal consent to proceed.  History of Present Illness   The patient, with a history of stroke, presents with persistent symptoms including dizziness, difficulty swallowing pills, tripping over feet, and confusion. She also reports numbness and weakness in her hands and legs. She has experienced shortness of breath, which she has managed with an inhaler, but denies chest pain. She has quit smoking.  The patient also reports increased thirst and memory issues. She describes a sensation of a possible yeast infection, with itching and discomfort in the lower urinary tract, particularly when holding urine for extended periods. She reports some vaginal discharge but denies any bleeding.  Additionally, the patient has been experiencing itchy skin rashes.       Outpatient Medications Prior to Visit  Medication Sig Dispense Refill   albuterol (VENTOLIN HFA) 108 (90 Base) MCG/ACT inhaler Inhale 2 puffs into the lungs every 6 (six) hours as needed for wheezing or shortness of breath. 18 g 5   baclofen (LIORESAL) 10 MG tablet Take 1 tablet (10 mg total) by mouth 3 (three) times daily. 30 each 0   Budeson-Glycopyrrol-Formoterol (BREZTRI AEROSPHERE) 160-9-4.8 MCG/ACT AERO Inhale 2 puffs into the lungs 2 (two) times daily. 32.1 g 1   cyclobenzaprine (FLEXERIL) 10 MG tablet Take 1 tablet (10 mg total) by mouth at bedtime. 60 tablet 0   Esomeprazole Magnesium (NEXIUM PO) Take 1 tablet by mouth daily.     famotidine (PEPCID) 10 MG tablet Take 10 mg by mouth.     fluticasone (FLONASE) 50 MCG/ACT nasal spray Use 2 spray(s) in each nostril once daily 48 g 1   irbesartan (AVAPRO) 300 MG tablet Take 1 tablet by mouth once daily  90 tablet 0   levocetirizine (XYZAL) 5 MG tablet Take 1 tablet (5 mg total) by mouth every evening. 90 tablet 2   levothyroxine (SYNTHROID) 75 MCG tablet Take 1 tablet by mouth once daily 90 tablet 0   rosuvastatin (CRESTOR) 10 MG tablet Take 1 tablet (10 mg total) by mouth daily. 90 tablet 1   spironolactone (ALDACTONE) 100 MG tablet Take 1 tablet by mouth once daily 90 tablet 0   diazepam (VALIUM) 5 MG tablet Take one tablet by mouth with food one hour prior to procedure. May repeat 30 minutes prior if needed. 2 tablet 0   empagliflozin (JARDIANCE) 10 MG TABS tablet Take 1 tablet (10 mg total) by mouth daily before breakfast. 90 tablet 0   No facility-administered medications prior to visit.    ROS Review of Systems  Constitutional:  Positive for fatigue. Negative for appetite change and fever.  HENT:  Positive for trouble swallowing. Negative for sore throat.   Respiratory:  Positive for shortness of breath. Negative for cough, chest tightness and wheezing.   Cardiovascular:  Negative for chest pain, palpitations and leg swelling.  Gastrointestinal:  Positive for abdominal pain and constipation. Negative for blood in stool, nausea and vomiting.  Genitourinary:  Positive for vaginal discharge. Negative for difficulty urinating, dysuria, genital sores, menstrual problem, pelvic pain, vaginal bleeding and vaginal pain.  Musculoskeletal:  Positive for gait problem.  Skin: Negative.   Neurological:  Positive for dizziness. Negative for weakness and light-headedness.  Hematological:  Negative for adenopathy. Does not bruise/bleed easily.  Psychiatric/Behavioral:  Positive for confusion and decreased concentration.     Objective:  BP 130/82 (BP Location: Left Arm, Patient Position: Sitting, Cuff Size: Large)   Pulse 66   Temp 98.2 F (36.8 C) (Oral)   Ht 5' (1.524 m)   Wt 180 lb (81.6 kg)   SpO2 97%   BMI 35.15 kg/m   BP Readings from Last 3 Encounters:  08/07/23 130/82  04/02/23  (!) 142/72  03/13/23 138/80    Wt Readings from Last 3 Encounters:  08/07/23 180 lb (81.6 kg)  04/02/23 180 lb (81.6 kg)  03/13/23 182 lb (82.6 kg)    Physical Exam Vitals reviewed. Exam conducted with a chaperone present (Shirron).  Constitutional:      General: She is not in acute distress.    Appearance: Normal appearance. She is not ill-appearing, toxic-appearing or diaphoretic.  HENT:     Mouth/Throat:     Mouth: Mucous membranes are moist.  Eyes:     General: No scleral icterus.    Conjunctiva/sclera: Conjunctivae normal.  Cardiovascular:     Rate and Rhythm: Normal rate and regular rhythm.     Heart sounds: No murmur heard.    No gallop.  Pulmonary:     Effort: Pulmonary effort is normal.     Breath sounds: No stridor. No wheezing, rhonchi or rales.  Abdominal:     General: Abdomen is flat and protuberant. Bowel sounds are decreased. There is no distension.     Palpations: Abdomen is soft. There is no hepatomegaly, splenomegaly or mass.     Tenderness: There is no abdominal tenderness. There is no guarding or rebound.     Hernia: There is no hernia in the left inguinal area or right inguinal area.  Genitourinary:    Exam position: Supine.     Pubic Area: No rash or pubic lice.      Labia:        Right: Rash present. No tenderness, lesion or injury.        Left: Rash present. No tenderness, lesion or injury.      Urethra: No prolapse, urethral pain, urethral swelling or urethral lesion.     Vagina: No signs of injury and foreign body. No vaginal discharge, erythema, tenderness, bleeding, lesions or prolapsed vaginal walls.     Cervix: Normal. No cervical motion tenderness, discharge, friability, lesion, erythema, cervical bleeding or eversion.     Uterus: Normal. Not deviated, not enlarged, not fixed, not tender and no uterine prolapse.      Adnexa: Right adnexa normal.       Right: No mass, tenderness or fullness.         Left: No mass, tenderness or fullness.        Rectum: Guaiac result positive. Internal hemorrhoid present. No mass, tenderness, anal fissure or external hemorrhoid. Normal anal tone.    Musculoskeletal:        General: No tenderness. Normal range of motion.     Cervical back: Neck supple.  Lymphadenopathy:     Cervical: No cervical adenopathy.     Lower Body: No right inguinal adenopathy. No left inguinal adenopathy.  Skin:    General: Skin is warm and dry.     Findings: Erythema and rash present.  Neurological:     General: No focal deficit present.     Mental Status: She is alert. Mental status is at baseline.  Psychiatric:  Mood and Affect: Mood normal.        Thought Content: Thought content normal.        Judgment: Judgment normal.     Lab Results  Component Value Date   WBC 10.0 03/13/2023   HGB 14.0 03/13/2023   HCT 42.0 03/13/2023   PLT 396.0 03/13/2023   GLUCOSE 115 (H) 08/07/2023   CHOL 190 04/02/2023   TRIG 266.0 (H) 04/02/2023   HDL 36.90 (L) 04/02/2023   LDLDIRECT 123.0 04/02/2023   LDLCALC 114 (H) 04/10/2022   ALT 15 03/13/2023   AST 15 03/13/2023   NA 133 (L) 08/07/2023   K 4.3 08/07/2023   CL 97 08/07/2023   CREATININE 1.09 08/07/2023   BUN 21 08/07/2023   CO2 29 08/07/2023   TSH 2.11 08/07/2023   HGBA1C 6.4 08/07/2023   MICROALBUR <0.7 12/26/2022    CT CHEST LUNG CA SCREEN LOW DOSE W/O CM  Result Date: 07/09/2023 CLINICAL DATA:  57 year old female with 30 pack-year history of smoking. Lung cancer screening. EXAM: CT CHEST WITHOUT CONTRAST LOW-DOSE FOR LUNG CANCER SCREENING TECHNIQUE: Multidetector CT imaging of the chest was performed following the standard protocol without IV contrast. RADIATION DOSE REDUCTION: This exam was performed according to the departmental dose-optimization program which includes automated exposure control, adjustment of the mA and/or kV according to patient size and/or use of iterative reconstruction technique. COMPARISON:  06/27/2022 FINDINGS: Cardiovascular:  The heart size is normal. No substantial pericardial effusion. No thoracic aortic aneurysm. No substantial atherosclerosis of the thoracic aorta. Mediastinum/Nodes: No mediastinal lymphadenopathy. No evidence for gross hilar lymphadenopathy although assessment is limited by the lack of intravenous contrast on the current study. The esophagus has normal imaging features. There is no axillary lymphadenopathy. Lungs/Pleura: Centrilobular and paraseptal emphysema evident. No new suspicious pulmonary nodule or mass. No focal airspace consolidation. No pleural effusion. Scattered areas of subsegmental atelectasis or linear scarring are noted in the lower lungs bilaterally, similar to prior. Upper Abdomen: Visualized portion of the upper abdomen is unremarkable. Musculoskeletal: No worrisome lytic or sclerotic osseous abnormality. IMPRESSION: 1. Lung-RADS 1, negative. Continue annual screening with low-dose chest CT without contrast in 12 months. 2.  Emphysema (ICD10-J43.9). Electronically Signed   By: Kennith Center M.D.   On: 07/09/2023 10:28    Assessment & Plan:   Acquired hypothyroidism - She is euthyroid. -     TSH; Future  Essential hypertension -     Urinalysis, Routine w reflex microscopic; Future -     TSH; Future -     Basic metabolic panel; Future  Type II diabetes mellitus with manifestations (HCC)- Her blood sugar is well controlled. -     Urinalysis, Routine w reflex microscopic; Future -     Hemoglobin A1c; Future -     Basic metabolic panel; Future  Cervical cancer screening -     Cytology - PAP  Vaginal discharge -     Cytology - PAP  Intrinsic eczema -     Fluocinonide Emulsified Base; Apply 1 Application topically 2 (two) times daily.  Dispense: 60 g; Refill: 2  Stage 3a chronic kidney disease (HCC) -     Urinalysis, Routine w reflex microscopic; Future -     Basic metabolic panel; Future  Flu vaccine need -     Flu vaccine trivalent PF, 6mos and  older(Flulaval,Afluria,Fluarix,Fluzone)  Yeast vaginitis - Will discontinue the SGLT-2 inh. -     Fluconazole; Take 1 tablet (150 mg total) by mouth once for 1  dose.  Dispense: 1 tablet; Refill: 3 -     Cytology - PAP  Hypercalcemia -     Ambulatory referral to Endocrinology  Other orders -     Tdap vaccine greater than or equal to 7yo IM     Follow-up: Return in about 3 months (around 11/07/2023).  Sanda Linger, MD

## 2023-08-07 NOTE — Patient Instructions (Signed)

## 2023-08-08 DIAGNOSIS — B3731 Acute candidiasis of vulva and vagina: Secondary | ICD-10-CM | POA: Insufficient documentation

## 2023-08-08 DIAGNOSIS — Z23 Encounter for immunization: Secondary | ICD-10-CM | POA: Insufficient documentation

## 2023-08-08 LAB — TSH: TSH: 2.11 u[IU]/mL (ref 0.35–5.50)

## 2023-08-09 ENCOUNTER — Other Ambulatory Visit: Payer: Self-pay | Admitting: "Endocrinology

## 2023-08-09 ENCOUNTER — Telehealth: Payer: Self-pay | Admitting: "Endocrinology

## 2023-08-09 NOTE — Telephone Encounter (Signed)
Pt made aware

## 2023-08-09 NOTE — Telephone Encounter (Signed)
Patient called and said her PCP needs her to be seen URGENTLY due to her Calcium Levels. Please review chart and let me know if you want to see her sooner than 11/25 thanks

## 2023-08-13 ENCOUNTER — Other Ambulatory Visit: Payer: Self-pay | Admitting: Internal Medicine

## 2023-08-13 LAB — CYTOLOGY - PAP
Adequacy: ABSENT
Chlamydia: NEGATIVE
Comment: NEGATIVE
Comment: NEGATIVE
Comment: NEGATIVE
Comment: NEGATIVE
Comment: NORMAL
Diagnosis: NEGATIVE
HSV1: NEGATIVE
HSV2: NEGATIVE
High risk HPV: NEGATIVE
Neisseria Gonorrhea: NEGATIVE
Trichomonas: NEGATIVE

## 2023-08-14 ENCOUNTER — Telehealth: Payer: Self-pay | Admitting: Internal Medicine

## 2023-08-14 NOTE — Telephone Encounter (Signed)
Patient spouse dropped off document FMLA, to be filled out by provider. Patient spouse requested to send it back via Call Patient spouse to pick up within 7-days. Document is located in providers tray at front office.Please advise at [phone]

## 2023-08-16 NOTE — Telephone Encounter (Signed)
LVM for pt. Needing clarification on what conditions the FMLA is for.

## 2023-08-19 NOTE — Telephone Encounter (Signed)
LVM for pt to follow up with the [facility] paperwork.

## 2023-08-21 NOTE — Telephone Encounter (Signed)
LVM To return call in regards to [facility]

## 2023-08-22 DIAGNOSIS — I10 Essential (primary) hypertension: Secondary | ICD-10-CM | POA: Diagnosis not present

## 2023-08-22 DIAGNOSIS — E7800 Pure hypercholesterolemia, unspecified: Secondary | ICD-10-CM | POA: Diagnosis not present

## 2023-08-22 DIAGNOSIS — Z780 Asymptomatic menopausal state: Secondary | ICD-10-CM | POA: Diagnosis not present

## 2023-08-22 DIAGNOSIS — E1165 Type 2 diabetes mellitus with hyperglycemia: Secondary | ICD-10-CM | POA: Diagnosis not present

## 2023-08-26 ENCOUNTER — Telehealth: Payer: Self-pay | Admitting: Internal Medicine

## 2023-08-26 NOTE — Telephone Encounter (Signed)
See updated noted from 08/26/2023

## 2023-08-26 NOTE — Telephone Encounter (Signed)
Spoke with patient and received clarification in regards to what's needed for the [facility]. Will discuss with Dr. Yetta Barre and finish.

## 2023-08-26 NOTE — Telephone Encounter (Signed)
Pt is calling back about her FMLA paperwork and wondering if its completed.  Please advise. PT CB # [phone]

## 2023-08-27 NOTE — Progress Notes (Unsigned)
Subjective:    Patient ID: Sarah Watts, female    DOB: 1966/03/20, 57 y.o.   MRN: [id number]      HPI Sarah Watts is here for No chief complaint on file.   She is here for an acute visit for cold symptoms.   She has a history of COPD.    Her symptoms started   She is experiencing   She has tried taking     she is using her Breztri daily and albuterol inhaler.      Medications and allergies reviewed with patient and updated if appropriate.  Current Outpatient Medications on File Prior to Visit  Medication Sig Dispense Refill   albuterol (VENTOLIN HFA) 108 (90 Base) MCG/ACT inhaler Inhale 2 puffs into the lungs every 6 (six) hours as needed for wheezing or shortness of breath. 18 g 5   baclofen (LIORESAL) 10 MG tablet Take 1 tablet (10 mg total) by mouth 3 (three) times daily. 30 each 0   Budeson-Glycopyrrol-Formoterol (BREZTRI AEROSPHERE) 160-9-4.8 MCG/ACT AERO Inhale 2 puffs into the lungs 2 (two) times daily. 32.1 g 1   cyclobenzaprine (FLEXERIL) 10 MG tablet Take 1 tablet (10 mg total) by mouth at bedtime. 60 tablet 0   Esomeprazole Magnesium (NEXIUM PO) Take 1 tablet by mouth daily.     famotidine (PEPCID) 10 MG tablet Take 10 mg by mouth.     fluocinonide-emollient (LIDEX-E) 0.05 % cream Apply 1 Application topically 2 (two) times daily. 60 g 2   fluticasone (FLONASE) 50 MCG/ACT nasal spray Use 2 spray(s) in each nostril once daily 48 g 1   irbesartan (AVAPRO) 300 MG tablet Take 1 tablet by mouth once daily 90 tablet 0   levocetirizine (XYZAL) 5 MG tablet Take 1 tablet (5 mg total) by mouth every evening. 90 tablet 2   levothyroxine (SYNTHROID) 75 MCG tablet Take 1 tablet by mouth once daily 90 tablet 0   rosuvastatin (CRESTOR) 10 MG tablet Take 1 tablet (10 mg total) by mouth daily. 90 tablet 1   spironolactone (ALDACTONE) 100 MG tablet Take 1 tablet by mouth once daily 90 tablet 0   No current facility-administered medications on file prior to visit.    Review  of Systems     Objective:  There were no vitals filed for this visit. BP Readings from Last 3 Encounters:  08/07/23 130/82  04/02/23 (!) 142/72  03/13/23 138/80   Wt Readings from Last 3 Encounters:  08/07/23 180 lb (81.6 kg)  04/02/23 180 lb (81.6 kg)  03/13/23 182 lb (82.6 kg)   There is no height or weight on file to calculate BMI.    Physical Exam Constitutional:      General: She is not in acute distress.    Appearance: Normal appearance. She is not ill-appearing.  HENT:     Head: Normocephalic and atraumatic.     Right Ear: Tympanic membrane, ear canal and external ear normal.     Left Ear: Tympanic membrane, ear canal and external ear normal.     Mouth/Throat:     Mouth: Mucous membranes are moist.     Pharynx: No oropharyngeal exudate or posterior oropharyngeal erythema.  Eyes:     Conjunctiva/sclera: Conjunctivae normal.  Cardiovascular:     Rate and Rhythm: Normal rate and regular rhythm.  Pulmonary:     Effort: Pulmonary effort is normal. No respiratory distress.     Breath sounds: Normal breath sounds. No wheezing or rales.  Musculoskeletal:  Cervical back: Neck supple. No tenderness.  Lymphadenopathy:     Cervical: No cervical adenopathy.  Skin:    General: Skin is warm and dry.  Neurological:     Mental Status: She is alert.            Assessment & Plan:    See Problem List for Assessment and Plan of chronic medical problems.

## 2023-08-27 NOTE — Patient Instructions (Addendum)
     Medications changes include :   cough syrup, augmentin and eye drops     Return if symptoms worsen or fail to improve.  

## 2023-08-28 ENCOUNTER — Ambulatory Visit (INDEPENDENT_AMBULATORY_CARE_PROVIDER_SITE_OTHER): Payer: BC Managed Care – PPO | Admitting: Internal Medicine

## 2023-08-28 ENCOUNTER — Encounter: Payer: Self-pay | Admitting: Internal Medicine

## 2023-08-28 VITALS — BP 132/76 | HR 67 | Temp 98.0°F | Ht 60.0 in | Wt 188.0 lb

## 2023-08-28 DIAGNOSIS — J441 Chronic obstructive pulmonary disease with (acute) exacerbation: Secondary | ICD-10-CM

## 2023-08-28 DIAGNOSIS — I10 Essential (primary) hypertension: Secondary | ICD-10-CM | POA: Diagnosis not present

## 2023-08-28 DIAGNOSIS — J069 Acute upper respiratory infection, unspecified: Secondary | ICD-10-CM | POA: Diagnosis not present

## 2023-08-28 LAB — POC INFLUENZA A&B (BINAX/QUICKVUE)
Influenza A, POC: NEGATIVE
Influenza B, POC: NEGATIVE

## 2023-08-28 LAB — POC COVID19 BINAXNOW: SARS Coronavirus 2 Ag: NEGATIVE

## 2023-08-28 MED ORDER — PREDNISONE 20 MG PO TABS: 40.0000 mg | ORAL_TABLET | Freq: Every day | ORAL | 0 refills | Status: AC

## 2023-08-28 MED ORDER — AZITHROMYCIN 250 MG PO TABS: ORAL_TABLET | ORAL | 0 refills | Status: DC

## 2023-08-28 MED ORDER — PROMETHAZINE-DM 6.25-15 MG/5ML PO SYRP: 5.0000 mL | ORAL_SOLUTION | Freq: Four times a day (QID) | ORAL | 0 refills | Status: DC | PRN

## 2023-08-28 NOTE — Addendum Note (Signed)
Addended by: Karma Ganja on: 08/28/2023 09:48 AM   Modules accepted: Orders

## 2023-08-29 DIAGNOSIS — Z0279 Encounter for issue of other medical certificate: Secondary | ICD-10-CM

## 2023-08-29 NOTE — Telephone Encounter (Signed)
FMLA has been completed. Patient is aware and will pick up in the morning. FMLA Placed in the front.

## 2023-08-30 NOTE — Telephone Encounter (Signed)
Pt has picked up forms

## 2023-09-23 ENCOUNTER — Other Ambulatory Visit: Payer: Self-pay | Admitting: Internal Medicine

## 2023-09-23 DIAGNOSIS — E039 Hypothyroidism, unspecified: Secondary | ICD-10-CM

## 2023-09-23 DIAGNOSIS — E785 Hyperlipidemia, unspecified: Secondary | ICD-10-CM

## 2023-09-24 ENCOUNTER — Ambulatory Visit: Payer: BC Managed Care – PPO | Admitting: "Endocrinology

## 2023-09-24 ENCOUNTER — Ambulatory Visit: Payer: BC Managed Care – PPO | Admitting: Internal Medicine

## 2023-09-28 ENCOUNTER — Other Ambulatory Visit: Payer: Self-pay | Admitting: Internal Medicine

## 2023-09-28 DIAGNOSIS — I10 Essential (primary) hypertension: Secondary | ICD-10-CM

## 2023-09-28 DIAGNOSIS — J301 Allergic rhinitis due to pollen: Secondary | ICD-10-CM

## 2023-10-19 ENCOUNTER — Other Ambulatory Visit: Payer: Self-pay | Admitting: "Endocrinology

## 2023-10-19 DIAGNOSIS — I10 Essential (primary) hypertension: Secondary | ICD-10-CM

## 2023-10-19 DIAGNOSIS — E269 Hyperaldosteronism, unspecified: Secondary | ICD-10-CM

## 2023-10-21 ENCOUNTER — Other Ambulatory Visit: Payer: Self-pay | Admitting: "Endocrinology

## 2023-10-21 DIAGNOSIS — E269 Hyperaldosteronism, unspecified: Secondary | ICD-10-CM

## 2023-10-21 DIAGNOSIS — I10 Essential (primary) hypertension: Secondary | ICD-10-CM

## 2023-10-24 MED ORDER — SPIRONOLACTONE 100 MG PO TABS: 100.0000 mg | ORAL_TABLET | Freq: Every day | ORAL | 0 refills | Status: DC

## 2023-10-25 ENCOUNTER — Other Ambulatory Visit (HOSPITAL_COMMUNITY): Payer: Self-pay | Admitting: Endocrinology

## 2023-10-25 DIAGNOSIS — E210 Primary hyperparathyroidism: Secondary | ICD-10-CM

## 2023-10-29 ENCOUNTER — Telehealth: Payer: Self-pay

## 2023-10-29 NOTE — Telephone Encounter (Signed)
Left a message requesting pt return call to the office. 

## 2023-11-11 DIAGNOSIS — I10 Essential (primary) hypertension: Secondary | ICD-10-CM | POA: Diagnosis not present

## 2023-11-11 DIAGNOSIS — E7800 Pure hypercholesterolemia, unspecified: Secondary | ICD-10-CM | POA: Diagnosis not present

## 2023-11-11 DIAGNOSIS — E1165 Type 2 diabetes mellitus with hyperglycemia: Secondary | ICD-10-CM | POA: Diagnosis not present

## 2023-11-11 DIAGNOSIS — E210 Primary hyperparathyroidism: Secondary | ICD-10-CM | POA: Diagnosis not present

## 2023-11-28 DIAGNOSIS — E1165 Type 2 diabetes mellitus with hyperglycemia: Secondary | ICD-10-CM | POA: Diagnosis not present

## 2023-12-18 ENCOUNTER — Ambulatory Visit: Payer: BC Managed Care – PPO | Admitting: Internal Medicine

## 2023-12-25 ENCOUNTER — Other Ambulatory Visit: Payer: Self-pay | Admitting: Internal Medicine

## 2023-12-25 DIAGNOSIS — J301 Allergic rhinitis due to pollen: Secondary | ICD-10-CM

## 2023-12-25 DIAGNOSIS — E039 Hypothyroidism, unspecified: Secondary | ICD-10-CM

## 2023-12-25 DIAGNOSIS — I10 Essential (primary) hypertension: Secondary | ICD-10-CM

## 2023-12-31 ENCOUNTER — Ambulatory Visit: Payer: BC Managed Care – PPO | Admitting: Internal Medicine

## 2023-12-31 ENCOUNTER — Encounter: Payer: Self-pay | Admitting: Internal Medicine

## 2023-12-31 VITALS — BP 176/72 | HR 65 | Temp 97.7°F | Resp 16 | Ht 60.0 in | Wt 188.8 lb

## 2023-12-31 DIAGNOSIS — E269 Hyperaldosteronism, unspecified: Secondary | ICD-10-CM

## 2023-12-31 DIAGNOSIS — N1831 Chronic kidney disease, stage 3a: Secondary | ICD-10-CM

## 2023-12-31 DIAGNOSIS — E785 Hyperlipidemia, unspecified: Secondary | ICD-10-CM

## 2023-12-31 DIAGNOSIS — E538 Deficiency of other specified B group vitamins: Secondary | ICD-10-CM

## 2023-12-31 DIAGNOSIS — J301 Allergic rhinitis due to pollen: Secondary | ICD-10-CM

## 2023-12-31 DIAGNOSIS — E118 Type 2 diabetes mellitus with unspecified complications: Secondary | ICD-10-CM

## 2023-12-31 DIAGNOSIS — I10 Essential (primary) hypertension: Secondary | ICD-10-CM | POA: Diagnosis not present

## 2023-12-31 DIAGNOSIS — E213 Hyperparathyroidism, unspecified: Secondary | ICD-10-CM

## 2023-12-31 DIAGNOSIS — E039 Hypothyroidism, unspecified: Secondary | ICD-10-CM

## 2023-12-31 DIAGNOSIS — J432 Centrilobular emphysema: Secondary | ICD-10-CM

## 2023-12-31 LAB — HEMOGLOBIN A1C: Hgb A1c MFr Bld: 6.4 % (ref 4.6–6.5)

## 2023-12-31 LAB — CBC WITH DIFFERENTIAL/PLATELET
Basophils Absolute: 0.1 10*3/uL (ref 0.0–0.1)
Basophils Relative: 1.1 % (ref 0.0–3.0)
Eosinophils Absolute: 0.2 10*3/uL (ref 0.0–0.7)
Eosinophils Relative: 2.5 % (ref 0.0–5.0)
HCT: 42.7 % (ref 36.0–46.0)
Hemoglobin: 14.1 g/dL (ref 12.0–15.0)
Lymphocytes Relative: 25.7 % (ref 12.0–46.0)
Lymphs Abs: 1.9 10*3/uL (ref 0.7–4.0)
MCHC: 32.9 g/dL (ref 30.0–36.0)
MCV: 87.7 fl (ref 78.0–100.0)
Monocytes Absolute: 0.6 10*3/uL (ref 0.1–1.0)
Monocytes Relative: 7.4 % (ref 3.0–12.0)
Neutro Abs: 4.8 10*3/uL (ref 1.4–7.7)
Neutrophils Relative %: 63.3 % (ref 43.0–77.0)
Platelets: 349 10*3/uL (ref 150.0–400.0)
RBC: 4.87 Mil/uL (ref 3.87–5.11)
RDW: 13.4 % (ref 11.5–15.5)
WBC: 7.6 10*3/uL (ref 4.0–10.5)

## 2023-12-31 LAB — BASIC METABOLIC PANEL
BUN: 10 mg/dL (ref 6–23)
CO2: 28 meq/L (ref 19–32)
Calcium: 10.1 mg/dL (ref 8.4–10.5)
Chloride: 105 meq/L (ref 96–112)
Creatinine, Ser: 0.93 mg/dL (ref 0.40–1.20)
GFR: 67.59 mL/min (ref >60.00)
Glucose, Bld: 105 mg/dL — ABNORMAL HIGH (ref 70–99)
Potassium: 4.3 meq/L (ref 3.5–5.1)
Sodium: 140 meq/L (ref 135–145)

## 2023-12-31 LAB — TSH: TSH: 1.17 u[IU]/mL (ref 0.35–5.50)

## 2023-12-31 MED ORDER — BREZTRI AEROSPHERE 160-9-4.8 MCG/ACT IN AERO: 2.0000 | INHALATION_SPRAY | Freq: Two times a day (BID) | RESPIRATORY_TRACT | 1 refills | Status: DC

## 2023-12-31 MED ORDER — CYANOCOBALAMIN 1000 MCG/ML IJ SOLN
1000.0000 ug | Freq: Once | INTRAMUSCULAR | Status: AC
  Administered 2023-12-31: 1000 ug via INTRAMUSCULAR

## 2023-12-31 MED ORDER — ROSUVASTATIN CALCIUM 10 MG PO TABS
10.0000 mg | ORAL_TABLET | Freq: Every day | ORAL | 0 refills | Status: DC
Start: 2023-12-31 — End: 2024-04-02

## 2023-12-31 NOTE — Patient Instructions (Signed)
 Hypertension, Adult High blood pressure (hypertension) is when the force of blood pumping through the arteries is too strong. The arteries are the blood vessels that carry blood from the heart throughout the body. Hypertension forces the heart to work harder to pump blood and may cause arteries to become narrow or stiff. Untreated or uncontrolled hypertension can lead to a heart attack, heart failure, a stroke, kidney disease, and other problems. A blood pressure reading consists of a higher number over a lower number. Ideally, your blood pressure should be below 120/80. The first ("top") number is called the systolic pressure. It is a measure of the pressure in your arteries as your heart beats. The second ("bottom") number is called the diastolic pressure. It is a measure of the pressure in your arteries as the heart relaxes. What are the causes? The exact cause of this condition is not known. There are some conditions that result in high blood pressure. What increases the risk? Certain factors may make you more likely to develop high blood pressure. Some of these risk factors are under your control, including: Smoking. Not getting enough exercise or physical activity. Being overweight. Having too much fat, sugar, calories, or salt (sodium) in your diet. Drinking too much alcohol. Other risk factors include: Having a personal history of heart disease, diabetes, high cholesterol, or kidney disease. Stress. Having a family history of high blood pressure and high cholesterol. Having obstructive sleep apnea. Age. The risk increases with age. What are the signs or symptoms? High blood pressure may not cause symptoms. Very high blood pressure (hypertensive crisis) may cause: Headache. Fast or irregular heartbeats (palpitations). Shortness of breath. Nosebleed. Nausea and vomiting. Vision changes. Severe chest pain, dizziness, and seizures. How is this diagnosed? This condition is diagnosed by  measuring your blood pressure while you are seated, with your arm resting on a flat surface, your legs uncrossed, and your feet flat on the floor. The cuff of the blood pressure monitor will be placed directly against the skin of your upper arm at the level of your heart. Blood pressure should be measured at least twice using the same arm. Certain conditions can cause a difference in blood pressure between your right and left arms. If you have a high blood pressure reading during one visit or you have normal blood pressure with other risk factors, you may be asked to: Return on a different day to have your blood pressure checked again. Monitor your blood pressure at home for 1 week or longer. If you are diagnosed with hypertension, you may have other blood or imaging tests to help your health care provider understand your overall risk for other conditions. How is this treated? This condition is treated by making healthy lifestyle changes, such as eating healthy foods, exercising more, and reducing your alcohol intake. You may be referred for counseling on a healthy diet and physical activity. Your health care provider may prescribe medicine if lifestyle changes are not enough to get your blood pressure under control and if: Your systolic blood pressure is above 130. Your diastolic blood pressure is above 80. Your personal target blood pressure may vary depending on your medical conditions, your age, and other factors. Follow these instructions at home: Eating and drinking  Eat a diet that is high in fiber and potassium, and low in sodium, added sugar, and fat. An example of this eating plan is called the DASH diet. DASH stands for Dietary Approaches to Stop Hypertension. To eat this way: Eat  plenty of fresh fruits and vegetables. Try to fill one half of your plate at each meal with fruits and vegetables. Eat whole grains, such as whole-wheat pasta, brown rice, or whole-grain bread. Fill about one  fourth of your plate with whole grains. Eat or drink low-fat dairy products, such as skim milk or low-fat yogurt. Avoid fatty cuts of meat, processed or cured meats, and poultry with skin. Fill about one fourth of your plate with lean proteins, such as fish, chicken without skin, beans, eggs, or tofu. Avoid pre-made and processed foods. These tend to be higher in sodium, added sugar, and fat. Reduce your daily sodium intake. Many people with hypertension should eat less than 1,500 mg of sodium a day. Do not drink alcohol if: Your health care provider tells you not to drink. You are pregnant, may be pregnant, or are planning to become pregnant. If you drink alcohol: Limit how much you have to: 0-1 drink a day for women. 0-2 drinks a day for men. Know how much alcohol is in your drink. In the [place] one drink equals one 12 oz bottle of beer (355 mL), one 5 oz glass of wine (148 mL), or one 1 oz glass of hard liquor (44 mL). Lifestyle  Work with your health care provider to maintain a healthy body weight or to lose weight. Ask what an ideal weight is for you. Get at least 30 minutes of exercise that causes your heart to beat faster (aerobic exercise) most days of the week. Activities may include walking, swimming, or biking. Include exercise to strengthen your muscles (resistance exercise), such as Pilates or lifting weights, as part of your weekly exercise routine. Try to do these types of exercises for 30 minutes at least 3 days a week. Do not use any products that contain nicotine or tobacco. These products include cigarettes, chewing tobacco, and vaping devices, such as e-cigarettes. If you need help quitting, ask your health care provider. Monitor your blood pressure at home as told by your health care provider. Keep all follow-up visits. This is important. Medicines Take over-the-counter and prescription medicines only as told by your health care provider. Follow directions carefully. Blood  pressure medicines must be taken as prescribed. Do not skip doses of blood pressure medicine. Doing this puts you at risk for problems and can make the medicine less effective. Ask your health care provider about side effects or reactions to medicines that you should watch for. Contact a health care provider if you: Think you are having a reaction to a medicine you are taking. Have headaches that keep coming back (recurring). Feel dizzy. Have swelling in your ankles. Have trouble with your vision. Get help right away if you: Develop a severe headache or confusion. Have unusual weakness or numbness. Feel faint. Have severe pain in your chest or abdomen. Vomit repeatedly. Have trouble breathing. These symptoms may be an emergency. Get help right away. Call 911. Do not wait to see if the symptoms will go away. Do not drive yourself to the hospital. Summary Hypertension is when the force of blood pumping through your arteries is too strong. If this condition is not controlled, it may put you at risk for serious complications. Your personal target blood pressure may vary depending on your medical conditions, your age, and other factors. For most people, a normal blood pressure is less than 120/80. Hypertension is treated with lifestyle changes, medicines, or a combination of both. Lifestyle changes include losing weight, eating a healthy,  low-sodium diet, exercising more, and limiting alcohol. This information is not intended to replace advice given to you by your health care provider. Make sure you discuss any questions you have with your health care provider. Document Revised: 08/22/2021 Document Reviewed: 08/22/2021 Elsevier Patient Education  2024 [employer].

## 2023-12-31 NOTE — Progress Notes (Unsigned)
 Subjective:  Patient ID: Sarah Watts, female    DOB: 05-Feb-1966  Age: 58 y.o. MRN: [id number]  CC: Hyperlipidemia, Hypertension, Hypothyroidism, and Diabetes   HPI Sarah Watts presents for f/up ----  Discussed the use of AI scribe software for clinical note transcription with the patient, who gave verbal consent to proceed.  History of Present Illness   Sarah Watts is a 58 year old female with hypertension who presents with elevated blood pressure and knee pain.  She has been experiencing elevated blood pressure due to difficulty obtaining her spironolactone prescription, which was not filled by her endocrinologist about a month ago. This has resulted in headaches and blurred vision. No chest pain is present, but she describes being 'always short of breath' with some congestion and cough, though not significantly short of breath currently. She uses Breztri sparingly due to insurance issues.  She has been experiencing left knee pain for the past two to three months, which worsens with weight-bearing. She suspects arthritis as the cause, having had similar issues before, and denies any recent injury to the knee. She occasionally takes Advil for pain relief.  She experiences chronic diarrhea, which she describes as 'vile,' and occasionally has constipation that leads to rectal bleeding. She does not take any medication for these symptoms.  She has a history of primary hyperparathyroidism diagnosed by her endocrinologist, who suggested dietary and exercise management. She does not enjoy exercise but is open to walking, though her knee pain limits her activity.       Outpatient Medications Prior to Visit  Medication Sig Dispense Refill   albuterol (VENTOLIN HFA) 108 (90 Base) MCG/ACT inhaler Inhale 2 puffs into the lungs every 6 (six) hours as needed for wheezing or shortness of breath. 18 g 5   baclofen (LIORESAL) 10 MG tablet Take 1 tablet (10 mg total) by mouth 3 (three)  times daily. 30 each 0   Esomeprazole Magnesium (NEXIUM PO) Take 1 tablet by mouth daily.     famotidine (PEPCID) 10 MG tablet Take 10 mg by mouth.     fluocinonide-emollient (LIDEX-E) 0.05 % cream Apply 1 Application topically 2 (two) times daily. 60 g 2   fluticasone (FLONASE) 50 MCG/ACT nasal spray Use 2 spray(s) in each nostril once daily 48 g 1   irbesartan (AVAPRO) 300 MG tablet Take 1 tablet by mouth once daily 90 tablet 0   levothyroxine (SYNTHROID) 75 MCG tablet Take 1 tablet by mouth once daily 90 tablet 0   spironolactone (ALDACTONE) 100 MG tablet Take 1 tablet (100 mg total) by mouth daily. 30 tablet 0   azithromycin (ZITHROMAX) 250 MG tablet Take two tabs the first day and then one tab daily for four days 6 tablet 0   Budeson-Glycopyrrol-Formoterol (BREZTRI AEROSPHERE) 160-9-4.8 MCG/ACT AERO Inhale 2 puffs into the lungs 2 (two) times daily. 32.1 g 1   cyclobenzaprine (FLEXERIL) 10 MG tablet Take 1 tablet (10 mg total) by mouth at bedtime. 60 tablet 0   levocetirizine (XYZAL) 5 MG tablet TAKE 1 TABLET BY MOUTH ONCE DAILY IN THE EVENING 90 tablet 0   promethazine-dextromethorphan (PROMETHAZINE-DM) 6.25-15 MG/5ML syrup Take 5 mLs by mouth 4 (four) times daily as needed. 150 mL 0   rosuvastatin (CRESTOR) 10 MG tablet Take 1 tablet by mouth once daily 90 tablet 0   No facility-administered medications prior to visit.    ROS Review of Systems  Psychiatric/Behavioral:  Positive for confusion and decreased concentration. The patient is not nervous/anxious.  Objective:  BP (!) 176/72 (BP Location: Left Arm, Patient Position: Sitting, Cuff Size: Large) Comment: BP (R) 178/80  Pulse 65   Temp 97.7 F (36.5 C) (Oral)   Resp 16   Ht 5' (1.524 m)   Wt 188 lb 12.8 oz (85.6 kg)   SpO2 97%   BMI 36.87 kg/m   BP Readings from Last 3 Encounters:  12/31/23 (!) 176/72  08/28/23 132/76  08/07/23 130/82    Wt Readings from Last 3 Encounters:  12/31/23 188 lb 12.8 oz (85.6 kg)   08/28/23 188 lb (85.3 kg)  08/07/23 180 lb (81.6 kg)    Physical Exam Cardiovascular:     Rate and Rhythm: Normal rate and regular rhythm.     Heart sounds: Normal heart sounds, S1 normal and S2 normal.     Comments: EKG----  NSR, 61 bpm RBBB No LVH, Q waves, or ST/T wave changes  Unchanged  Musculoskeletal:     Right lower leg: No edema.     Left lower leg: No edema.     Lab Results  Component Value Date   WBC 7.6 12/31/2023   HGB 14.1 12/31/2023   HCT 42.7 12/31/2023   PLT 349.0 12/31/2023   GLUCOSE 105 (H) 12/31/2023   CHOL 190 04/02/2023   TRIG 266.0 (H) 04/02/2023   HDL 36.90 (L) 04/02/2023   LDLDIRECT 123.0 04/02/2023   LDLCALC 114 (H) 04/10/2022   ALT 15 03/13/2023   AST 15 03/13/2023   NA 140 12/31/2023   K 4.3 12/31/2023   CL 105 12/31/2023   CREATININE 0.93 12/31/2023   BUN 10 12/31/2023   CO2 28 12/31/2023   TSH 1.17 12/31/2023   HGBA1C 6.4 12/31/2023   MICROALBUR 0.8 12/31/2023    No results found.  Assessment & Plan:  Essential hypertension -     Basic metabolic panel; Future -     CBC with Differential/Platelet; Future -     TSH; Future -     Urinalysis, Routine w reflex microscopic; Future -     EKG 12-Lead -     AMB Referral [facility] Care Management  Centrilobular emphysema (HCC) -     Breztri Aerosphere; Inhale 2 puffs into the lungs 2 (two) times daily.  Dispense: 32.1 g; Refill: 1 -     AMB Referral VBCI Care Management  Type II diabetes mellitus with manifestations (HCC) -     Basic metabolic panel; Future -     Urinalysis, Routine w reflex microscopic; Future -     Hemoglobin A1c; Future -     Microalbumin / creatinine urine ratio; Future -     Ambulatory referral to Ophthalmology  Dyslipidemia, goal LDL below 100 -     Rosuvastatin Calcium; Take 1 tablet (10 mg total) by mouth daily.  Dispense: 90 tablet; Refill: 0 -     TSH; Future  Stage 3a chronic kidney disease (HCC) -     Basic metabolic panel; Future -     Urinalysis,  Routine w reflex microscopic; Future -     Microalbumin / creatinine urine ratio; Future  Hyperparathyroidism (HCC)  B12 deficiency -     Cyanocobalamin  Non-seasonal allergic rhinitis due to pollen -     Levocetirizine Dihydrochloride; Take 1 tablet (5 mg total) by mouth every evening.  Dispense: 90 tablet; Refill: 0  Seasonal allergic rhinitis due to pollen -     Levocetirizine Dihydrochloride; Take 1 tablet (5 mg total) by mouth every evening.  Dispense: 90  tablet; Refill: 0     Follow-up: Return in about 3 months (around 04/01/2024).  Sanda Linger, MD

## 2024-01-01 ENCOUNTER — Other Ambulatory Visit: Payer: Self-pay | Admitting: Internal Medicine

## 2024-01-01 ENCOUNTER — Other Ambulatory Visit: Payer: Self-pay | Admitting: "Endocrinology

## 2024-01-01 DIAGNOSIS — I10 Essential (primary) hypertension: Secondary | ICD-10-CM

## 2024-01-01 DIAGNOSIS — E039 Hypothyroidism, unspecified: Secondary | ICD-10-CM

## 2024-01-01 DIAGNOSIS — E269 Hyperaldosteronism, unspecified: Secondary | ICD-10-CM

## 2024-01-01 LAB — URINALYSIS, ROUTINE W REFLEX MICROSCOPIC
Bilirubin Urine: NEGATIVE
Hgb urine dipstick: NEGATIVE
Ketones, ur: NEGATIVE
Nitrite: NEGATIVE
Specific Gravity, Urine: 1.02 (ref 1.000–1.030)
Total Protein, Urine: NEGATIVE
Urine Glucose: NEGATIVE
Urobilinogen, UA: 0.2 (ref 0.0–1.0)
pH: 6 (ref 5.0–8.0)

## 2024-01-01 LAB — MICROALBUMIN / CREATININE URINE RATIO
Creatinine,U: 165 mg/dL
Microalb Creat Ratio: 5 mg/g (ref 0.0–30.0)
Microalb, Ur: 0.8 mg/dL (ref 0.0–1.9)

## 2024-01-01 MED ORDER — LEVOTHYROXINE SODIUM 75 MCG PO TABS: 75.0000 ug | ORAL_TABLET | Freq: Every day | ORAL | 0 refills | Status: DC

## 2024-01-01 MED ORDER — SPIRONOLACTONE 50 MG PO TABS: 50.0000 mg | ORAL_TABLET | Freq: Every day | ORAL | 0 refills | Status: DC

## 2024-01-01 MED ORDER — IRBESARTAN 300 MG PO TABS: 300.0000 mg | ORAL_TABLET | Freq: Every day | ORAL | 0 refills | Status: DC

## 2024-01-01 MED ORDER — LEVOCETIRIZINE DIHYDROCHLORIDE 5 MG PO TABS: 5.0000 mg | ORAL_TABLET | Freq: Every evening | ORAL | 0 refills | Status: DC

## 2024-01-02 MED ORDER — LEVOTHYROXINE SODIUM 75 MCG PO TABS: 75.0000 ug | ORAL_TABLET | Freq: Every day | ORAL | 0 refills | Status: DC

## 2024-01-02 MED ORDER — IRBESARTAN 300 MG PO TABS: 300.0000 mg | ORAL_TABLET | Freq: Every day | ORAL | 0 refills | Status: DC

## 2024-01-03 ENCOUNTER — Encounter: Payer: Self-pay | Admitting: Internal Medicine

## 2024-01-03 ENCOUNTER — Telehealth: Payer: Self-pay | Admitting: *Deleted

## 2024-01-03 NOTE — Progress Notes (Signed)
 Care Guide Pharmacy Note  01/03/2024 Name: Sarah Watts MRN: [id number] DOB: [birth year]  Referred By: Etta Grandchild, MD Reason for referral: Care Coordination (Outreach to schedule referral with pharmacist )   Sarah Watts is a 58 y.o. year old female who is a primary care patient of Etta Grandchild, MD.  Welton Flakes was referred to the pharmacist for assistance related to: HTN  An unsuccessful telephone outreach was attempted today to contact the patient who was referred to the pharmacy team for assistance with medication assistance. Additional attempts will be made to contact the patient.  Burman Nieves, CMA, Care Guide [facility]  [facility], [facility] Guide Direct Dial: [phone]  Fax: [phone] Website: [email or web address]

## 2024-01-06 NOTE — Progress Notes (Unsigned)
 Care Guide Pharmacy Note  01/06/2024 Name: Sarah Watts MRN: [id number] DOB: 1966-04-16  Referred By: Etta Grandchild, MD Reason for referral: Care Coordination (Outreach to schedule referral with pharmacist )   Sarah Watts is a 58 y.o. year old female who is a primary care patient of Etta Grandchild, MD.  Sarah Watts was referred to the pharmacist for assistance related to: HTN  A second unsuccessful telephone outreach was attempted today to contact the patient who was referred to the pharmacy team for assistance with medication assistance. Additional attempts will be made to contact the patient.  Burman Nieves, CMA, Care Guide [facility]  [facility], [facility] Guide Direct Dial: [phone]  Fax: [phone] Website: [email or web address]

## 2024-01-07 ENCOUNTER — Other Ambulatory Visit: Payer: Self-pay | Admitting: Internal Medicine

## 2024-01-07 DIAGNOSIS — R339 Retention of urine, unspecified: Secondary | ICD-10-CM | POA: Insufficient documentation

## 2024-01-07 NOTE — Progress Notes (Signed)
 Care Guide Pharmacy Note  01/07/2024 Name: JADINE BRUMLEY MRN: [id number] DOB: 02-15-1966  Referred By: Etta Grandchild, MD Reason for referral: Care Coordination (Outreach to schedule referral with pharmacist )   LORENZA SHAKIR is a 58 y.o. year old female who is a primary care patient of Etta Grandchild, MD.  Welton Flakes was referred to the pharmacist for assistance related to: HTN  A third unsuccessful telephone outreach was attempted today to contact the patient who was referred to the pharmacy team for assistance with medication assistance. The Population Health team is pleased to engage with this patient at any time in the future upon receipt of referral and should he/she be interested in assistance from the [facility] team.  Burman Nieves, CMA, Care Guide [facility]  [facility], [facility] Guide Direct Dial: [phone]  Fax: [phone] Website: [email or web address]

## 2024-02-04 DIAGNOSIS — B3731 Acute candidiasis of vulva and vagina: Secondary | ICD-10-CM | POA: Diagnosis not present

## 2024-02-04 DIAGNOSIS — R3914 Feeling of incomplete bladder emptying: Secondary | ICD-10-CM | POA: Diagnosis not present

## 2024-02-19 DIAGNOSIS — H40003 Preglaucoma, unspecified, bilateral: Secondary | ICD-10-CM | POA: Diagnosis not present

## 2024-02-19 DIAGNOSIS — H40053 Ocular hypertension, bilateral: Secondary | ICD-10-CM | POA: Diagnosis not present

## 2024-02-19 DIAGNOSIS — H2513 Age-related nuclear cataract, bilateral: Secondary | ICD-10-CM | POA: Diagnosis not present

## 2024-02-19 DIAGNOSIS — E119 Type 2 diabetes mellitus without complications: Secondary | ICD-10-CM | POA: Diagnosis not present

## 2024-03-10 DIAGNOSIS — N189 Chronic kidney disease, unspecified: Secondary | ICD-10-CM | POA: Diagnosis not present

## 2024-03-10 DIAGNOSIS — I700 Atherosclerosis of aorta: Secondary | ICD-10-CM | POA: Diagnosis not present

## 2024-03-10 DIAGNOSIS — M79602 Pain in left arm: Secondary | ICD-10-CM | POA: Diagnosis not present

## 2024-03-10 DIAGNOSIS — I498 Other specified cardiac arrhythmias: Secondary | ICD-10-CM | POA: Diagnosis not present

## 2024-03-10 DIAGNOSIS — E669 Obesity, unspecified: Secondary | ICD-10-CM | POA: Diagnosis not present

## 2024-03-10 DIAGNOSIS — E1122 Type 2 diabetes mellitus with diabetic chronic kidney disease: Secondary | ICD-10-CM | POA: Diagnosis not present

## 2024-03-10 DIAGNOSIS — R202 Paresthesia of skin: Secondary | ICD-10-CM | POA: Diagnosis not present

## 2024-03-10 DIAGNOSIS — M7989 Other specified soft tissue disorders: Secondary | ICD-10-CM | POA: Diagnosis not present

## 2024-03-10 DIAGNOSIS — R079 Chest pain, unspecified: Secondary | ICD-10-CM | POA: Diagnosis not present

## 2024-03-10 DIAGNOSIS — R200 Anesthesia of skin: Secondary | ICD-10-CM | POA: Diagnosis not present

## 2024-03-10 DIAGNOSIS — R072 Precordial pain: Secondary | ICD-10-CM | POA: Diagnosis not present

## 2024-03-10 DIAGNOSIS — I82612 Acute embolism and thrombosis of superficial veins of left upper extremity: Secondary | ICD-10-CM | POA: Diagnosis not present

## 2024-03-10 DIAGNOSIS — E785 Hyperlipidemia, unspecified: Secondary | ICD-10-CM | POA: Diagnosis not present

## 2024-03-10 DIAGNOSIS — R9431 Abnormal electrocardiogram [ECG] [EKG]: Secondary | ICD-10-CM | POA: Diagnosis not present

## 2024-03-10 DIAGNOSIS — M549 Dorsalgia, unspecified: Secondary | ICD-10-CM | POA: Diagnosis not present

## 2024-03-10 DIAGNOSIS — E039 Hypothyroidism, unspecified: Secondary | ICD-10-CM | POA: Diagnosis not present

## 2024-03-10 DIAGNOSIS — I129 Hypertensive chronic kidney disease with stage 1 through stage 4 chronic kidney disease, or unspecified chronic kidney disease: Secondary | ICD-10-CM | POA: Diagnosis not present

## 2024-03-10 DIAGNOSIS — I4510 Unspecified right bundle-branch block: Secondary | ICD-10-CM | POA: Diagnosis not present

## 2024-03-30 ENCOUNTER — Encounter: Payer: Self-pay | Admitting: Internal Medicine

## 2024-03-30 ENCOUNTER — Ambulatory Visit (INDEPENDENT_AMBULATORY_CARE_PROVIDER_SITE_OTHER): Admitting: Internal Medicine

## 2024-03-30 VITALS — BP 156/72 | HR 66 | Temp 97.7°F | Ht 60.0 in | Wt 188.6 lb

## 2024-03-30 DIAGNOSIS — E785 Hyperlipidemia, unspecified: Secondary | ICD-10-CM | POA: Diagnosis not present

## 2024-03-30 DIAGNOSIS — E118 Type 2 diabetes mellitus with unspecified complications: Secondary | ICD-10-CM | POA: Diagnosis not present

## 2024-03-30 DIAGNOSIS — I83812 Varicose veins of left lower extremities with pain: Secondary | ICD-10-CM | POA: Insufficient documentation

## 2024-03-30 DIAGNOSIS — I10 Essential (primary) hypertension: Secondary | ICD-10-CM

## 2024-03-30 DIAGNOSIS — E039 Hypothyroidism, unspecified: Secondary | ICD-10-CM

## 2024-03-30 DIAGNOSIS — I82409 Acute embolism and thrombosis of unspecified deep veins of unspecified lower extremity: Secondary | ICD-10-CM | POA: Insufficient documentation

## 2024-03-30 DIAGNOSIS — I82622 Acute embolism and thrombosis of deep veins of left upper extremity: Secondary | ICD-10-CM

## 2024-03-30 DIAGNOSIS — Z0001 Encounter for general adult medical examination with abnormal findings: Secondary | ICD-10-CM

## 2024-03-30 DIAGNOSIS — E538 Deficiency of other specified B group vitamins: Secondary | ICD-10-CM | POA: Diagnosis not present

## 2024-03-30 DIAGNOSIS — Z1211 Encounter for screening for malignant neoplasm of colon: Secondary | ICD-10-CM | POA: Insufficient documentation

## 2024-03-30 DIAGNOSIS — Z1231 Encounter for screening mammogram for malignant neoplasm of breast: Secondary | ICD-10-CM | POA: Insufficient documentation

## 2024-03-30 DIAGNOSIS — Z0000 Encounter for general adult medical examination without abnormal findings: Secondary | ICD-10-CM | POA: Diagnosis not present

## 2024-03-30 DIAGNOSIS — K625 Hemorrhage of anus and rectum: Secondary | ICD-10-CM

## 2024-03-30 LAB — LIPID PANEL
Cholesterol: 99 mg/dL (ref 0–200)
HDL: 37.7 mg/dL — ABNORMAL LOW (ref >39.00)
LDL Cholesterol: 32 mg/dL (ref 0–99)
NonHDL: 61.75
Total CHOL/HDL Ratio: 3
Triglycerides: 148 mg/dL (ref 0.0–149.0)
VLDL: 29.6 mg/dL (ref 0.0–40.0)

## 2024-03-30 LAB — HEPATIC FUNCTION PANEL
ALT: 17 U/L (ref 0–35)
AST: 16 U/L (ref 0–37)
Albumin: 4.6 g/dL (ref 3.5–5.2)
Alkaline Phosphatase: 86 U/L (ref 39–117)
Bilirubin, Direct: 0.2 mg/dL (ref 0.0–0.3)
Total Bilirubin: 0.7 mg/dL (ref 0.2–1.2)
Total Protein: 7.3 g/dL (ref 6.0–8.3)

## 2024-03-30 LAB — D-DIMER, QUANTITATIVE: D-Dimer, Quant: <0.19 ug{FEU}/mL (ref <0.50)

## 2024-03-30 MED ORDER — CYANOCOBALAMIN 1000 MCG/ML IJ SOLN
1000.0000 ug | Freq: Once | INTRAMUSCULAR | Status: AC
  Administered 2024-03-30: 1000 ug via INTRAMUSCULAR

## 2024-03-30 NOTE — Patient Instructions (Signed)

## 2024-03-30 NOTE — Progress Notes (Unsigned)
 Subjective:  Patient ID: Sarah Watts, female    DOB: [birth year]  Age: 58 y.o. MRN: [id number]  CC: Medical Management of Chronic Issues (BP follow up /), ED Follow Up  (Blood clot in her left arm. ), Leg Pain (Left leg swollen and painful. Discoloration for about a couple month and it started getting worst within these last two weeks. ), and Annual Exam   HPI Sarah Watts presents for a CPX and f/up -----  Discussed the use of AI scribe software for clinical note transcription with the patient, who gave verbal consent to proceed.  History of Present Illness   Sarah Watts is a 58 year old female with a history of COPD who presents with left arm swelling and pain due to a blood clot.  About a week ago, she visited the emergency room at [facility] due to swelling and redness in her left arm. An ultrasound was performed, revealing a blood clot, possibly in the cephalic vein. No recent injury to the arm. She was prescribed prescription-strength aspirin and is not on any blood thinners.  She experiences persistent pain and swelling in her left leg, which has worsened over time. The pain is present throughout the leg, particularly in the ankle, and is exacerbated by walking, improving with rest. She notes spider veins, similar to those her mother had, and wonders if they contribute to her pain. Pain in the knee and ankle intensifies when walking, making it difficult to navigate stairs after work.  She has a history of COPD and has quit smoking since the diagnosis. Her breathing is currently stable. She has been making lifestyle changes, including increased physical activity and weight loss, despite the pain in her leg.  No cramping in the leg when walking but reports pain in the leg and ankle that worsens with activity and improves with rest.       Outpatient Medications Prior to Visit  Medication Sig Dispense Refill   albuterol  (VENTOLIN  HFA) 108 (90 Base) MCG/ACT inhaler Inhale 2  puffs into the lungs every 6 (six) hours as needed for wheezing or shortness of breath. 18 g 5   baclofen  (LIORESAL ) 10 MG tablet Take 1 tablet (10 mg total) by mouth 3 (three) times daily. 30 each 0   Budeson-Glycopyrrol-Formoterol (BREZTRI  AEROSPHERE) 160-9-4.8 MCG/ACT AERO Inhale 2 puffs into the lungs 2 (two) times daily. 32.1 g 1   Esomeprazole  Magnesium (NEXIUM  PO) Take 1 tablet by mouth daily.     famotidine (PEPCID) 10 MG tablet Take 10 mg by mouth.     fluocinonide -emollient (LIDEX -E) 0.05 % cream Apply 1 Application topically 2 (two) times daily. 60 g 2   fluticasone  (FLONASE ) 50 MCG/ACT nasal spray Use 2 spray(s) in each nostril once daily 48 g 1   levocetirizine (XYZAL ) 5 MG tablet Take 1 tablet (5 mg total) by mouth every evening. 90 tablet 0   spironolactone  (ALDACTONE ) 50 MG tablet Take 1 tablet (50 mg total) by mouth daily. 90 tablet 0   irbesartan  (AVAPRO ) 300 MG tablet Take 1 tablet (300 mg total) by mouth daily. 90 tablet 0   levothyroxine  (SYNTHROID ) 75 MCG tablet Take 1 tablet (75 mcg total) by mouth daily. 90 tablet 0   rosuvastatin  (CRESTOR ) 10 MG tablet Take 1 tablet (10 mg total) by mouth daily. 90 tablet 0   irbesartan  (AVAPRO ) 300 MG tablet Take 1 tablet (300 mg total) by mouth daily. 90 tablet 0   levothyroxine  (SYNTHROID ) 75 MCG tablet Take  1 tablet (75 mcg total) by mouth daily. 90 tablet 0   No facility-administered medications prior to visit.    ROS Review of Systems  Constitutional:  Negative for appetite change, chills, diaphoresis, fatigue and fever.  HENT: Negative.    Eyes:  Negative for visual disturbance.  Respiratory: Negative.  Negative for cough, chest tightness and shortness of breath.   Cardiovascular:  Negative for chest pain, palpitations and leg swelling.  Gastrointestinal:  Positive for anal bleeding and blood in stool. Negative for abdominal pain, constipation, diarrhea, nausea and vomiting.  Endocrine: Negative for polydipsia, polyphagia and  polyuria.  Genitourinary: Negative.  Negative for difficulty urinating.  Musculoskeletal:  Positive for arthralgias. Negative for back pain and myalgias.  Skin: Negative.   Neurological: Negative.  Negative for dizziness.  Hematological:  Negative for adenopathy. Does not bruise/bleed easily.  Psychiatric/Behavioral: Negative.      Objective:  BP (!) 156/72 (BP Location: Left Arm, Patient Position: Sitting, Cuff Size: Normal)   Pulse 66   Temp 97.7 F (36.5 C) (Oral)   Ht 5' (1.524 m)   Wt 188 lb 9.6 oz (85.5 kg)   SpO2 96%   BMI 36.83 kg/m   BP Readings from Last 3 Encounters:  03/30/24 (!) 156/72  12/31/23 (!) 176/72  08/28/23 132/76    Wt Readings from Last 3 Encounters:  03/30/24 188 lb 9.6 oz (85.5 kg)  12/31/23 188 lb 12.8 oz (85.6 kg)  08/28/23 188 lb (85.3 kg)    Physical Exam Vitals reviewed.  Constitutional:      Appearance: Normal appearance.  HENT:     Mouth/Throat:     Mouth: Mucous membranes are moist.  Eyes:     General: No scleral icterus.    Conjunctiva/sclera: Conjunctivae normal.  Cardiovascular:     Rate and Rhythm: Normal rate and regular rhythm.     Heart sounds: No murmur heard.    No friction rub. No gallop.  Pulmonary:     Effort: Pulmonary effort is normal.     Breath sounds: No stridor. No wheezing, rhonchi or rales.  Abdominal:     General: Abdomen is flat.     Palpations: There is no mass.     Tenderness: There is no abdominal tenderness. There is no guarding.     Hernia: No hernia is present.  Musculoskeletal:        General: Normal range of motion.     Cervical back: Neck supple.     Right lower leg: No edema.     Left lower leg: No edema.     Comments: Mild swelling LUE  Lymphadenopathy:     Cervical: No cervical adenopathy.  Skin:    General: Skin is warm and dry.     Findings: No erythema or rash.  Neurological:     General: No focal deficit present.     Mental Status: She is alert. Mental status is at baseline.   Psychiatric:        Mood and Affect: Mood normal.        Behavior: Behavior normal.     Lab Results  Component Value Date   WBC 7.6 12/31/2023   HGB 14.1 12/31/2023   HCT 42.7 12/31/2023   PLT 349.0 12/31/2023   GLUCOSE 105 (H) 12/31/2023   CHOL 99 03/30/2024   TRIG 148.0 03/30/2024   HDL 37.70 (L) 03/30/2024   LDLDIRECT 123.0 04/02/2023   LDLCALC 32 03/30/2024   ALT 17 03/30/2024   AST 16 03/30/2024  NA 140 12/31/2023   K 4.3 12/31/2023   CL 105 12/31/2023   CREATININE 0.93 12/31/2023   BUN 10 12/31/2023   CO2 28 12/31/2023   TSH 1.17 12/31/2023   HGBA1C 6.4 12/31/2023   MICROALBUR 0.8 12/31/2023   Procedure Note  Avutu, Hima Bindu R, MD - 03/10/2024 Formatting of this note might be different from the original. EXAMINATION: CTA CHEST, ABDOMEN, AND PELVIS  CLINICAL HISTORY: Chest pain  DATE: 03/10/2024 10:30 AM  COMPARISON: None  TECHNIQUE: Computed tomography angiography was performed of the chest, abdomen, and pelvis aorta during uneventful intravenous contrast administration of 90 mL mL Isovue-370 according to CTA protocol additional 3-D volume rendered and MIP reconstructions images obtained.  A dose lowering technique was used for this procedure, which may include, but is not limited to, dose reduction techniques, automated exposure control, the use of a iterative reconstruction, and ALARA (as low as reasonably achievable)/image gently techniques.   FINDINGS: CHEST:  CARDIOVASCULAR: Heart:The heart is normal in size.  No pericardial effusion/thickening. Aorta:The course and caliber of the thoracic aorta is normal.   There is []  aortic atherosclerotic calcification.  Precontrast images show no aortic intramural hematoma.  There is no blood flow, dissection, or penetrating ulcer demonstrated on arterial phase postcontrast images.  There is a conventional three-vessel aortic arch branching pattern.  The proximal arch vessels are widely patent. Pulmonary  arteries:Contrast timing is not optimized for preferential opacification of the aorta.  Within this limitation, normal central pulmonary arteries.  MEDIASTINUM/NODES: No mediastinal, hilar, or axillary adenopathy.  The visualized thyroid  and thoracic esophageal clips are unremarkable.  LUNGS/PLEURA: No pulmonary nodules or masses are seen.  No pleural effusion or pneumothorax.  No focal airspace consolidation.    MUSCULOSKELETAL: No chest wall abnormality.  No acute osseous findings are seen.  Review of the MIP images confirm the above findings.  ABDOMEN/PELVIS:  VASCULAR: Aorta: Normal in caliber without aneurysm, dissection, vasculitis, or hemodynamically significant stenosis. There is moderate aortic atherosclerotic calcification.  Celiac: No aneurysm, dissection, or hemodynamically significant stenosis.  Normal branching pattern.  JWJ:XBJYNW patent without dissection or stenosis.  Renals: There are 2 right-sided renal arteries are present. There is one single left-sided renal artery with mild calcification at the origin. No aneurysm, dissection, stenosis, or evidence of fibromuscular dysplasia.  IMA: Patent without abnormality.  Inflow:No aneurysm, stenosis, or dissection.  Veins: Normal in course and caliber image of these.  Assessment is otherwise limited by the arterial dominant contrast.  Review of the MIP images confirm the above findings.  NON-VASCULAR:  Hepatobiliary: The liver is normal in density without focal abnormality. The main portal vein is patent.   The gallbladder is nonvisualized.  Pancreas: No pancreatic ductal dilation or surrounding inflammatory changes.  Spleen: Normal in size without focal abnormality.  Adrenals/Urinary Tract: Both adrenal glands appear normal.  The kidneys and collecting system appear normal without evidence of urinary tract calculus or hydronephrosis. The bladder is unremarkable.  Stomach/Bowel: The stomach, small bowel, and  colon are normal in appearance. No inflammatory changes, wall thickening, or obstructive findings.  The appendix is normal.  Lymph Nodes:No retroperitoneal, mesenteric, or inguinal lymphadenopathy.  Pelvis: The uterus and adnexa are unremarkable.  Other: No abdominal wall mass or hernia. No free fluid.  Musculoskeletal: No acute or significant osseous abnormality.   Impression: No acute aortic abnormality Moderate scattered aortic atherosclerosis. No acute intrathoracic, abdominal, or pelvic pathology to explain patient's symptoms.   IMPRESSION:  Left cephalic vein thrombosis.    ###  CODE SIGNIFICANT REPORT###  Automated notification pathway concerning the above report was activated at the time below.   ORDERING PROVIDER (Unless stated above may not be the provider contacted): Adonica Hoose TIME: 03/10/2024 1:49 PM  Electronically Signed by: Tobin Forts, MD on 03/10/2024 1:49 PM Resulting Agency Comment   Assessment & Plan:  Essential hypertension- Will try to achieve better BP control. -     Hepatic function panel; Future -     Irbesartan ; Take 1 tablet (300 mg total) by mouth daily.  Dispense: 90 tablet; Refill: 0  Encounter for general adult medical examination with abnormal findings- Exam completed, labs reviewed, vaccines reviewed, cancer screenings addressed, pt ed material was given.   Type II diabetes mellitus with manifestations (HCC) -     HM Diabetes Foot Exam -     Irbesartan ; Take 1 tablet (300 mg total) by mouth daily.  Dispense: 90 tablet; Refill: 0  Varicose veins of left lower extremity with pain -     Ambulatory referral to Vascular Surgery  Dyslipidemia, goal LDL below 100- LDL goal achieved. Doing well on the statin  -     Lipid panel; Future -     Rosuvastatin  Calcium ; Take 1 tablet (10 mg total) by mouth daily.  Dispense: 90 tablet; Refill: 0  Acute deep vein thrombosis (DVT) of other vein of left upper extremity (HCC) -     D-dimer, quantitative;  Future -     Ambulatory referral to Vascular Surgery  B12 deficiency -     Cyanocobalamin   BRBPR (bright red blood per rectum) -     Ambulatory referral to Gastroenterology  Acquired hypothyroidism -     Levothyroxine  Sodium; Take 1 tablet (75 mcg total) by mouth daily.  Dispense: 90 tablet; Refill: 0     Follow-up: Return in about 4 months (around 07/30/2024).  Sandra Crouch, MD

## 2024-04-02 ENCOUNTER — Other Ambulatory Visit: Payer: Self-pay | Admitting: Internal Medicine

## 2024-04-02 DIAGNOSIS — E785 Hyperlipidemia, unspecified: Secondary | ICD-10-CM

## 2024-04-02 MED ORDER — ROSUVASTATIN CALCIUM 10 MG PO TABS
10.0000 mg | ORAL_TABLET | Freq: Every day | ORAL | 0 refills | Status: DC
Start: 2024-04-02 — End: 2024-06-30

## 2024-04-02 MED ORDER — LEVOTHYROXINE SODIUM 75 MCG PO TABS: 75.0000 ug | ORAL_TABLET | Freq: Every day | ORAL | 0 refills | Status: DC

## 2024-04-02 MED ORDER — IRBESARTAN 300 MG PO TABS
300.0000 mg | ORAL_TABLET | Freq: Every day | ORAL | 0 refills | Status: DC
Start: 2024-04-02 — End: 2024-08-05

## 2024-04-03 ENCOUNTER — Other Ambulatory Visit: Payer: Self-pay | Admitting: Internal Medicine

## 2024-04-03 DIAGNOSIS — E269 Hyperaldosteronism, unspecified: Secondary | ICD-10-CM

## 2024-04-03 DIAGNOSIS — J301 Allergic rhinitis due to pollen: Secondary | ICD-10-CM

## 2024-04-12 ENCOUNTER — Ambulatory Visit: Payer: Self-pay | Admitting: Internal Medicine

## 2024-04-14 DIAGNOSIS — M79661 Pain in right lower leg: Secondary | ICD-10-CM | POA: Diagnosis not present

## 2024-04-14 DIAGNOSIS — I87393 Chronic venous hypertension (idiopathic) with other complications of bilateral lower extremity: Secondary | ICD-10-CM | POA: Diagnosis not present

## 2024-04-14 DIAGNOSIS — M79662 Pain in left lower leg: Secondary | ICD-10-CM | POA: Diagnosis not present

## 2024-04-14 DIAGNOSIS — I872 Venous insufficiency (chronic) (peripheral): Secondary | ICD-10-CM | POA: Diagnosis not present

## 2024-05-07 ENCOUNTER — Encounter: Payer: Self-pay | Admitting: Physician Assistant

## 2024-05-22 ENCOUNTER — Telehealth: Admitting: Physician Assistant

## 2024-05-22 DIAGNOSIS — J069 Acute upper respiratory infection, unspecified: Secondary | ICD-10-CM

## 2024-05-22 MED ORDER — FLUTICASONE PROPIONATE 50 MCG/ACT NA SUSP: 2.0000 | Freq: Every day | NASAL | 0 refills | Status: AC

## 2024-05-22 MED ORDER — PROMETHAZINE-DM 6.25-15 MG/5ML PO SYRP: 5.0000 mL | ORAL_SOLUTION | Freq: Four times a day (QID) | ORAL | 0 refills | Status: DC | PRN

## 2024-05-22 NOTE — Progress Notes (Signed)
 E-Visit for Upper Respiratory Infection   We are sorry you are not feeling well.  Here is how we plan to help!  Based on what you have shared with me, it looks like you may have a viral upper respiratory infection.  Upper respiratory infections are caused by a large number of viruses; however, rhinovirus is the most common cause.   Symptoms vary from person to person, with common symptoms including sore throat, cough, fatigue or lack of energy and feeling of general discomfort.  A low-grade fever of up to 100.4 may present, but is often uncommon.  Symptoms vary however, and are closely related to a person's age or underlying illnesses.  The most common symptoms associated with an upper respiratory infection are nasal discharge or congestion, cough, sneezing, headache and pressure in the ears and face.  These symptoms usually persist for about 3 to 10 days, but can last up to 2 weeks.  It is important to know that upper respiratory infections do not cause serious illness or complications in most cases.    Upper respiratory infections can be transmitted from person to person, with the most common method of transmission being a person's hands.  The virus is able to live on the skin and can infect other persons for up to 2 hours after direct contact.  Also, these can be transmitted when someone coughs or sneezes; thus, it is important to cover the mouth to reduce this risk.  To keep the spread of the illness at bay, good hand hygiene is very important.  This is an infection that is most likely caused by a virus. There are no specific treatments other than to help you with the symptoms until the infection runs its course.  We are sorry you are not feeling well.  Here is how we plan to help!   For nasal congestion, you may use an oral decongestants such as Mucinex D or if you have glaucoma or high blood pressure use plain Mucinex.  Saline nasal spray or nasal drops can help and can safely be used as often as  needed for congestion.  For your congestion, I have prescribed Fluticasone  nasal spray one spray in each nostril twice a day  If you do not have a history of heart disease, hypertension, diabetes or thyroid  disease, prostate/bladder issues or glaucoma, you may also use Sudafed to treat nasal congestion.  It is highly recommended that you consult with a pharmacist or your primary care physician to ensure this medication is safe for you to take.     If you have a cough, you may use cough suppressants such as Delsym and Robitussin.  If you have glaucoma or high blood pressure, you can also use Coricidin HBP.   For cough I have prescribed for you Promethazine  DM cough syrup Take 5mL every 6 hours as needed for cough.   If you have a sore or scratchy throat, use a saltwater gargle-  to  teaspoon of salt dissolved in a 4-ounce to 8-ounce glass of warm water.  Gargle the solution for approximately 15-30 seconds and then spit.  It is important not to swallow the solution.  You can also use throat lozenges/cough drops and Chloraseptic spray to help with throat pain or discomfort.  Warm or cold liquids can also be helpful in relieving throat pain.  For headache, pain or general discomfort, you can use Ibuprofen or Tylenol as directed.   Some authorities believe that zinc sprays or the use of  Echinacea may shorten the course of your symptoms.  I do recommend to take an at home Covid +Flu test since we have been seeing higher numbers of Covid again. You would be a candidate for antiviral therapy for Covid if you test positive.  HOME CARE Only take medications as instructed by your medical team. Be sure to drink plenty of fluids. Water is fine as well as fruit juices, sodas and electrolyte beverages. You may want to stay away from caffeine or alcohol. If you are nauseated, try taking small sips of liquids. How do you know if you are getting enough fluid? Your urine should be a pale yellow or almost  colorless. Get rest. Taking a steamy shower or using a humidifier may help nasal congestion and ease sore throat pain. You can place a towel over your head and breathe in the steam from hot water coming from a faucet. Using a saline nasal spray works much the same way. Cough drops, hard candies and sore throat lozenges may ease your cough. Avoid close contacts especially the very young and the elderly Cover your mouth if you cough or sneeze Always remember to wash your hands.   GET HELP RIGHT AWAY IF: You develop worsening fever. If your symptoms do not improve within 10 days You develop yellow or green discharge from your nose over 3 days. You have coughing fits You develop a severe head ache or visual changes. You develop shortness of breath, difficulty breathing or start having chest pain Your symptoms persist after you have completed your treatment plan  MAKE SURE YOU  Understand these instructions. Will watch your condition. Will get help right away if you are not doing well or get worse.  Thank you for choosing an e-visit.  Your e-visit answers were reviewed by a board certified advanced clinical practitioner to complete your personal care plan. Depending upon the condition, your plan could have included both over the counter or prescription medications.  Please review your pharmacy choice. Make sure the pharmacy is open so you can pick up prescription now. If there is a problem, you may contact your provider through [employer] and have the prescription routed to another pharmacy.  Your safety is important to us . If you have drug allergies check your prescription carefully.   For the next 24 hours you can use MyChart to ask questions about today's visit, request a non-urgent call back, or ask for a work or school excuse. You will get an email in the next two days asking about your experience. I hope that your e-visit has been valuable and will speed your recovery.    I  have spent 5 minutes in review of e-visit questionnaire, review and updating patient chart, medical decision making and response to patient.   Delon CHRISTELLA Dickinson, PA-C

## 2024-06-02 ENCOUNTER — Other Ambulatory Visit: Payer: Self-pay | Admitting: Internal Medicine

## 2024-06-02 DIAGNOSIS — Z1231 Encounter for screening mammogram for malignant neoplasm of breast: Secondary | ICD-10-CM

## 2024-06-16 ENCOUNTER — Ambulatory Visit
Admission: RE | Admit: 2024-06-16 | Discharge: 2024-06-16 | Disposition: A | Discharge status: Home / Self Care | Source: Ambulatory Visit | Attending: Internal Medicine | Admitting: Internal Medicine

## 2024-06-16 DIAGNOSIS — Z1231 Encounter for screening mammogram for malignant neoplasm of breast: Secondary | ICD-10-CM | POA: Diagnosis not present

## 2024-06-18 DIAGNOSIS — R0602 Shortness of breath: Secondary | ICD-10-CM | POA: Diagnosis not present

## 2024-06-18 DIAGNOSIS — Z86718 Personal history of other venous thrombosis and embolism: Secondary | ICD-10-CM | POA: Diagnosis not present

## 2024-06-18 DIAGNOSIS — J441 Chronic obstructive pulmonary disease with (acute) exacerbation: Secondary | ICD-10-CM | POA: Diagnosis not present

## 2024-06-18 DIAGNOSIS — Z87891 Personal history of nicotine dependence: Secondary | ICD-10-CM | POA: Diagnosis not present

## 2024-06-18 DIAGNOSIS — R059 Cough, unspecified: Secondary | ICD-10-CM | POA: Diagnosis not present

## 2024-06-18 DIAGNOSIS — R9431 Abnormal electrocardiogram [ECG] [EKG]: Secondary | ICD-10-CM | POA: Diagnosis not present

## 2024-06-18 DIAGNOSIS — E1122 Type 2 diabetes mellitus with diabetic chronic kidney disease: Secondary | ICD-10-CM | POA: Diagnosis not present

## 2024-06-18 DIAGNOSIS — Z8709 Personal history of other diseases of the respiratory system: Secondary | ICD-10-CM | POA: Diagnosis not present

## 2024-06-18 DIAGNOSIS — E785 Hyperlipidemia, unspecified: Secondary | ICD-10-CM | POA: Diagnosis not present

## 2024-06-18 DIAGNOSIS — R519 Headache, unspecified: Secondary | ICD-10-CM | POA: Diagnosis not present

## 2024-06-18 DIAGNOSIS — I129 Hypertensive chronic kidney disease with stage 1 through stage 4 chronic kidney disease, or unspecified chronic kidney disease: Secondary | ICD-10-CM | POA: Diagnosis not present

## 2024-06-18 DIAGNOSIS — K760 Fatty (change of) liver, not elsewhere classified: Secondary | ICD-10-CM | POA: Diagnosis not present

## 2024-06-18 DIAGNOSIS — Z9049 Acquired absence of other specified parts of digestive tract: Secondary | ICD-10-CM | POA: Diagnosis not present

## 2024-06-18 DIAGNOSIS — Z885 Allergy status to narcotic agent status: Secondary | ICD-10-CM | POA: Diagnosis not present

## 2024-06-18 DIAGNOSIS — N189 Chronic kidney disease, unspecified: Secondary | ICD-10-CM | POA: Diagnosis not present

## 2024-06-18 DIAGNOSIS — I4510 Unspecified right bundle-branch block: Secondary | ICD-10-CM | POA: Diagnosis not present

## 2024-06-18 DIAGNOSIS — Z9089 Acquired absence of other organs: Secondary | ICD-10-CM | POA: Diagnosis not present

## 2024-06-25 ENCOUNTER — Other Ambulatory Visit: Payer: Self-pay | Admitting: Internal Medicine

## 2024-06-25 DIAGNOSIS — E785 Hyperlipidemia, unspecified: Secondary | ICD-10-CM

## 2024-06-30 NOTE — Progress Notes (Unsigned)
 Ellouise Console, PA-C [address] [place], [place]  [address] Phone: [phone]   Gastroenterology Consultation  Referring Provider:     Joshua Debby CROME, MD Primary Care Physician:  Joshua Debby CROME, MD Primary Gastroenterologist:  Ellouise Console, PA-C / Dr. Gordy Starch  Reason for Consultation:     Rectal bleeding        HPI:   Sarah Watts is a 58 y.o. y/o Watts referred for consultation & management  by Joshua Debby CROME, MD. She is here today with her wife.  New patient.  Here to evaluate rectal bleeding for 5 months.  She sees bright red blood on the tissue and in her underwear after bowel movements.  Occasionally streaks of blood in the stool.  She denies rectal pain.  Has occasional rectal burning after she has diarrhea.  Labs 3 weeks ago showed normal hemoglobin 13 g, no anemia.  She has never had a colonoscopy.  No family history of colon cancer.  No previous GI evaluation.  She has irregular bowel habits with diarrhea alternating with constipation.  She has intermittent left-sided abdominal cramping which comes and goes for over a year.  Denies weight loss.  She reports having 2 negative Cologuard test in the past through her PCP.  Last Cologuard was about 2 years ago.  She has never seen a GI specialist.  06/18/2024 labs: Normal hemoglobin 13.7, WBC 9.4.  Normal CBC.  No anemia.  Mildly elevated calcium  10.6.  Otherwise normal CMP.  Normal LFTs.  COVID-negative.  Flu and RSV negative.  PMH: Hx DVT, HTN, COPD, emphysema, sleep apnea, chronic bronchitis, hyperparathyroidism, hypothyroidism, type 2 diabetes, CKD 3, dyslipidemia, tobacco abuse, GERD, hepatic steatosis.  Takes 81 mg aspirin daily.  No other blood thinners.  COPD is stable on inhalers.  10/2022 abdominal MRI with and without contrast: (To evaluate left side abdominal pain) 1. Normal appearance of the bilateral adrenal glands without focal nodularity/mass 2. Diffuse hepatic steatosis with a geographic area of  more prominent fatty infiltration involving segment VIII/V which can be a cause of abdominal pain in the setting of steatohepatitis. 3. Prominent right upper quadrant lymph nodes, likely reactive.  Past Medical History:  Diagnosis Date   Allergic rhinitis    Heart murmur    HTN (hypertension)    Irregular heartbeat     Past Surgical History:  Procedure Laterality Date   APPENDECTOMY     CESAREAN SECTION     CHOLECYSTECTOMY     FACIAL RECONSTRUCTION SURGERY     Left, due to birth defect   TONSILLECTOMY      Prior to Admission medications   Medication Sig Start Date End Date Taking? Authorizing Provider  albuterol  (VENTOLIN  HFA) 108 (90 Base) MCG/ACT inhaler Inhale 2 puffs into the lungs every 6 (six) hours as needed for wheezing or shortness of breath. 07/06/22   Joshua Debby CROME, MD  baclofen  (LIORESAL ) 10 MG tablet Take 1 tablet (10 mg total) by mouth 3 (three) times daily. 01/01/23   Ward, Jessica Z, PA-C  Budeson-Glycopyrrol-Formoterol (BREZTRI  AEROSPHERE) 160-9-4.8 MCG/ACT AERO Inhale 2 puffs into the lungs 2 (two) times daily. 12/31/23   Joshua Debby CROME, MD  Esomeprazole  Magnesium (NEXIUM  PO) Take 1 tablet by mouth daily.    [provider]  famotidine (PEPCID) 10 MG tablet Take 10 mg by mouth. 08/11/18   [provider]  fluocinonide -emollient (LIDEX -E) 0.05 % cream Apply 1 Application topically 2 (two) times daily. 08/07/23  Joshua Debby CROME, MD  fluticasone  (FLONASE ) 50 MCG/ACT nasal spray Place 2 sprays into both nostrils daily. 05/22/24   Vivienne Delon HERO, PA-C  irbesartan  (AVAPRO ) 300 MG tablet Take 1 tablet (300 mg total) by mouth daily. 04/02/24   Joshua Debby CROME, MD  levocetirizine (XYZAL ) 5 MG tablet TAKE 1 TABLET BY MOUTH ONCE DAILY IN THE EVENING 04/06/24   Joshua Debby CROME, MD  levothyroxine  (SYNTHROID ) 75 MCG tablet Take 1 tablet (75 mcg total) by mouth daily. 04/02/24   Joshua Debby CROME, MD  promethazine -dextromethorphan (PROMETHAZINE -DM) 6.25-15 MG/5ML syrup  Take 5 mLs by mouth 4 (four) times daily as needed. 05/22/24   Vivienne Delon HERO, PA-C  rosuvastatin  (CRESTOR ) 10 MG tablet Take 1 tablet by mouth once daily 06/30/24   Joshua Debby CROME, MD  spironolactone  (ALDACTONE ) 50 MG tablet Take 1 tablet by mouth once daily 04/06/24   Joshua Debby CROME, MD    Family History  Problem Relation Age of Onset   Hypertension Mother    Allergies Mother    Breast cancer Mother        51s   Heart attack Mother    Stroke Mother    Prostate cancer Father        x2   Cancer Father    Stroke Father    Heart attack Father    Hypertension Father    Diabetes Father    Allergies Father    Heart disease Father    Allergies Sister    Non-Hodgkin's lymphoma Sister        x2   Asthma Sister    Lung cancer Maternal Grandmother    Ovarian cancer Paternal Grandmother    Diabetes Other        1st degree relative   Hypertension Other    Prostate cancer Other        1st degree relative <50   Colon cancer Neg Hx      Social History   Tobacco Use   Smoking status: Former    Current packs/day: 0.00    Average packs/day: 0.5 packs/day for 15.0 years (7.5 ttl pk-yrs)    Types: Cigarettes    Start date: 06/07/2005    Quit date: 06/07/2020    Years since quitting: 4.0   Smokeless tobacco: Never  Vaping Use   Vaping status: Former  Substance Use Topics   Alcohol use: No   Drug use: No    Allergies as of 07/01/2024 - Review Complete 07/01/2024  Allergen Reaction Noted   Jardiance  [empagliflozin ] Other (See Comments) 08/07/2023   Shellfish allergy Nausea And Vomiting    Codeine  12/19/2010   Tramadol  08/19/2018    Review of Systems:    All systems reviewed and negative except where noted in HPI.   Physical Exam:  BP (!) 150/76   Pulse 75   Ht 5' (1.524 m)   Wt 190 lb (86.2 kg)   BMI 37.11 kg/m  No LMP recorded. (Menstrual status: Perimenopausal).  General:   Alert,  Well-developed, obese, pleasant and cooperative in NAD Lungs:  Respirations  even and unlabored.  Clear throughout to auscultation.   No wheezes, crackles, or rhonchi. No acute distress. Heart:  Regular rate and rhythm; no murmurs, clicks, rubs, or gallops. Abdomen:  Normal bowel sounds.  No bruits.  Soft, and obese without masses, hepatosplenomegaly or hernias noted.  Mild LUQ Tenderness.  Rest of abdomen is not tender.  No guarding or rebound tenderness.    Rectal:  Mild Perianal skin irritation.  Small external hemorrhoid skin tag.  No evidence of thrombosed external hemorrhoids.  No anal fissure or rectal lesions.  No internal rectal masses. Neurologic:  Alert and oriented x3;  grossly normal neurologically. Psych:  Alert and cooperative. Normal mood and affect.  Chaperone for Exam:    Imaging Studies: MM 3D SCREENING MAMMOGRAM BILATERAL BREAST Result Date: 06/18/2024 CLINICAL DATA:  Screening. EXAM: DIGITAL SCREENING BILATERAL MAMMOGRAM WITH TOMOSYNTHESIS AND CAD TECHNIQUE: Bilateral screening digital craniocaudal and mediolateral oblique mammograms were obtained. Bilateral screening digital breast tomosynthesis was performed. The images were evaluated with computer-aided detection. COMPARISON:  Previous exam(s). ACR Breast Density Category b: There are scattered areas of fibroglandular density. FINDINGS: There are no findings suspicious for malignancy. IMPRESSION: No mammographic evidence of malignancy. A result letter of this screening mammogram will be mailed directly to the patient. RECOMMENDATION: Screening mammogram in one year. (Code:SM-B-01Y) BI-RADS CATEGORY  1: Negative. Electronically Signed   By: Reyes Phi M.D.   On: 06/18/2024 16:34    Labs: CBC    Component Value Date/Time   WBC 7.6 12/31/2023 1619   RBC 4.87 12/31/2023 1619   HGB 14.1 12/31/2023 1619   HCT 42.7 12/31/2023 1619   PLT 349.0 12/31/2023 1619   MCV 87.7 12/31/2023 1619    CMP     Component Value Date/Time   NA 140 12/31/2023 1619   NA 140 12/04/2022 1114   K 4.3 12/31/2023 1619    CL 105 12/31/2023 1619   CO2 28 12/31/2023 1619   GLUCOSE 105 (H) 12/31/2023 1619   BUN 10 12/31/2023 1619   BUN 15 12/04/2022 1114   CREATININE 0.93 12/31/2023 1619   CREATININE 0.91 06/14/2020 1421   CALCIUM  10.1 12/31/2023 1619   PROT 7.3 03/30/2024 1532   PROT 7.1 12/04/2022 1114   ALBUMIN 4.6 03/30/2024 1532   ALBUMIN 4.6 12/04/2022 1114   AST 16 03/30/2024 1532   ALT 17 03/30/2024 1532   ALKPHOS 86 03/30/2024 1532   BILITOT 0.7 03/30/2024 1532   BILITOT 0.5 12/04/2022 1114   GFRNONAA >60 09/14/2021 2333   GFRNONAA 71 06/14/2020 1421   GFRAA 82 06/14/2020 1421    Assessment and Plan:   SHARMAIN LASTRA is a 58 y.o. y/o Watts has been referred for:   Rectal Bleeding - Scheduling Colonoscopy I discussed risks of colonoscopy with patient to include risk of bleeding, colon perforation, and risk of sedation.  Patient expressed understanding and agrees to proceed with colonoscopy.   Irregular Bowel Habits: Constipation and Diarrhea -Start OTC Benefiber Powder. Mix 1 - 2 Tablespoons in 6 - 8 ounces of a Drink Once Daily. -Start OTC Miralax Powder.  Mix 1 capful in 6 to 8 ounces of a drink once daily - Recommend high-fiber diet, 30 g of fiber daily.  Eat fruits, vegetables, and whole grains. - Drink 64 ounces of water / fluids daily.    Follow up 2 - 3 months with TG.  Also follow-up based on colonoscopy results.  Ellouise Console, PA-C

## 2024-07-01 ENCOUNTER — Encounter: Payer: Self-pay | Admitting: Physician Assistant

## 2024-07-01 ENCOUNTER — Ambulatory Visit
Admission: RE | Admit: 2024-07-01 | Discharge: 2024-07-01 | Disposition: A | Discharge status: Home / Self Care | Source: Ambulatory Visit | Attending: Acute Care | Admitting: Acute Care

## 2024-07-01 ENCOUNTER — Ambulatory Visit: Admitting: Physician Assistant

## 2024-07-01 VITALS — BP 150/76 | HR 75 | Ht 60.0 in | Wt 190.0 lb

## 2024-07-01 DIAGNOSIS — Z87891 Personal history of nicotine dependence: Secondary | ICD-10-CM

## 2024-07-01 DIAGNOSIS — Z122 Encounter for screening for malignant neoplasm of respiratory organs: Secondary | ICD-10-CM

## 2024-07-01 DIAGNOSIS — K625 Hemorrhage of anus and rectum: Secondary | ICD-10-CM

## 2024-07-01 DIAGNOSIS — R198 Other specified symptoms and signs involving the digestive system and abdomen: Secondary | ICD-10-CM

## 2024-07-01 MED ORDER — NA SULFATE-K SULFATE-MG SULF 17.5-3.13-1.6 GM/177ML PO SOLN: 1.0000 | Freq: Once | ORAL | 0 refills | Status: AC

## 2024-07-01 NOTE — Patient Instructions (Addendum)
.    Start OTC Benefiber Powder. Mix 1 - 2 Tablespoons in 6 - 8 ounces of a Drink Once Daily. Drink 64 ounces of water / fluids Daily.   For constipation: Start OTC Miralax Powder Mix 1 capful in 6 to 8 ounces of a drink once daily  Recommend high-fiber diet, 30 g of fiber daily Eat fruits, vegetables, and whole grains Drink 64 ounces of water / fluids daily.    Please follow up sooner if symptoms increase or worsen  Due to recent changes in healthcare laws, you may see the results of your imaging and laboratory studies on MyChart before your provider has had a chance to review them.  We understand that in some cases there may be results that are confusing or concerning to you. Not all laboratory results come back in the same time frame and the provider may be waiting for multiple results in order to interpret others.  Please give us  48 hours in order for your provider to thoroughly review all the results before contacting the office for clarification of your results.   Thank you for trusting me with your gastrointestinal care!   Ellouise Console, PA-C _______________________________________________________  If your blood pressure at your visit was 140/90 or greater, please contact your primary care physician to follow up on this.  _______________________________________________________  If you are age 52 or older, your body mass index should be between 23-30. Your Body mass index is 37.11 kg/m. If this is out of the aforementioned range listed, please consider follow up with your Primary Care Provider.  If you are age 3 or younger, your body mass index should be between 19-25. Your Body mass index is 37.11 kg/m. If this is out of the aformentioned range listed, please consider follow up with your Primary Care Provider.   ________________________________________________________  The [facility] providers would like to encourage you to use MYCHART to communicate with providers for  non-urgent requests or questions.  Due to long hold times on the telephone, sending your provider a message by [facility] may be a faster and more efficient way to get a response.  Please allow 48 business hours for a response.  Please remember that this is for non-urgent requests.  _______________________________________________________

## 2024-07-03 ENCOUNTER — Encounter: Payer: Self-pay | Admitting: Internal Medicine

## 2024-07-10 ENCOUNTER — Ambulatory Visit (AMBULATORY_SURGERY_CENTER): Admitting: Internal Medicine

## 2024-07-10 ENCOUNTER — Encounter: Payer: Self-pay | Admitting: Internal Medicine

## 2024-07-10 VITALS — BP 106/58 | HR 68 | Resp 13

## 2024-07-10 DIAGNOSIS — D125 Benign neoplasm of sigmoid colon: Secondary | ICD-10-CM

## 2024-07-10 DIAGNOSIS — K5730 Diverticulosis of large intestine without perforation or abscess without bleeding: Secondary | ICD-10-CM

## 2024-07-10 DIAGNOSIS — D120 Benign neoplasm of cecum: Secondary | ICD-10-CM | POA: Diagnosis not present

## 2024-07-10 DIAGNOSIS — D123 Benign neoplasm of transverse colon: Secondary | ICD-10-CM | POA: Diagnosis not present

## 2024-07-10 DIAGNOSIS — K648 Other hemorrhoids: Secondary | ICD-10-CM | POA: Diagnosis not present

## 2024-07-10 DIAGNOSIS — K625 Hemorrhage of anus and rectum: Secondary | ICD-10-CM | POA: Diagnosis not present

## 2024-07-10 MED ORDER — SODIUM CHLORIDE 0.9 % IV SOLN: 500.0000 mL | Freq: Once | INTRAVENOUS | Status: DC

## 2024-07-10 NOTE — Progress Notes (Signed)
 Pt's states no medical or surgical changes since previsit or office visit.

## 2024-07-10 NOTE — Progress Notes (Signed)
 Vss nad trans to pacu

## 2024-07-10 NOTE — Patient Instructions (Signed)

## 2024-07-10 NOTE — Progress Notes (Signed)
 Called to room to assist during endoscopic procedure.  Patient ID and intended procedure confirmed with present staff. Received instructions for my participation in the procedure from the performing physician.

## 2024-07-10 NOTE — Progress Notes (Signed)
 See office note dated 07/01/2024 for details and current H&P  Patient presenting for colonoscopy to evaluate rectal bleeding and irregular bowel habits, no prior colonoscopy.  Patient remains appropriate for colonoscopy in the [facility] today

## 2024-07-10 NOTE — Op Note (Signed)
 [facility] Patient Name: Sarah Watts Procedure Date: 07/10/2024 1:49 PM MRN: [id number] Endoscopist: Gordy CHRISTELLA Starch , MD, [id number] Age: 58 Referring MD:  Date of Birth: [birth year] Gender: Female Account #: [id number] Procedure:                Colonoscopy Indications:              Rectal bleeding, no previous colonoscopy Medicines:                Monitored Anesthesia Care Procedure:                Pre-Anesthesia Assessment:                           - Prior to the procedure, a History and Physical                            was performed, and patient medications and                            allergies were reviewed. The patient's tolerance of                            previous anesthesia was also reviewed. The risks                            and benefits of the procedure and the sedation                            options and risks were discussed with the patient.                            All questions were answered, and informed consent                            was obtained. Prior Anticoagulants: The patient has                            taken no anticoagulant or antiplatelet agents. ASA                            Grade Assessment: III - A patient with severe                            systemic disease. After reviewing the risks and                            benefits, the patient was deemed in satisfactory                            condition to undergo the procedure.                           After obtaining informed consent, the colonoscope  was passed under direct vision. Throughout the                            procedure, the patient's blood pressure, pulse, and                            oxygen  saturations were monitored continuously. The                            Olympus Scope [id number] was introduced through the                            anus and advanced to the cecum, identified by                            appendiceal  orifice and ileocecal valve. The                            colonoscopy was performed without difficulty. The                            patient tolerated the procedure well. The quality                            of the bowel preparation was good. The ileocecal                            valve, appendiceal orifice, and rectum were                            photographed. Scope In: 1:56:36 PM Scope Out: 2:15:55 PM Scope Withdrawal Time: 0 hours 17 minutes 40 seconds  Total Procedure Duration: 0 hours 19 minutes 19 seconds  Findings:                 The digital rectal exam was normal.                           An 18 mm polyp was found in the cecum. The polyp                            was multi-lobulated. The polyp was removed with a                            piecemeal technique using a cold snare. Resection                            and retrieval were complete.                           A 6 mm polyp was found in the proximal transverse                            colon. The polyp was sessile. The polyp was removed  with a cold snare. Resection and retrieval were                            complete.                           A 5 mm polyp was found in the sigmoid colon. The                            polyp was sessile. The polyp was removed with a                            cold snare. Resection and retrieval were complete.                           A few small-mouthed diverticula were found in the                            sigmoid colon.                           Internal hemorrhoids were found during                            retroflexion. The hemorrhoids were small. Complications:            No immediate complications. Estimated Blood Loss:     Estimated blood loss was minimal. Impression:               - One 18 mm polyp in the cecum, removed piecemeal                            using a cold snare. Resected and retrieved.                           - One 6  mm polyp in the proximal transverse colon,                            removed with a cold snare. Resected and retrieved.                           - One 5 mm polyp in the sigmoid colon, removed with                            a cold snare. Resected and retrieved.                           - Mild diverticulosis in the sigmoid colon.                           - Small internal hemorrhoids. Recommendation:           - Patient has a contact number available for  emergencies. The signs and symptoms of potential                            delayed complications were discussed with the                            patient. Return to normal activities tomorrow.                            Written discharge instructions were provided to the                            patient.                           - Resume previous diet.                           - Continue present medications.                           - Await pathology results.                           - Repeat colonoscopy is recommended for                            surveillance. The colonoscopy date will be                            determined after pathology results from today's                            exam become available for review. Gordy CHRISTELLA Starch, MD 07/10/2024 2:21:43 PM This report has been signed electronically.

## 2024-07-13 ENCOUNTER — Other Ambulatory Visit: Payer: Self-pay

## 2024-07-13 ENCOUNTER — Telehealth: Payer: Self-pay

## 2024-07-13 DIAGNOSIS — Z122 Encounter for screening for malignant neoplasm of respiratory organs: Secondary | ICD-10-CM

## 2024-07-13 DIAGNOSIS — Z87891 Personal history of nicotine dependence: Secondary | ICD-10-CM

## 2024-07-13 NOTE — Telephone Encounter (Signed)
Unable to speak to pt.  Left HIPAA compliant voicemail.

## 2024-07-15 ENCOUNTER — Other Ambulatory Visit: Payer: Self-pay | Admitting: Internal Medicine

## 2024-07-15 DIAGNOSIS — E269 Hyperaldosteronism, unspecified: Secondary | ICD-10-CM

## 2024-07-15 DIAGNOSIS — J301 Allergic rhinitis due to pollen: Secondary | ICD-10-CM

## 2024-07-15 LAB — HM COLONOSCOPY

## 2024-07-15 LAB — SURGICAL PATHOLOGY

## 2024-07-24 ENCOUNTER — Ambulatory Visit: Payer: Self-pay | Admitting: Internal Medicine

## 2024-07-27 DIAGNOSIS — G2581 Restless legs syndrome: Secondary | ICD-10-CM | POA: Diagnosis not present

## 2024-07-27 DIAGNOSIS — R252 Cramp and spasm: Secondary | ICD-10-CM | POA: Diagnosis not present

## 2024-07-27 DIAGNOSIS — I83892 Varicose veins of left lower extremities with other complications: Secondary | ICD-10-CM | POA: Diagnosis not present

## 2024-07-27 DIAGNOSIS — R600 Localized edema: Secondary | ICD-10-CM | POA: Diagnosis not present

## 2024-07-30 ENCOUNTER — Ambulatory Visit: Admitting: Internal Medicine

## 2024-08-05 ENCOUNTER — Ambulatory Visit: Payer: Self-pay | Admitting: Internal Medicine

## 2024-08-05 ENCOUNTER — Encounter: Payer: Self-pay | Admitting: Internal Medicine

## 2024-08-05 ENCOUNTER — Ambulatory Visit (INDEPENDENT_AMBULATORY_CARE_PROVIDER_SITE_OTHER): Admitting: Internal Medicine

## 2024-08-05 VITALS — BP 134/76 | HR 61 | Temp 98.3°F | Ht 60.0 in | Wt 190.8 lb

## 2024-08-05 DIAGNOSIS — Z23 Encounter for immunization: Secondary | ICD-10-CM | POA: Diagnosis not present

## 2024-08-05 DIAGNOSIS — E118 Type 2 diabetes mellitus with unspecified complications: Secondary | ICD-10-CM | POA: Diagnosis not present

## 2024-08-05 DIAGNOSIS — J410 Simple chronic bronchitis: Secondary | ICD-10-CM

## 2024-08-05 DIAGNOSIS — I10 Essential (primary) hypertension: Secondary | ICD-10-CM | POA: Diagnosis not present

## 2024-08-05 DIAGNOSIS — E039 Hypothyroidism, unspecified: Secondary | ICD-10-CM

## 2024-08-05 DIAGNOSIS — J432 Centrilobular emphysema: Secondary | ICD-10-CM

## 2024-08-05 DIAGNOSIS — Z7985 Long-term (current) use of injectable non-insulin antidiabetic drugs: Secondary | ICD-10-CM

## 2024-08-05 DIAGNOSIS — E538 Deficiency of other specified B group vitamins: Secondary | ICD-10-CM | POA: Diagnosis not present

## 2024-08-05 DIAGNOSIS — G4733 Obstructive sleep apnea (adult) (pediatric): Secondary | ICD-10-CM

## 2024-08-05 LAB — BASIC METABOLIC PANEL WITH GFR
BUN: 18 mg/dL (ref 6–23)
CO2: 27 meq/L (ref 19–32)
Calcium: 10.4 mg/dL (ref 8.4–10.5)
Chloride: 103 meq/L (ref 96–112)
Creatinine, Ser: 0.97 mg/dL (ref 0.40–1.20)
GFR: 63.99 mL/min (ref >60.00)
Glucose, Bld: 122 mg/dL — ABNORMAL HIGH (ref 70–99)
Potassium: 4.4 meq/L (ref 3.5–5.1)
Sodium: 139 meq/L (ref 135–145)

## 2024-08-05 LAB — CBC WITH DIFFERENTIAL/PLATELET
Basophils Absolute: 0.1 K/uL (ref 0.0–0.1)
Basophils Relative: 0.7 % (ref 0.0–3.0)
Eosinophils Absolute: 0.2 K/uL (ref 0.0–0.7)
Eosinophils Relative: 2.5 % (ref 0.0–5.0)
HCT: 41.3 % (ref 36.0–46.0)
Hemoglobin: 13.7 g/dL (ref 12.0–15.0)
Lymphocytes Relative: 24.7 % (ref 12.0–46.0)
Lymphs Abs: 1.8 K/uL (ref 0.7–4.0)
MCHC: 33.1 g/dL (ref 30.0–36.0)
MCV: 86.2 fl (ref 78.0–100.0)
Monocytes Absolute: 0.6 K/uL (ref 0.1–1.0)
Monocytes Relative: 8 % (ref 3.0–12.0)
Neutro Abs: 4.8 K/uL (ref 1.4–7.7)
Neutrophils Relative %: 64.1 % (ref 43.0–77.0)
Platelets: 347 K/uL (ref 150.0–400.0)
RBC: 4.8 Mil/uL (ref 3.87–5.11)
RDW: 13.4 % (ref 11.5–15.5)
WBC: 7.5 K/uL (ref 4.0–10.5)

## 2024-08-05 LAB — FOLATE: Folate: 9.4 ng/mL (ref >5.9)

## 2024-08-05 LAB — VITAMIN B12: Vitamin B-12: 179 pg/mL — ABNORMAL LOW (ref 211–911)

## 2024-08-05 LAB — URINALYSIS, ROUTINE W REFLEX MICROSCOPIC
Bilirubin Urine: NEGATIVE
Hgb urine dipstick: NEGATIVE
Ketones, ur: NEGATIVE
Nitrite: NEGATIVE
Specific Gravity, Urine: <=1.005 — AB (ref 1.000–1.030)
Total Protein, Urine: NEGATIVE
Urine Glucose: NEGATIVE
Urobilinogen, UA: 0.2 (ref 0.0–1.0)
pH: 6.5 (ref 5.0–8.0)

## 2024-08-05 LAB — MICROALBUMIN / CREATININE URINE RATIO
Creatinine,U: 25.2 mg/dL
Microalb Creat Ratio: UNDETERMINED mg/g (ref 0.0–30.0)
Microalb, Ur: <0.7 mg/dL

## 2024-08-05 LAB — HEMOGLOBIN A1C: Hgb A1c MFr Bld: 6.9 % — ABNORMAL HIGH (ref 4.6–6.5)

## 2024-08-05 LAB — TSH: TSH: 0.83 u[IU]/mL (ref 0.35–5.50)

## 2024-08-05 MED ORDER — BREZTRI AEROSPHERE 160-9-4.8 MCG/ACT IN AERO: 2.0000 | INHALATION_SPRAY | Freq: Two times a day (BID) | RESPIRATORY_TRACT | 1 refills | Status: AC

## 2024-08-05 MED ORDER — IRBESARTAN 300 MG PO TABS: 300.0000 mg | ORAL_TABLET | Freq: Every day | ORAL | 0 refills | Status: AC

## 2024-08-05 MED ORDER — BREZTRI AEROSPHERE 160-9-4.8 MCG/ACT IN AERO: 2.0000 | INHALATION_SPRAY | Freq: Two times a day (BID) | RESPIRATORY_TRACT | 1 refills | Status: DC

## 2024-08-05 MED ORDER — CYANOCOBALAMIN 2000 MCG PO TABS: 2000.0000 ug | ORAL_TABLET | Freq: Every day | ORAL | 0 refills | Status: AC

## 2024-08-05 MED ORDER — COVID-19 MRNA VAC-TRIS(PFIZER) 30 MCG/0.3ML IM SUSY: 0.3000 mL | PREFILLED_SYRINGE | Freq: Once | INTRAMUSCULAR | 0 refills | Status: AC

## 2024-08-05 MED ORDER — ZEPBOUND 2.5 MG/0.5ML ~~LOC~~ SOAJ: 2.5000 mg | SUBCUTANEOUS | 0 refills | Status: DC

## 2024-08-05 MED ORDER — LEVOTHYROXINE SODIUM 75 MCG PO TABS: 75.0000 ug | ORAL_TABLET | Freq: Every day | ORAL | 0 refills | Status: AC

## 2024-08-05 NOTE — Patient Instructions (Signed)

## 2024-08-05 NOTE — Progress Notes (Signed)
 Subjective:  Patient ID: Sarah Watts, female    DOB: 03/23/1966  Age: 58 y.o. MRN: [id number]  CC: Sleep Apnea (Patient has a new DX and wants to discuss this ), Hypertension, Hypothyroidism, and Diabetes   HPI Sarah Watts presents for f/up ---    Discussed the use of AI scribe software for clinical note transcription with the patient, who gave verbal consent to proceed.  History of Present Illness Sarah Watts is a 58 year old female who presents with fatigue and follow-up for colonoscopy findings.  She experiences persistent fatigue, which has improved but remains present. The fatigue is less severe than previously experienced.  Approximately three weeks ago, she underwent a colonoscopy during which three polyps were removed, including one 18 mm precancerous polyp. Her stomach symptoms have improved since the removal of the polyps, and she no longer experiences the previous discomfort.  She experiences leg pain and a burning sensation by the end of the day, which worsens at work. Her legs swell, and she experiences pain in certain areas while walking. She was informed that she might have lymphedema.  She has a history of sleep apnea, which was noted during a previous procedure when she stopped breathing three times under anesthesia. She was previously tested and found to be borderline. She experiences loud snoring and episodes of apnea during sleep.  She experiences shortness of breath but no chest pain. She attributes the shortness of breath to her lungs and has a history of smoking, though she quit three years ago, having only smoked two cigarettes since her diagnosis.     Outpatient Medications Prior to Visit  Medication Sig Dispense Refill   albuterol  (VENTOLIN  HFA) 108 (90 Base) MCG/ACT inhaler Inhale 2 puffs into the lungs every 6 (six) hours as needed for wheezing or shortness of breath. 18 g 5   baclofen  (LIORESAL ) 10 MG tablet Take 1 tablet (10 mg total) by  mouth 3 (three) times daily. 30 each 0   EQ ASPIRIN LOW DOSE 81 MG chewable tablet Chew 81 mg by mouth daily.     Ergocalciferol  50 MCG (2000 UT) CAPS 1 capsule.     Esomeprazole  Magnesium (NEXIUM  PO) Take 1 tablet by mouth daily.     famotidine (PEPCID) 10 MG tablet Take 10 mg by mouth.     fluocinonide -emollient (LIDEX -E) 0.05 % cream Apply 1 Application topically 2 (two) times daily. 60 g 2   fluticasone  (FLONASE ) 50 MCG/ACT nasal spray Place 2 sprays into both nostrils daily. 16 g 0   levocetirizine (XYZAL ) 5 MG tablet TAKE 1 TABLET BY MOUTH ONCE DAILY IN THE EVENING 90 tablet 0   rosuvastatin  (CRESTOR ) 10 MG tablet Take 1 tablet by mouth once daily 90 tablet 0   spironolactone  (ALDACTONE ) 50 MG tablet Take 1 tablet by mouth once daily 90 tablet 0   Budeson-Glycopyrrol-Formoterol (BREZTRI  AEROSPHERE) 160-9-4.8 MCG/ACT AERO Inhale 2 puffs into the lungs 2 (two) times daily. 32.1 g 1   irbesartan  (AVAPRO ) 300 MG tablet Take 1 tablet (300 mg total) by mouth daily. 90 tablet 0   levothyroxine  (SYNTHROID ) 75 MCG tablet Take 1 tablet (75 mcg total) by mouth daily. 90 tablet 0   promethazine -dextromethorphan (PROMETHAZINE -DM) 6.25-15 MG/5ML syrup Take 5 mLs by mouth 4 (four) times daily as needed. 118 mL 0   No facility-administered medications prior to visit.    ROS Review of Systems  Constitutional:  Positive for fatigue. Negative for appetite change, chills, diaphoresis and unexpected weight  change.  HENT: Negative.    Eyes: Negative.   Respiratory:  Positive for apnea and shortness of breath. Negative for cough, chest tightness and wheezing.   Cardiovascular: Negative.  Negative for chest pain, palpitations and leg swelling.  Gastrointestinal: Negative.  Negative for abdominal pain, blood in stool, constipation, diarrhea, nausea and vomiting.  Endocrine: Negative.  Negative for cold intolerance and heat intolerance.  Genitourinary: Negative.  Negative for difficulty urinating and dysuria.   Musculoskeletal: Negative.  Negative for arthralgias and myalgias.  Skin: Negative.   Neurological:  Negative for dizziness, weakness, light-headedness and numbness.  Hematological:  Negative for adenopathy. Does not bruise/bleed easily.  Psychiatric/Behavioral: Negative.      Objective:  BP 134/76 (BP Location: Left Arm, Patient Position: Sitting, Cuff Size: Normal)   Pulse 61   Temp 98.3 F (36.8 C) (Oral)   Ht 5' (1.524 m)   Wt 190 lb 12.8 oz (86.5 kg)   SpO2 95%   BMI 37.26 kg/m   BP Readings from Last 3 Encounters:  08/05/24 134/76  07/10/24 (!) 106/58  07/01/24 (!) 150/76    Wt Readings from Last 3 Encounters:  08/05/24 190 lb 12.8 oz (86.5 kg)  07/01/24 190 lb (86.2 kg)  03/30/24 188 lb 9.6 oz (85.5 kg)    Physical Exam Vitals reviewed.  Constitutional:      Appearance: Normal appearance.  HENT:     Nose: Nose normal.     Mouth/Throat:     Mouth: Mucous membranes are moist.  Eyes:     General: No scleral icterus.    Conjunctiva/sclera: Conjunctivae normal.  Cardiovascular:     Rate and Rhythm: Normal rate and regular rhythm.     Pulses: Normal pulses.     Heart sounds: No murmur heard.    No friction rub. No gallop.  Pulmonary:     Effort: Pulmonary effort is normal.     Breath sounds: No stridor. No wheezing, rhonchi or rales.  Abdominal:     General: Abdomen is flat.     Palpations: There is no mass.     Tenderness: There is no abdominal tenderness. There is no guarding.     Hernia: No hernia is present.  Musculoskeletal:        General: Normal range of motion.     Cervical back: Neck supple.     Right lower leg: No edema.     Left lower leg: No edema.  Lymphadenopathy:     Cervical: No cervical adenopathy.  Skin:    General: Skin is warm and dry.  Neurological:     General: No focal deficit present.     Mental Status: She is alert.  Psychiatric:        Mood and Affect: Mood normal.        Behavior: Behavior normal.     Lab Results   Component Value Date   WBC 7.5 08/05/2024   HGB 13.7 08/05/2024   HCT 41.3 08/05/2024   PLT 347.0 08/05/2024   GLUCOSE 122 (H) 08/05/2024   CHOL 99 03/30/2024   TRIG 148.0 03/30/2024   HDL 37.70 (L) 03/30/2024   LDLDIRECT 123.0 04/02/2023   LDLCALC 32 03/30/2024   ALT 17 03/30/2024   AST 16 03/30/2024   NA 139 08/05/2024   K 4.4 08/05/2024   CL 103 08/05/2024   CREATININE 0.97 08/05/2024   BUN 18 08/05/2024   CO2 27 08/05/2024   TSH 0.83 08/05/2024   HGBA1C 6.9 (H) 08/05/2024  MICROALBUR <0.7 08/05/2024    CT CHEST LUNG CA SCREEN LOW DOSE W/O CM Result Date: 07/12/2024 CLINICAL DATA:  58 year old female former smoker with 30 pack-year smoking history, quit smoking 4 years prior EXAM: CT CHEST WITHOUT CONTRAST LOW-DOSE FOR LUNG CANCER SCREENING TECHNIQUE: Multidetector CT imaging of the chest was performed following the standard protocol without IV contrast. RADIATION DOSE REDUCTION: This exam was performed according to the departmental dose-optimization program which includes automated exposure control, adjustment of the mA and/or kV according to patient size and/or use of iterative reconstruction technique. COMPARISON:  07/02/2023 screening chest CT FINDINGS: Cardiovascular: Normal heart size. No significant pericardial effusion/thickening. Atherosclerotic nonaneurysmal thoracic aorta. Normal caliber pulmonary arteries. Mediastinum/Nodes: No significant thyroid  nodules. Unremarkable esophagus. No pathologically enlarged axillary, mediastinal or hilar lymph nodes, noting limited sensitivity for the detection of hilar adenopathy on this noncontrast study. Lungs/Pleura: No pneumothorax. No pleural effusion. Mild centrilobular emphysema with diffuse bronchial wall thickening. No acute consolidative airspace disease or lung masses. No significant pulmonary nodules. Upper abdomen: No acute abnormality. Musculoskeletal: No aggressive appearing focal osseous lesions. Mild thoracic spondylosis.  IMPRESSION: Lung-RADS 1, negative. Continue annual screening with low-dose chest CT without contrast in 12 months. Aortic Atherosclerosis (ICD10-I70.0) and Emphysema (ICD10-J43.9). Electronically Signed   By: Selinda DELENA Blue M.D.   On: 07/12/2024 00:49    Assessment & Plan:   Essential hypertension- BP is well controlled. -     Basic metabolic panel with GFR; Future -     CBC with Differential/Platelet; Future -     TSH; Future -     Urinalysis, Routine w reflex microscopic; Future -     Irbesartan ; Take 1 tablet (300 mg total) by mouth daily.  Dispense: 90 tablet; Refill: 0  OBSTRUCTIVE SLEEP APNEA -     Pulmonary Visit  Type II diabetes mellitus with manifestations (HCC) -     Basic metabolic panel with GFR; Future -     Hemoglobin A1c; Future -     Microalbumin / creatinine urine ratio; Future -     Urinalysis, Routine w reflex microscopic; Future -     Irbesartan ; Take 1 tablet (300 mg total) by mouth daily.  Dispense: 90 tablet; Refill: 0 -     Mounjaro; Inject 2.5 mg into the skin once a week.  Dispense: 2 mL; Refill: 0  B12 deficiency -     Vitamin B12; Future -     Folate; Future -     Cyanocobalamin ; Take 1 tablet (2,000 mcg total) by mouth daily.  Dispense: 90 tablet; Refill: 0  Acquired hypothyroidism- She is euthyroid. -     TSH; Future -     Levothyroxine  Sodium; Take 1 tablet (75 mcg total) by mouth daily.  Dispense: 90 tablet; Refill: 0  Need for immunization against influenza -     Flu vaccine trivalent PF, 6mos and older(Flulaval,Afluria,Fluarix,Fluzone)  Immunization due -     Varicella-zoster vaccine IM  Centrilobular emphysema (HCC) -     Breztri  Aerosphere; Inhale 2 puffs into the lungs 2 (two) times daily.  Dispense: 32.1 g; Refill: 1  Simple chronic bronchitis (HCC) -     Breztri  Aerosphere; Inhale 2 puffs into the lungs 2 (two) times daily.  Dispense: 32.1 g; Refill: 1  Other orders -     COVID-19 mRNA Vac-TriS(Pfizer); Inject 0.3 mLs into the muscle  once for 1 dose.  Dispense: 0.3 mL; Refill: 0     Follow-up: Return in about 6 months (around 02/03/2025).  Debby  Joshua, MD

## 2024-08-06 ENCOUNTER — Other Ambulatory Visit (HOSPITAL_COMMUNITY): Payer: Self-pay

## 2024-08-06 ENCOUNTER — Telehealth: Payer: Self-pay

## 2024-08-06 NOTE — Telephone Encounter (Signed)
 Pharmacy Patient Advocate Encounter   Received notification from CoverMyMeds that prior authorization for Zepbound 2.5mg /0.9ml is required/requested.   Insurance verification completed.   The patient is insured through [facility].   Per test claim: PA required; PA started via CoverMyMeds. KEY [id number] . Waiting for clinical questions to populate.

## 2024-08-06 NOTE — Telephone Encounter (Signed)
 Pharmacy Patient Advocate Encounter   Received notification from CoverMyMeds that prior authorization for Zepbound 2.5mg /0.16ml is required/requested.   Insurance verification completed.   The patient is insured through [facility].   Upon looking at the sleep study in the chart, the AHI was 3 events per hour which do not meet the criteria for obstructive sleep apnea syndrome. I did see the patient has an A1C level of 6.9 and a diagnosis of T2DM. There is a better chance of getting Mounjaro approved vs. Zepbound.

## 2024-08-06 NOTE — Telephone Encounter (Signed)
 Pharmacy Patient Advocate Encounter   Received notification from CoverMyMeds that prior authorization for Mounjaro 2.5mg /0.26ml is required/requested.   Insurance verification completed.   The patient is insured through [facility].   Per test claim: The current 28 day co-pay is, $25.  No PA needed at this time. This test claim was processed through [facility]- copay amounts may vary at other pharmacies due to pharmacy/plan contracts, or as the patient moves through the different stages of their insurance plan.     When I tried to submit the PA, it came back stating the medication Cloyde) is on the plan's list of covered drugs. PA not required at this time.   Please send in a new script for Mounjaro to the patient's pharmacy with the T2DM diagnosis. Thanks

## 2024-08-07 ENCOUNTER — Other Ambulatory Visit (HOSPITAL_COMMUNITY): Payer: Self-pay

## 2024-08-07 MED ORDER — MOUNJARO 2.5 MG/0.5ML ~~LOC~~ SOAJ: 2.5000 mg | SUBCUTANEOUS | 0 refills | Status: DC

## 2024-08-07 MED ORDER — MOUNJARO 2.5 MG/0.5ML ~~LOC~~ SOAJ
2.5000 mg | SUBCUTANEOUS | 0 refills | Status: DC
Start: 2024-08-07 — End: 2024-08-07
  Filled 2024-08-07: qty 2, 28d supply, fill #0

## 2024-08-07 NOTE — Telephone Encounter (Signed)
 Can you send the script with the DX code that they have in the message ?

## 2024-08-10 ENCOUNTER — Telehealth: Admitting: Physician Assistant

## 2024-08-10 DIAGNOSIS — Z87891 Personal history of nicotine dependence: Secondary | ICD-10-CM | POA: Diagnosis not present

## 2024-08-10 DIAGNOSIS — R112 Nausea with vomiting, unspecified: Secondary | ICD-10-CM | POA: Diagnosis not present

## 2024-08-10 DIAGNOSIS — R109 Unspecified abdominal pain: Secondary | ICD-10-CM | POA: Diagnosis not present

## 2024-08-10 DIAGNOSIS — R1110 Vomiting, unspecified: Secondary | ICD-10-CM

## 2024-08-10 DIAGNOSIS — R1013 Epigastric pain: Secondary | ICD-10-CM | POA: Diagnosis not present

## 2024-08-10 DIAGNOSIS — T50905A Adverse effect of unspecified drugs, medicaments and biological substances, initial encounter: Secondary | ICD-10-CM | POA: Diagnosis not present

## 2024-08-10 NOTE — Progress Notes (Signed)
  Because of need to make sure no concern for pancreatitis from the [facility] versus just bad stomach inflammation as a side effect, I feel your condition warrants further evaluation and I recommend that you be seen in a face-to-face visit.   NOTE: There will be NO CHARGE for this E-Visit   If you are having a true medical emergency, please call 911.     For an urgent face to face visit, [facility] has multiple urgent care centers for your convenience.  Click the link below for the full list of locations and hours, walk-in wait times, appointment scheduling options and driving directions:  Urgent Care - [place], [place], [place], [place], [place], [place]       Your MyChart E-visit questionnaire answers were reviewed by a board certified advanced clinical practitioner to complete your personal care plan based on your specific symptoms.    Thank you for using e-Visits.

## 2024-08-14 ENCOUNTER — Other Ambulatory Visit (HOSPITAL_COMMUNITY): Payer: Self-pay

## 2024-09-02 ENCOUNTER — Ambulatory Visit: Admitting: Physician Assistant

## 2024-09-02 ENCOUNTER — Encounter: Payer: Self-pay | Admitting: Physician Assistant

## 2024-09-02 VITALS — BP 128/70 | HR 57 | Ht 60.0 in | Wt 186.0 lb

## 2024-09-02 DIAGNOSIS — K5900 Constipation, unspecified: Secondary | ICD-10-CM | POA: Diagnosis not present

## 2024-09-02 DIAGNOSIS — Z860100 Personal history of colon polyps, unspecified: Secondary | ICD-10-CM | POA: Diagnosis not present

## 2024-09-02 DIAGNOSIS — K648 Other hemorrhoids: Secondary | ICD-10-CM

## 2024-09-02 DIAGNOSIS — R198 Other specified symptoms and signs involving the digestive system and abdomen: Secondary | ICD-10-CM | POA: Diagnosis not present

## 2024-09-02 MED ORDER — HYDROCORTISONE ACETATE 25 MG RE SUPP: 25.0000 mg | Freq: Every day | RECTAL | 1 refills | Status: AC

## 2024-09-02 NOTE — Progress Notes (Unsigned)
 Ellouise Console, PA-C [address] [place], [place]  [address] Phone: [phone]   Primary Care Physician: Joshua Debby CROME, MD  Primary Gastroenterologist:  Ellouise Console, PA-C / Dr. Gordy Starch   Chief Complaint: Follow-up rectal bleeding, constipation, and diarrhea       HPI:   Discussed the use of AI scribe software for clinical note transcription with the patient, who gave verbal consent to proceed.  CAROLLYN ETCHEVERRY is a 58 year old female who presents for follow-up after a colonoscopy with polyp removal.  07/10/2024 colonoscopy by Dr. Starch (to evaluate hematochezia): 18 mm tubulovillous adenoma polyp removed from cecum.  6 mm tubular adenoma polyp removed from transverse colon.  5 mm tubular adenoma polyp removed from sigmoid colon.  No dysplasia.  Sigmoid diverticulosis.  Small internal hemorrhoids.  Good prep.  Repeat colonoscopy in 6 months to ensure the large polyp was fully removed.  She was started on Benefiber and MiraLAX at last office visit. History of Present Illness She feels better overall and no longer experiences a certain feeling she had before. However, she has experienced minor rectal bleeding twice, once a month ago and again two weeks ago, which she attributes to hemorrhoids. Small internal hemorrhoids were noted during the colonoscopy.  No current issues with constipation and she reports regular bowel movements. She has used Miralax twice but often forgets to take it. She also uses Benefiber and inquires about the possibility of using both Benefiber and Miralax together. She drinks water in the morning to take her pills.   06/18/2024 labs: Normal hemoglobin 13.7, WBC 9.4.  Normal CBC.  No anemia.  Mildly elevated calcium  10.6.  Otherwise normal CMP.  Normal LFTs.  COVID-negative.  Flu and RSV negative.   PMH: Hx DVT, HTN, COPD, emphysema, sleep apnea, chronic bronchitis, hyperparathyroidism, hypothyroidism, type 2 diabetes, CKD 3, dyslipidemia, tobacco  abuse, GERD, hepatic steatosis.  Takes 81 mg aspirin daily.  No other blood thinners.  COPD is stable on inhalers.   10/2022 abdominal MRI with and without contrast: (To evaluate left side abdominal pain) 1. Normal appearance of the bilateral adrenal glands without focal nodularity/mass 2. Diffuse hepatic steatosis with a geographic area of more prominent fatty infiltration involving segment VIII/V which can be a cause of abdominal pain in the setting of steatohepatitis. 3. Prominent right upper quadrant lymph nodes, likely reactive.  Current Outpatient Medications  Medication Sig Dispense Refill   albuterol  (VENTOLIN  HFA) 108 (90 Base) MCG/ACT inhaler Inhale 2 puffs into the lungs every 6 (six) hours as needed for wheezing or shortness of breath. 18 g 5   baclofen  (LIORESAL ) 10 MG tablet Take 1 tablet (10 mg total) by mouth 3 (three) times daily. 30 each 0   budesonide-glycopyrrolate-formoterol (BREZTRI  AEROSPHERE) 160-9-4.8 MCG/ACT AERO inhaler Inhale 2 puffs into the lungs 2 (two) times daily. 32.1 g 1   cyanocobalamin  2000 MCG tablet Take 1 tablet (2,000 mcg total) by mouth daily. 90 tablet 0   EQ ASPIRIN LOW DOSE 81 MG chewable tablet Chew 81 mg by mouth daily.     Ergocalciferol  50 MCG (2000 UT) CAPS 1 capsule.     Esomeprazole  Magnesium (NEXIUM  PO) Take 1 tablet by mouth daily.     famotidine (PEPCID) 10 MG tablet Take 10 mg by mouth.     fluocinonide -emollient (LIDEX -E) 0.05 % cream Apply 1 Application topically 2 (two) times daily. 60 g 2   fluticasone  (FLONASE ) 50 MCG/ACT nasal spray Place 2 sprays  into both nostrils daily. 16 g 0   hydrocortisone (ANUSOL-HC) 25 MG suppository Place 1 suppository (25 mg total) rectally at bedtime for 24 days. 12 suppository 1   irbesartan  (AVAPRO ) 300 MG tablet Take 1 tablet (300 mg total) by mouth daily. 90 tablet 0   levocetirizine (XYZAL ) 5 MG tablet TAKE 1 TABLET BY MOUTH ONCE DAILY IN THE EVENING 90 tablet 0   levothyroxine  (SYNTHROID ) 75 MCG  tablet Take 1 tablet (75 mcg total) by mouth daily. 90 tablet 0   rosuvastatin  (CRESTOR ) 10 MG tablet Take 1 tablet by mouth once daily 90 tablet 0   spironolactone  (ALDACTONE ) 50 MG tablet Take 1 tablet by mouth once daily 90 tablet 0   tirzepatide (MOUNJARO) 2.5 MG/0.5ML Pen Inject 2.5 mg into the skin once a week. 2 mL 0   No current facility-administered medications for this visit.    Allergies as of 09/02/2024 - Review Complete 09/02/2024  Allergen Reaction Noted   Jardiance  [empagliflozin ] Other (See Comments) 08/07/2023   Shellfish allergy Nausea And Vomiting    Codeine Other (See Comments) 12/19/2010   Tramadol Nausea And Vomiting 08/19/2018    Past Medical History:  Diagnosis Date   Allergic rhinitis    Heart murmur    HTN (hypertension)    Irregular heartbeat     Past Surgical History:  Procedure Laterality Date   APPENDECTOMY     CESAREAN SECTION     CHOLECYSTECTOMY     FACIAL RECONSTRUCTION SURGERY     Left, due to birth defect   TONSILLECTOMY      Review of Systems:    All systems reviewed and negative except where noted in HPI.    Physical Exam:  BP 128/70   Pulse (!) 57   Ht 5' (1.524 m)   Wt 186 lb (84.4 kg)   BMI 36.33 kg/m  No LMP recorded. (Menstrual status: Perimenopausal).  General: Well-nourished, well-developed in no acute distress.  Neuro: Alert and oriented x 3.  Grossly intact.  Psych: Alert and cooperative, normal mood and affect.   Imaging Studies: No results found.  Labs: CBC    Component Value Date/Time   WBC 7.5 08/05/2024 1406   RBC 4.80 08/05/2024 1406   HGB 13.7 08/05/2024 1406   HCT 41.3 08/05/2024 1406   PLT 347.0 08/05/2024 1406   MCV 86.2 08/05/2024 1406   MCH 29.2 09/14/2021 2333   MCHC 33.1 08/05/2024 1406   RDW 13.4 08/05/2024 1406   LYMPHSABS 1.8 08/05/2024 1406   MONOABS 0.6 08/05/2024 1406   EOSABS 0.2 08/05/2024 1406   BASOSABS 0.1 08/05/2024 1406    CMP     Component Value Date/Time   NA 139  08/05/2024 1406   NA 140 12/04/2022 1114   K 4.4 08/05/2024 1406   CL 103 08/05/2024 1406   CO2 27 08/05/2024 1406   GLUCOSE 122 (H) 08/05/2024 1406   BUN 18 08/05/2024 1406   BUN 15 12/04/2022 1114   CREATININE 0.97 08/05/2024 1406   CREATININE 0.91 06/14/2020 1421   CALCIUM  10.4 08/05/2024 1406   PROT 7.3 03/30/2024 1532   PROT 7.1 12/04/2022 1114   ALBUMIN 4.6 03/30/2024 1532   ALBUMIN 4.6 12/04/2022 1114   AST 16 03/30/2024 1532   ALT 17 03/30/2024 1532   ALKPHOS 86 03/30/2024 1532   BILITOT 0.7 03/30/2024 1532   BILITOT 0.5 12/04/2022 1114   GFRNONAA >60 09/14/2021 2333   GFRNONAA 71 06/14/2020 1421   GFRAA 82 06/14/2020 1421  Assessment and Plan:   YENNI CARRA is a 58 y.o. y/o female returns for follow-up of:  1.  Internal hemorrhoids with minor rectal bleeding - Patient prefers non-surgical management. - Prescribed hydrocortisone suppository, one at bedtime for 12 days. - Provided GoodRx card for cost-effective medication access. - Discussed internal hemorrhoid banding procedure if bleeding persists.  2.  Irregular bowel habits: Constipation predominant - Continue Benefiber daily, adjust dosage as needed.  3.  Large tubulovillous adenoma 18 mm removed from cecum 07/10/2024. - 91-month repeat colonoscopy will be due March 2026.   Ellouise Console, PA-C  Follow up March 2026 for 6 month repeat Colonoscopy with Dr. Albertus.

## 2024-09-02 NOTE — Patient Instructions (Signed)
 We have sent the following medications to your pharmacy for you to pick up at your convenience: Hydrocortisone Suppositories 25 mg once daily at bedtime  Please follow up sooner if symptoms increase or worsen  Due to recent changes in healthcare laws, you may see the results of your imaging and laboratory studies on MyChart before your provider has had a chance to review them.  We understand that in some cases there may be results that are confusing or concerning to you. Not all laboratory results come back in the same time frame and the provider may be waiting for multiple results in order to interpret others.  Please give us  48 hours in order for your provider to thoroughly review all the results before contacting the office for clarification of your results.   Thank you for trusting me with your gastrointestinal care!   Ellouise Console, PA-C _______________________________________________________  If your blood pressure at your visit was 140/90 or greater, please contact your primary care physician to follow up on this.  _______________________________________________________  If you are age 55 or older, your body mass index should be between 23-30. Your Body mass index is 36.33 kg/m. If this is out of the aforementioned range listed, please consider follow up with your Primary Care Provider.  If you are age 72 or younger, your body mass index should be between 19-25. Your Body mass index is 36.33 kg/m. If this is out of the aformentioned range listed, please consider follow up with your Primary Care Provider.   ________________________________________________________  The [facility] providers would like to encourage you to use MYCHART to communicate with providers for non-urgent requests or questions.  Due to long hold times on the telephone, sending your provider a message by [facility] may be a faster and more efficient way to get a response.  Please allow 48 business hours for a response.   Please remember that this is for non-urgent requests.  _______________________________________________________

## 2024-09-03 ENCOUNTER — Encounter: Payer: Self-pay | Admitting: Physician Assistant

## 2024-09-07 ENCOUNTER — Encounter: Payer: Self-pay | Admitting: Primary Care

## 2024-09-07 ENCOUNTER — Ambulatory Visit: Admitting: Primary Care

## 2024-09-07 VITALS — BP 134/72 | HR 69 | Temp 97.6°F | Ht 60.0 in | Wt 187.8 lb

## 2024-09-07 DIAGNOSIS — J432 Centrilobular emphysema: Secondary | ICD-10-CM

## 2024-09-07 DIAGNOSIS — J4489 Other specified chronic obstructive pulmonary disease: Secondary | ICD-10-CM

## 2024-09-07 DIAGNOSIS — G4733 Obstructive sleep apnea (adult) (pediatric): Secondary | ICD-10-CM | POA: Diagnosis not present

## 2024-09-07 DIAGNOSIS — Z87891 Personal history of nicotine dependence: Secondary | ICD-10-CM

## 2024-09-07 NOTE — Progress Notes (Signed)
 @Patient  ID: Sarah Watts, female    DOB: 1965/11/07, 58 y.o.   MRN: [id number]  Chief Complaint  Patient presents with   Consult    Pt states Sarah Watts was told Sarah Watts stopped breathing during her colonoscopy. Pt states Sarah Watts had a sleep test done before and was told Sarah Watts borderline had OSA. States this was years ago     Referring provider: Joshua Debby CROME, MD  HPI: 58 year old female, former smoker. PMH significant for HTN, OSA, emphysema, hyperparathyroidism, type 2 diabetes, chronic kidney disease, dyslipidemia.    09/07/2024 Discussed the use of AI scribe software for clinical note transcription with the patient, who gave verbal consent to proceed.  History of Present Illness Sarah Watts is a 58 year old female with COPD who presents for a sleep consult.  Sarah Watts has a history of a sleep study conducted several years ago; Sarah Watts was told Sarah Watts had sleep apnea but was not prescribed a CPAP at that time. Recently, during a colonoscopy, Sarah Watts experienced apnea episodes, stopping breathing three times over approximately twenty-five minutes. Sarah Watts experiences symptoms of waking up gasping or choking and reports loud snoring. Her sleep is restless, and Sarah Watts is unsure of how many times Sarah Watts wakes up during the night. Her typical bedtime is between midnight and 1 AM due to work, and Sarah Watts starts her day around 9 or 10 AM. Sarah Watts does not operate heavy machinery and has experienced a weight loss of twelve pounds recently.  Sarah Watts has a history of COPD and emphysema but does not use oxygen . Her breathing is generally stable during the day unless Sarah Watts exerts herself, such as working quickly or walking uphill, which causes her to become winded. Sarah Watts was prescribed Breztri  for her COPD but cannot afford it, and Sarah Watts is currently using Symbicort 160, two puffs twice a day. Sarah Watts attempted to use Mounjaro for weight loss and diabetes management but discontinued it due to severe side effects, including vomiting.  In her social  history, Sarah Watts does not consume alcohol.  Sarah Watts is not on any sedatives or pain medications.     Allergies  Allergen Reactions   Jardiance  [Empagliflozin ] Other (See Comments)    Vaginal yeast   Shellfish Allergy Nausea And Vomiting   Codeine Other (See Comments)    REACTION: tachycardia   Tramadol Nausea And Vomiting    Nausea and vomiting    Immunization History  Administered Date(s) Administered   Hepatitis B 11/19/2017, 12/20/2017, 06/05/2018   Hepatitis B, ADULT 05/29/2018   Influenza Inj Mdck Quad With Preservative 08/23/2022   Influenza, Seasonal, Injecte, Preservative Fre 08/07/2023, 08/05/2024   Influenza,inj,Quad PF,6+ Mos 11/12/2014, 08/05/2017, 08/28/2018   Influenza-Unspecified 08/08/2013, 10/29/2014, 08/28/2019   Moderna Sars-Covid-2 Vaccination 10/14/2020   PNEUMOCOCCAL CONJUGATE-20 03/09/2021   Pneumococcal Polysaccharide-23 06/17/2013, 11/09/2017   Tdap 02/13/2013, 08/07/2023   Zoster Recombinant(Shingrix ) 04/10/2022, 08/05/2024    Past Medical History:  Diagnosis Date   Allergic rhinitis    Heart murmur    HTN (hypertension)    Irregular heartbeat     Tobacco History: Social History   Tobacco Use  Smoking Status Former   Current packs/day: 0.00   Average packs/day: 0.5 packs/day for 15.0 years (7.5 ttl pk-yrs)   Types: Cigarettes   Start date: 06/07/2005   Quit date: 06/07/2020   Years since quitting: 4.2  Smokeless Tobacco Never   Counseling given: Not Answered   Outpatient Medications Prior to Visit  Medication Sig Dispense Refill   albuterol  (VENTOLIN  HFA) 108 (  90 Base) MCG/ACT inhaler Inhale 2 puffs into the lungs every 6 (six) hours as needed for wheezing or shortness of breath. 18 g 5   baclofen  (LIORESAL ) 10 MG tablet Take 1 tablet (10 mg total) by mouth 3 (three) times daily. 30 each 0   budesonide-glycopyrrolate-formoterol (BREZTRI  AEROSPHERE) 160-9-4.8 MCG/ACT AERO inhaler Inhale 2 puffs into the lungs 2 (two) times daily. 32.1 g 1    cyanocobalamin  2000 MCG tablet Take 1 tablet (2,000 mcg total) by mouth daily. 90 tablet 0   EQ ASPIRIN LOW DOSE 81 MG chewable tablet Chew 81 mg by mouth daily.     Ergocalciferol  50 MCG (2000 UT) CAPS 1 capsule.     Esomeprazole  Magnesium (NEXIUM  PO) Take 1 tablet by mouth daily.     famotidine (PEPCID) 10 MG tablet Take 10 mg by mouth.     fluocinonide -emollient (LIDEX -E) 0.05 % cream Apply 1 Application topically 2 (two) times daily. 60 g 2   fluticasone  (FLONASE ) 50 MCG/ACT nasal spray Place 2 sprays into both nostrils daily. 16 g 0   hydrocortisone (ANUSOL-HC) 25 MG suppository Place 1 suppository (25 mg total) rectally at bedtime for 24 days. 12 suppository 1   irbesartan  (AVAPRO ) 300 MG tablet Take 1 tablet (300 mg total) by mouth daily. 90 tablet 0   levocetirizine (XYZAL ) 5 MG tablet TAKE 1 TABLET BY MOUTH ONCE DAILY IN THE EVENING 90 tablet 0   levothyroxine  (SYNTHROID ) 75 MCG tablet Take 1 tablet (75 mcg total) by mouth daily. 90 tablet 0   rosuvastatin  (CRESTOR ) 10 MG tablet Take 1 tablet by mouth once daily 90 tablet 0   spironolactone  (ALDACTONE ) 50 MG tablet Take 1 tablet by mouth once daily 90 tablet 0   SYMBICORT 160-4.5 MCG/ACT inhaler SMARTSIG:2 Puff(s) By Mouth Twice Daily     tirzepatide (MOUNJARO) 2.5 MG/0.5ML Pen Inject 2.5 mg into the skin once a week. 2 mL 0   No facility-administered medications prior to visit.    Review of Systems  Review of Systems  Constitutional: Negative.   Respiratory: Negative.     Physical Exam  BP 134/72   Pulse 69   Temp 97.6 F (36.4 C)   Ht 5' (1.524 m)   Wt 187 lb 12.8 oz (85.2 kg)   SpO2 98% Comment: ra  BMI 36.68 kg/m  Physical Exam Constitutional:      General: Sarah Watts is not in acute distress.    Appearance: Normal appearance.  HENT:     Head: Normocephalic and atraumatic.     Mouth/Throat:     Mouth: Mucous membranes are moist.     Pharynx: Oropharynx is clear.  Cardiovascular:     Rate and Rhythm: Normal rate and  regular rhythm.  Pulmonary:     Effort: Pulmonary effort is normal.     Breath sounds: Normal breath sounds. No wheezing or rhonchi.  Musculoskeletal:        General: Normal range of motion.  Skin:    General: Skin is warm and dry.  Neurological:     General: No focal deficit present.     Mental Status: Sarah Watts is alert and oriented to person, place, and time. Mental status is at baseline.  Psychiatric:        Mood and Affect: Mood normal.        Behavior: Behavior normal.        Thought Content: Thought content normal.        Judgment: Judgment normal.     Lab  Results:  CBC    Component Value Date/Time   WBC 7.5 08/05/2024 1406   RBC 4.80 08/05/2024 1406   HGB 13.7 08/05/2024 1406   HCT 41.3 08/05/2024 1406   PLT 347.0 08/05/2024 1406   MCV 86.2 08/05/2024 1406   MCH 29.2 09/14/2021 2333   MCHC 33.1 08/05/2024 1406   RDW 13.4 08/05/2024 1406   LYMPHSABS 1.8 08/05/2024 1406   MONOABS 0.6 08/05/2024 1406   EOSABS 0.2 08/05/2024 1406   BASOSABS 0.1 08/05/2024 1406    BMET    Component Value Date/Time   NA 139 08/05/2024 1406   NA 140 12/04/2022 1114   K 4.4 08/05/2024 1406   CL 103 08/05/2024 1406   CO2 27 08/05/2024 1406   GLUCOSE 122 (H) 08/05/2024 1406   BUN 18 08/05/2024 1406   BUN 15 12/04/2022 1114   CREATININE 0.97 08/05/2024 1406   CREATININE 0.91 06/14/2020 1421   CALCIUM  10.4 08/05/2024 1406   GFRNONAA >60 09/14/2021 2333   GFRNONAA 71 06/14/2020 1421   GFRAA 82 06/14/2020 1421    BNP    Component Value Date/Time   BNP 31.1 09/14/2021 2333    ProBNP    Component Value Date/Time   PROBNP 17.2 12/19/2010 0925    Imaging: No results found.   Assessment & Plan:    1. OBSTRUCTIVE SLEEP APNEA (Primary)  2. Centrilobular emphysema (HCC)  Assessment and Plan Assessment & Plan Suspected obstructive sleep apnea Reports loud snoring, waking up gasping or choking, and restless sleep. Previous sleep study was performed, but results are not  detailed; patient was told Sarah Watts had sleep apnea but was not diagnosed or prescribed CPAP. Recent colonoscopy noted three apneic episodes. Discussed risks of untreated sleep apnea, including increased risk for cardiac arrhythmias, stroke, diabetes, pulmonary hypertension, and accidents. Treatment options include weight loss, positional therapy, oral appliances, CPAP, and surgical options. CPAP is the gold standard for moderate to severe cases. Oral appliances are costly and not typically covered by insurance. Surgical options are generally not effective and carry risks. - Ordered home sleep study through [facility]. - Instructed to maintain normal sleep routine during the sleep study. - Advised to send a message via MyChart for results approximately two to three weeks after returning the sleep study. - Discussed potential for virtual visit to discuss results and treatment options if sleep apnea is confirmed.  Chronic obstructive pulmonary disease with emphysema COPD with emphysema, currently on Symbicort 162 puffs twice daily. Reports difficulty breathing with exertion, such as walking uphill or working quickly. Unable to afford Breztri  as prescribed by another provider. No current use of oxygen  therapy. - Continue Symbicort 160 two puffs twice daily.    Almarie LELON Ferrari, NP 09/07/2024

## 2024-09-07 NOTE — Patient Instructions (Addendum)
  VISIT SUMMARY: Today, you visited us  for a sleep consultation due to your history of sleep apnea and recent apnea episodes during a colonoscopy. You reported symptoms such as loud snoring, waking up gasping or choking, and restless sleep. We also reviewed your COPD management and current medications.  YOUR PLAN: -SUSPECTED OBSTRUCTIVE SLEEP APNEA: Obstructive sleep apnea is a condition where your airway becomes blocked during sleep, causing you to stop breathing temporarily. We discussed the risks of untreated sleep apnea, including heart problems, stroke, diabetes, and accidents. We have ordered a home sleep study through [facility] to confirm the diagnosis. Please maintain your normal sleep routine during the study. Once you return the sleep study, send a message via MyChart in about two to three weeks for the results. If sleep apnea is confirmed, we may discuss treatment options, including CPAP therapy, during a virtual visit.  -CHRONIC OBSTRUCTIVE PULMONARY DISEASE WITH EMPHYSEMA: COPD with emphysema is a chronic lung condition that makes it hard to breathe. You are currently using Symbicort 160, two puffs twice a day, which you should continue. We noted that you experience difficulty breathing with exertion and discussed that you are unable to afford Breztri , which was prescribed by another provider.  INSTRUCTIONS: Please complete the home sleep study as instructed and maintain your normal sleep routine during the study. Send a message via MyChart for the results approximately two to three weeks after returning the sleep study. We may schedule a virtual visit to discuss the results and potential treatment options if sleep apnea is confirmed.

## 2024-09-18 ENCOUNTER — Telehealth: Admitting: Emergency Medicine

## 2024-09-18 DIAGNOSIS — R051 Acute cough: Secondary | ICD-10-CM

## 2024-09-18 MED ORDER — BENZONATATE 100 MG PO CAPS: 100.0000 mg | ORAL_CAPSULE | Freq: Two times a day (BID) | ORAL | 0 refills | Status: DC | PRN

## 2024-09-18 NOTE — Progress Notes (Signed)
.  We are sorry that you are not feeling well.  Here is how we plan to help!  Based on your presentation I believe you most likely have A cough due to a virus.  This is called viral bronchitis and is best treated by rest, plenty of fluids and control of the cough.  You may use Ibuprofen or Tylenol as directed to help your symptoms.     In addition you may use A prescription cough medication called Tessalon  Perles 100mg . You may take 1-2 capsules every 8 hours as needed for your cough.    From your responses in the eVisit questionnaire you describe inflammation in the upper respiratory tract which is causing a significant cough.  This is commonly called Bronchitis and has four common causes:   Allergies Viral Infections Acid Reflux Bacterial Infection Allergies, viruses and acid reflux are treated by controlling symptoms or eliminating the cause. An example might be a cough caused by taking certain blood pressure medications. You stop the cough by changing the medication. Another example might be a cough caused by acid reflux. Controlling the reflux helps control the cough.      HOME CARE Only take medications as instructed by your medical team. Complete the entire course of an antibiotic. Drink plenty of fluids and get plenty of rest. Avoid close contacts especially the very young and the elderly Cover your mouth if you cough or cough into your sleeve. Always remember to wash your hands A steam or ultrasonic humidifier can help congestion.   GET HELP RIGHT AWAY IF: You develop worsening fever. You become short of breath You cough up blood. Your symptoms persist after you have completed your treatment plan MAKE SURE YOU  Understand these instructions. Will watch your condition. Will get help right away if you are not doing well or get worse.  Your e-visit answers were reviewed by a board certified advanced clinical practitioner to complete your personal care plan.  Depending on the  condition, your plan could have included both over the counter or prescription medications. If there is a problem please reply  once you have received a response from your provider. Your safety is important to us .  If you have drug allergies check your prescription carefully.    You can use MyChart to ask questions about today's visit, request a non-urgent call back, or ask for a work or school excuse for 24 hours related to this e-Visit. If it has been greater than 24 hours you will need to follow up with your provider, or enter a new e-Visit to address those concerns. You will get an e-mail in the next two days asking about your experience.  I hope that your e-visit has been valuable and will speed your recovery. Thank you for using e-visits.   I have spent 5 minutes in review of e-visit questionnaire, review and updating patient chart, medical decision making and response to patient.   Lamar Schlossman, PA-C

## 2024-09-18 NOTE — Progress Notes (Signed)
 Message sent to patient requesting further input regarding current symptoms. Awaiting patient response.

## 2024-09-24 ENCOUNTER — Other Ambulatory Visit: Payer: Self-pay | Admitting: Internal Medicine

## 2024-09-24 DIAGNOSIS — E785 Hyperlipidemia, unspecified: Secondary | ICD-10-CM

## 2024-09-27 ENCOUNTER — Telehealth: Admitting: Family

## 2024-09-27 DIAGNOSIS — J208 Acute bronchitis due to other specified organisms: Secondary | ICD-10-CM

## 2024-09-27 DIAGNOSIS — B9689 Other specified bacterial agents as the cause of diseases classified elsewhere: Secondary | ICD-10-CM

## 2024-09-27 MED ORDER — AZITHROMYCIN 250 MG PO TABS: ORAL_TABLET | ORAL | 0 refills | Status: DC

## 2024-09-27 MED ORDER — BENZONATATE 200 MG PO CAPS: 200.0000 mg | ORAL_CAPSULE | Freq: Two times a day (BID) | ORAL | 0 refills | Status: DC | PRN

## 2024-09-27 NOTE — Progress Notes (Signed)
 We are sorry that you are not feeling well.  Here is how we plan to help!  Based on your presentation I believe you most likely have A cough due to bacteria.  When patients have a fever and a productive cough with a change in color or increased sputum production, we are concerned about bacterial bronchitis.  If left untreated it can progress to pneumonia.  If your symptoms do not improve with your treatment plan it is important that you contact your provider.   I have prescribed Azithromyin 250 mg: two tablets now and then one tablet daily for 4 additonal days    In addition you may use A non-prescription cough medication called Robitussin DAC. Take 2 teaspoons every 8 hours or Delsym: take 2 teaspoons every 12 hours., A non-prescription cough medication called Mucinex DM: take 2 tablets every 12 hours., and A prescription cough medication called Tessalon  Perles 100mg . You may take 1-2 capsules every 8 hours as needed for your cough.   From your responses in the eVisit questionnaire you describe inflammation in the upper respiratory tract which is causing a significant cough.  This is commonly called Bronchitis and has four common causes:   Allergies Viral Infections Acid Reflux Bacterial Infection Allergies, viruses and acid reflux are treated by controlling symptoms or eliminating the cause. An example might be a cough caused by taking certain blood pressure medications. You stop the cough by changing the medication. Another example might be a cough caused by acid reflux. Controlling the reflux helps control the cough.  USE OF BRONCHODILATOR (RESCUE) INHALERS: There is a risk from using your bronchodilator too frequently.  The risk is that over-reliance on a medication which only relaxes the muscles surrounding the breathing tubes can reduce the effectiveness of medications prescribed to reduce swelling and congestion of the tubes themselves.  Although you feel brief relief from the bronchodilator  inhaler, your asthma may actually be worsening with the tubes becoming more swollen and filled with mucus.  This can delay other crucial treatments, such as oral steroid medications. If you need to use a bronchodilator inhaler daily, several times per day, you should discuss this with your provider.  There are probably better treatments that could be used to keep your asthma under control.     HOME CARE Only take medications as instructed by your medical team. Complete the entire course of an antibiotic. Drink plenty of fluids and get plenty of rest. Avoid close contacts especially the very young and the elderly Cover your mouth if you cough or cough into your sleeve. Always remember to wash your hands A steam or ultrasonic humidifier can help congestion.   GET HELP RIGHT AWAY IF: You develop worsening fever. You become short of breath You cough up blood. Your symptoms persist after you have completed your treatment plan MAKE SURE YOU  Understand these instructions. Will watch your condition. Will get help right away if you are not doing well or get worse.  Your e-visit answers were reviewed by a board certified advanced clinical practitioner to complete your personal care plan.  Depending on the condition, your plan could have included both over the counter or prescription medications. If there is a problem please reply  once you have received a response from your provider. Your safety is important to us .  If you have drug allergies check your prescription carefully.    You can use MyChart to ask questions about today's visit, request a non-urgent call back, or ask  for a work or school excuse for 24 hours related to this e-Visit. If it has been greater than 24 hours you will need to follow up with your provider, or enter a new e-Visit to address those concerns. You will get an e-mail in the next two days asking about your experience.  I hope that your e-visit has been valuable and will  speed your recovery. Thank you for using e-visits.   I have spent 5 minutes in review of e-visit questionnaire, review and updating patient chart, medical decision making and response to patient.   Bari Learn, FNP

## 2024-10-15 ENCOUNTER — Ambulatory Visit: Payer: Self-pay

## 2024-10-15 NOTE — Telephone Encounter (Signed)
 FYI Only or Action Required?: FYI only for provider: UC advised, no available appts with PCP.  Patient was last seen in primary care on 08/05/2024 by Joshua Debby CROME, MD.  Called Nurse Triage reporting Leg Injury.  Symptoms began a week ago.  Interventions attempted: OTC medications: Advil.  Symptoms are: gradually worsening.  Triage Disposition: See HCP Within 4 Hours (Or PCP Triage)  Patient/caregiver understands and will follow disposition?: Yes  Reason for Disposition  [1] SEVERE pain (e.g., excruciating pain, unable to do any normal activities) AND [2] not improved 2 hours after pain medicine/ice packs  Answer Assessment - Initial Assessment Questions Patient states that she injured her leg at work last week and pain has worsened since then. She was at work yesterday, but had to leave early due to pain. It is 9/10 pain and she has taken Advil at night, but it does not help. She reports mild swelling to left foot, denies any new numbness/tingling but does state she has poor circulation at baseline. No available appts with PCP, UC advised.   1. MECHANISM: How did the injury happen? (e.g., twisting injury, direct blow)      Patient Watts forward at work last week, also states she cut herself with meat slicer during injury  2. ONSET: When did the injury happen? (e.g., minutes, hours ago)      Last week  3. LOCATION: Where is the injury located?      Left hip down leg  4. APPEARANCE of INJURY: What does the injury look like?  (e.g., deformity of leg)     Patient states she thinks left foot has mild swelling  5. SEVERITY: Can you put weight on that leg? Can you walk?      Can bear weight and walk, but is painful   6. SIZE: For cuts, bruises, or swelling, ask: How large is it? (e.g., inches or centimeters)      No open cuts, bruises on leg  7. PAIN: Is there pain? If Yes, ask: How bad is the pain?   What does it keep you from doing? (Scale 0-10; or none, mild,  moderate, severe)     9/10; Patient states that she had to leave work early yesterday due to pain  8. TETANUS: For any breaks in the skin, ask: When was your last tetanus booster?     Unsure, but has had one in the past  9. OTHER SYMPTOMS: Do you have any other symptoms?      Denies any other symptoms  10. PREGNANCY: Is there any chance you are pregnant? When was your last menstrual period?       NA  Protocols used: Leg Injury-A-AH  Copied from CRM #[id number]. Topic: Clinical - Red Word Triage >> Oct 15, 2024  9:30 AM Sarah Watts wrote: Red Word that prompted transfer to Nurse Triage:  Sarah Watts at work last Tuesday (10-06-24) and hurt her both knees and now the left area is hurting from hip down to foot area. Pain level 9 to 10 or more. She is using workman comp for fall. Manager told her to let them know which doctor she is using.

## 2024-10-16 ENCOUNTER — Other Ambulatory Visit: Payer: Self-pay | Admitting: Internal Medicine

## 2024-10-16 DIAGNOSIS — J301 Allergic rhinitis due to pollen: Secondary | ICD-10-CM

## 2024-10-16 DIAGNOSIS — E269 Hyperaldosteronism, unspecified: Secondary | ICD-10-CM

## 2024-10-18 ENCOUNTER — Telehealth: Admitting: Physician Assistant

## 2024-10-18 DIAGNOSIS — K047 Periapical abscess without sinus: Secondary | ICD-10-CM | POA: Diagnosis not present

## 2024-10-18 MED ORDER — AMOXICILLIN-POT CLAVULANATE 875-125 MG PO TABS: 1.0000 | ORAL_TABLET | Freq: Two times a day (BID) | ORAL | 0 refills | Status: AC

## 2024-10-18 MED ORDER — NAPROXEN 500 MG PO TABS: 500.0000 mg | ORAL_TABLET | Freq: Two times a day (BID) | ORAL | 0 refills | Status: AC

## 2024-10-18 MED ORDER — CHLORHEXIDINE GLUCONATE 0.12 % MT SOLN: 15.0000 mL | Freq: Two times a day (BID) | OROMUCOSAL | 0 refills | Status: AC

## 2024-10-18 NOTE — Patient Instructions (Signed)
 " Sarah Watts, thank you for joining Sarah Velma Lunger, PA-C for today's virtual visit.  While this provider is not your primary care provider (PCP), if your PCP is located in our provider database this encounter information will be shared with them immediately following your visit.   A [facility] MyChart account gives you access to today's visit and all your visits, tests, and labs performed at [facility]  click here if you don't have a [facility] MyChart account or go to [email or web address]  Consent: (Patient) Sarah Watts provided verbal consent for this virtual visit at the beginning of the encounter.  Current Medications:  Current Outpatient Medications:    albuterol  (VENTOLIN  HFA) 108 (90 Base) MCG/ACT inhaler, Inhale 2 puffs into the lungs every 6 (six) hours as needed for wheezing or shortness of breath., Disp: 18 g, Rfl: 5   azithromycin  (ZITHROMAX ) 250 MG tablet, Take 500 mg once, then 250 mg for four days, Disp: 6 tablet, Rfl: 0   baclofen  (LIORESAL ) 10 MG tablet, Take 1 tablet (10 mg total) by mouth 3 (three) times daily., Disp: 30 each, Rfl: 0   benzonatate  (TESSALON ) 200 MG capsule, Take 1 capsule (200 mg total) by mouth 2 (two) times daily as needed for cough., Disp: 20 capsule, Rfl: 0   budesonide-glycopyrrolate-formoterol (BREZTRI  AEROSPHERE) 160-9-4.8 MCG/ACT AERO inhaler, Inhale 2 puffs into the lungs 2 (two) times daily., Disp: 32.1 g, Rfl: 1   cyanocobalamin  2000 MCG tablet, Take 1 tablet (2,000 mcg total) by mouth daily., Disp: 90 tablet, Rfl: 0   EQ ASPIRIN LOW DOSE 81 MG chewable tablet, Chew 81 mg by mouth daily., Disp: , Rfl:    Ergocalciferol  50 MCG (2000 UT) CAPS, 1 capsule., Disp: , Rfl:    Esomeprazole  Magnesium (NEXIUM  PO), Take 1 tablet by mouth daily., Disp: , Rfl:    famotidine (PEPCID) 10 MG tablet, Take 10 mg by mouth., Disp: , Rfl:    fluocinonide -emollient (LIDEX -E) 0.05 % cream, Apply 1 Application topically 2 (two) times  daily., Disp: 60 g, Rfl: 2   fluticasone  (FLONASE ) 50 MCG/ACT nasal spray, Place 2 sprays into both nostrils daily., Disp: 16 g, Rfl: 0   irbesartan  (AVAPRO ) 300 MG tablet, Take 1 tablet (300 mg total) by mouth daily., Disp: 90 tablet, Rfl: 0   levocetirizine (XYZAL ) 5 MG tablet, TAKE 1 TABLET BY MOUTH ONCE DAILY IN THE EVENING, Disp: 90 tablet, Rfl: 0   levothyroxine  (SYNTHROID ) 75 MCG tablet, Take 1 tablet (75 mcg total) by mouth daily., Disp: 90 tablet, Rfl: 0   rosuvastatin  (CRESTOR ) 10 MG tablet, Take 1 tablet by mouth once daily, Disp: 90 tablet, Rfl: 0   spironolactone  (ALDACTONE ) 50 MG tablet, Take 1 tablet by mouth once daily, Disp: 90 tablet, Rfl: 0   SYMBICORT 160-4.5 MCG/ACT inhaler, SMARTSIG:2 Puff(s) By Mouth Twice Daily, Disp: , Rfl:    tirzepatide  (MOUNJARO ) 2.5 MG/0.5ML Pen, Inject 2.5 mg into the skin once a week., Disp: 2 mL, Rfl: 0   Medications ordered in this encounter:  No orders of the defined types were placed in this encounter.    *If you need refills on other medications prior to your next appointment, please contact your pharmacy*  Follow-Up: Call back or seek an in-person evaluation if the symptoms worsen or if the condition fails to improve as anticipated.  [facility] [phone]  Other Instructions Absceso dental Dental Abscess  Un absceso dental es una zona de pus en una pieza dental o  a su alrededor. Se deriva de una infeccin. Puede causar dolor y otros sntomas. El tratamiento ayuda a paramedic los sntomas y automotive engineer que la infeccin se disemine. Cules son las causas? Esta afeccin es causada por una infeccin en el interior o alrededor de la pieza dental. Puede deberse a: Caries dentales en muy mal estado (caries). Una lesin dental grave, como una pieza dental rota o astillada. Qu incrementa el riesgo? El riesgo de marine scientist un absceso es mayor en los hombres. Adems, es ms probable que se manifieste en las personas que: Tienen  caries dentales. Tienen una enfermedad gingival muy grave. Comen refrigerios azucarados entre las comidas. Consumen tabaco. Tienen diabetes. Tienen debilitado el sistema que combate las enfermedades (sistema inmunitario). No se cepillan los dientes ni usan hilo dental con regularidad. Cules son los signos o sntomas? Estos son algunos sntomas leves: Dolor a la palpacin. Mal aliento. Lajune. Un sabor fuerte y market researcher. Dolor en la pieza dental infectada o alrededor de esta. Los sntomas graves de esta afeccin incluyen los siguientes: Ganglios del cuello inflamados. Escalofros. Secrecin de pus alrededor de la pieza dental. Hinchazn y enrojecimiento alrededor de la pieza dental, en la boca o en el rostro. Dolor muy fuerte dentro y alrededor de child psychotherapist. Los peores sntomas pueden ser: Dificultad para tragar. Dificultad para abrir government social research officer. Tener ganas de vomitar, o vomitar. Cmo se trata? El tratamiento de la afeccin consiste en eliminar la infeccin. Su dentista le explicar cmo puede hacerlo, incluyendo: Antibiticos. Enjuague bucal antibacteriano. Una incisin en el absceso para drenar el pus. Un tratamiento de conductos. La extraccin de la pieza dental. Siga estas instrucciones en su casa: Medicamentos Tome los medicamentos de venta libre y los recetados solamente como se lo haya indicado el dentista. Si le recetaron un antibitico, tmelo como se lo haya indicado el dentista. No deje de tomarlo aunque comience a sentirse mejor. Si le recetaron un gel que tiene un medicamento anestsico, selo exactamente como le hayan indicado. Pregunte al dentista si debe evitar conducir o usar mquinas mientras toma los medicamentos. Instrucciones generales Enjuguese la boca frecuentemente con una mezcla de agua con sal. Para preparar agua con sal, disuelva de  a 1 cucharadita (de 3 a 6 g) de sal en 1 taza (237 ml) de agua tibia. Consuma alimentos blandos  mientras la boca cicatriza. Beba suficiente lquido para radio producer pis (la orina) de color amarillo plido. No se aplique calor en la parte externa de la boca. No fume ni consuma ningn producto que contenga nicotina o tabaco. Si necesita ayuda para dejar de fumar, consulte al dentista. Concurra a todas las visitas de seguimiento. Prevencin de un absceso Cepllese los dientes todas las maanas y todas las noches. Use dentfrico con fluoruro. Utilice hilo dental carmax. Hgase limpiezas dentales con la frecuencia que le haya recomendado el dentista. Piense en la posibilidad de que le apliquen un sellador dental en cada pieza dental que tenga agujeros profundos (caries). Beba agua que tenga fluoruro. En la international business machines, el agua de grifo contiene fluoruro. Lea las etiquetas del agua embotellada para ver si tiene fluoruro. Beba agua en lugar de bebidas azucaradas. Ingiera comidas y refrigerios saludables. Use un protector bucal o un protector facial cuando haga deportes. Comunquese con un mdico si: El product/process development scientist, y los medicamentos no surten [place]. Solicite ayuda de inmediato si: Tiene fiebre o escalofros. Los sntomas empeoran repentinamente. Tiene un dolor de cabeza muy intenso. Tiene problemas  para respirar o tragar. Tiene dificultad para abrir government social research officer. Nota hinchazn en el cuello o cerca de los ojos. Estos sntomas pueden customer service manager. Solicite ayuda de inmediato. Comunquese con el servicio de emergencias de su localidad (911 en los [place]). No espere a ver si los sntomas desaparecen. No conduzca por sus propios medios [facility]. Resumen Un absceso dental es una zona de pus en una pieza dental o a su alrededor. Es causado por una infeccin. El tratamiento ayuda a paramedic los sntomas y automotive engineer que la infeccin se disemine. Tome los medicamentos de venta libre y los recetados solamente como se lo haya indicado el dentista. Para  prevenir un absceso, cudese mohawk industries. Cepllese los dientes todas las maanas y las noches. Psese hilo dental todos los das. Hgase limpiezas dentales con la frecuencia que le haya recomendado el dentista. Esta informacin no tiene theme park manager el consejo del mdico. Asegrese de hacerle al mdico cualquier pregunta que tenga. Document Revised: 01/17/2021 Document Reviewed: 01/17/2021 Elsevier Patient Education  2024 Elsevier Inc.   If you have been instructed to have an in-person evaluation today at a local Urgent Care facility, please use the link below. It will take you to a list of all of our available [facility], including address, phone number and hours of operation. Please do not delay care.  [facility]  If you or a family member do not have a primary care provider, use the link below to schedule a visit and establish care. When you choose a [facility] primary care physician or advanced practice provider, you gain a long-term partner in health. Find a Primary Care Provider  Learn more about [facility] in-office and virtual care options: [facility] - Get Care Now  "

## 2024-10-18 NOTE — Progress Notes (Signed)
 " Virtual Visit Consent   Sarah Watts, you are scheduled for a virtual visit with a [facility] provider today. Just as with appointments in the office, your consent must be obtained to participate. Your consent will be active for this visit and any virtual visit you may have with one of our providers in the next 365 days. If you have a MyChart account, a copy of this consent can be sent to you electronically.  As this is a virtual visit, video technology does not allow for your provider to perform a traditional examination. This may limit your provider's ability to fully assess your condition. If your provider identifies any concerns that need to be evaluated in person or the need to arrange testing (such as labs, EKG, etc.), we will make arrangements to do so. Although advances in technology are sophisticated, we cannot ensure that it will always work on either your end or our end. If the connection with a video visit is poor, the visit may have to be switched to a telephone visit. With either a video or telephone visit, we are not always able to ensure that we have a secure connection.  By engaging in this virtual visit, you consent to the provision of healthcare and authorize for your insurance to be billed (if applicable) for the services provided during this visit. Depending on your insurance coverage, you may receive a charge related to this service.  I need to obtain your verbal consent now. Are you willing to proceed with your visit today? Sarah Watts has provided verbal consent on 10/18/2024 for a virtual visit (video or telephone). Sarah Watts, [place]  Date: 10/18/2024 11:47 AM   Virtual Visit via Video Note   I, Sarah Watts, connected with  Sarah Watts  ([id number], 04/02/1966) on 10/18/2024 at 11:45 AM EST by a video-enabled telemedicine application and verified that I am speaking with the correct person using two identifiers.  Location: Patient: Virtual  Visit Location Patient: Home Provider: Virtual Visit Location Provider: Home Office   I discussed the limitations of evaluation and management by telemedicine and the availability of in person appointments. The patient expressed understanding and agreed to proceed.    History of Present Illness: Sarah Watts is a 58 y.o. who identifies as a female who was assigned female at birth, and is being seen today for pain and swelling of R lower bicuspid over the past week. Denies trauma or injury. Worsening over past few days with some swelling of R lower jaw. Denies fever, chills. Has dental provider but has not spoken to them yet. Able to fully open the jaw.   HPI: HPI  Problems:  Patient Active Problem List   Diagnosis Date Noted   Screening mammogram for breast cancer 03/30/2024   Encounter for general adult medical examination with abnormal findings 03/30/2024   Varicose veins of left lower extremity with pain 03/30/2024   Hyperparathyroidism 12/31/2023   Flu vaccine need 08/08/2023   Cervical cancer screening 08/07/2023   Intrinsic eczema 08/07/2023   Dyslipidemia, goal LDL below 100 04/02/2023   B12 deficiency 04/02/2023   Stage 3a chronic kidney disease (HCC) 04/02/2023   Type II diabetes mellitus with manifestations (HCC) 09/05/2022   Simple chronic bronchitis (HCC) 12/20/2020   Centrilobular emphysema (HCC) 06/14/2020   Encounter for screening for lung cancer 06/08/2020   Vitamin D  deficiency disease 10/08/2019   Current severe episode of major depressive disorder without psychotic features without prior episode (HCC)  03/18/2017   Tobacco abuse 12/01/2015   Visit for screening mammogram 02/13/2013   Spinal stenosis in cervical region 02/13/2013   Hypothyroid 01/18/2011   OBSTRUCTIVE SLEEP APNEA 01/09/2011   Essential hypertension 12/19/2010   Allergic rhinitis 12/19/2010    Allergies: Allergies[1] Medications: Current Medications[2]  Observations/Objective: Patient is  well-developed, well-nourished in no acute distress.  Resting comfortably  at home.  Head is normocephalic, atraumatic.  No labored breathing.  Speech is clear and coherent with logical content.  Patient is alert and oriented at baseline.   Assessment and Plan: 1. Dental infection (Primary) - amoxicillin -clavulanate (AUGMENTIN ) 875-125 MG tablet; Take 1 tablet by mouth 2 (two) times daily.  Dispense: 14 tablet; Refill: 0 - naproxen  (NAPROSYN ) 500 MG tablet; Take 1 tablet (500 mg total) by mouth 2 (two) times daily with a meal.  Dispense: 20 tablet; Refill: 0 - chlorhexidine  (PERIDEX ) 0.12 % solution; Use as directed 15 mLs in the mouth or throat 2 (two) times daily.  Dispense: 120 mL; Refill: 0  Supportive measures and OTC medications reviewed.  Augmentin  and Naprosyn  per orders. Start Peridex  rinse. She is to call her dental provider tomorrow morning to arrange follow-up.  Follow Up Instructions: I discussed the assessment and treatment plan with the patient. The patient was provided an opportunity to ask questions and all were answered. The patient agreed with the plan and demonstrated an understanding of the instructions.  A copy of instructions were sent to the patient via MyChart unless otherwise noted below.   The patient was advised to call back or seek an in-person evaluation if the symptoms worsen or if the condition fails to improve as anticipated.    Sarah Velma Lunger, PA-C     [1]  Allergies Allergen Reactions   Jardiance  [Empagliflozin ] Other (See Comments)    Vaginal yeast   Shellfish Allergy Nausea And Vomiting   Codeine Other (See Comments)    REACTION: tachycardia   Tramadol Nausea And Vomiting    Nausea and vomiting  [2]  Current Outpatient Medications:    amoxicillin -clavulanate (AUGMENTIN ) 875-125 MG tablet, Take 1 tablet by mouth 2 (two) times daily., Disp: 14 tablet, Rfl: 0   chlorhexidine  (PERIDEX ) 0.12 % solution, Use as directed 15 mLs in the mouth  or throat 2 (two) times daily., Disp: 120 mL, Rfl: 0   naproxen  (NAPROSYN ) 500 MG tablet, Take 1 tablet (500 mg total) by mouth 2 (two) times daily with a meal., Disp: 20 tablet, Rfl: 0   albuterol  (VENTOLIN  HFA) 108 (90 Base) MCG/ACT inhaler, Inhale 2 puffs into the lungs every 6 (six) hours as needed for wheezing or shortness of breath., Disp: 18 g, Rfl: 5   baclofen  (LIORESAL ) 10 MG tablet, Take 1 tablet (10 mg total) by mouth 3 (three) times daily., Disp: 30 each, Rfl: 0   budesonide-glycopyrrolate-formoterol (BREZTRI  AEROSPHERE) 160-9-4.8 MCG/ACT AERO inhaler, Inhale 2 puffs into the lungs 2 (two) times daily., Disp: 32.1 g, Rfl: 1   cyanocobalamin  2000 MCG tablet, Take 1 tablet (2,000 mcg total) by mouth daily., Disp: 90 tablet, Rfl: 0   EQ ASPIRIN LOW DOSE 81 MG chewable tablet, Chew 81 mg by mouth daily., Disp: , Rfl:    Ergocalciferol  50 MCG (2000 UT) CAPS, 1 capsule., Disp: , Rfl:    Esomeprazole  Magnesium (NEXIUM  PO), Take 1 tablet by mouth daily., Disp: , Rfl:    famotidine (PEPCID) 10 MG tablet, Take 10 mg by mouth., Disp: , Rfl:    fluocinonide -emollient (LIDEX -E) 0.05 % cream,  Apply 1 Application topically 2 (two) times daily., Disp: 60 g, Rfl: 2   fluticasone  (FLONASE ) 50 MCG/ACT nasal spray, Place 2 sprays into both nostrils daily., Disp: 16 g, Rfl: 0   irbesartan  (AVAPRO ) 300 MG tablet, Take 1 tablet (300 mg total) by mouth daily., Disp: 90 tablet, Rfl: 0   levocetirizine (XYZAL ) 5 MG tablet, TAKE 1 TABLET BY MOUTH ONCE DAILY IN THE EVENING, Disp: 90 tablet, Rfl: 0   levothyroxine  (SYNTHROID ) 75 MCG tablet, Take 1 tablet (75 mcg total) by mouth daily., Disp: 90 tablet, Rfl: 0   rosuvastatin  (CRESTOR ) 10 MG tablet, Take 1 tablet by mouth once daily, Disp: 90 tablet, Rfl: 0   spironolactone  (ALDACTONE ) 50 MG tablet, Take 1 tablet by mouth once daily, Disp: 90 tablet, Rfl: 0   SYMBICORT 160-4.5 MCG/ACT inhaler, SMARTSIG:2 Puff(s) By Mouth Twice Daily, Disp: , Rfl:   "

## 2024-11-26 ENCOUNTER — Encounter: Payer: Self-pay | Admitting: Internal Medicine
